# Patient Record
Sex: Male | Born: 1935 | Race: Black or African American | Hispanic: No | Marital: Married | Smoking: Former smoker
Health system: Southern US, Community
[De-identification: ages and names within clinical notes are randomized; demographics above are authoritative.]

## PROBLEM LIST (undated history)

## (undated) DIAGNOSIS — H43393 Other vitreous opacities, bilateral: Secondary | ICD-10-CM

## (undated) DIAGNOSIS — I252 Old myocardial infarction: Secondary | ICD-10-CM

## (undated) DIAGNOSIS — I219 Acute myocardial infarction, unspecified: Secondary | ICD-10-CM

## (undated) DIAGNOSIS — M898X9 Other specified disorders of bone, unspecified site: Secondary | ICD-10-CM

## (undated) DIAGNOSIS — H52203 Unspecified astigmatism, bilateral: Secondary | ICD-10-CM

## (undated) DIAGNOSIS — C61 Malignant neoplasm of prostate: Secondary | ICD-10-CM

## (undated) DIAGNOSIS — E1122 Type 2 diabetes mellitus with diabetic chronic kidney disease: Secondary | ICD-10-CM

## (undated) DIAGNOSIS — H02831 Dermatochalasis of right upper eyelid: Secondary | ICD-10-CM

## (undated) DIAGNOSIS — H5203 Hypermetropia, bilateral: Secondary | ICD-10-CM

## (undated) DIAGNOSIS — M9080 Osteopathy in diseases classified elsewhere, unspecified site: Secondary | ICD-10-CM

## (undated) DIAGNOSIS — K219 Gastro-esophageal reflux disease without esophagitis: Secondary | ICD-10-CM

## (undated) DIAGNOSIS — N183 Chronic kidney disease, stage 3 unspecified: Secondary | ICD-10-CM

## (undated) DIAGNOSIS — E559 Vitamin D deficiency, unspecified: Secondary | ICD-10-CM

## (undated) DIAGNOSIS — E876 Hypokalemia: Secondary | ICD-10-CM

## (undated) DIAGNOSIS — I6350 Cerebral infarction due to unspecified occlusion or stenosis of unspecified cerebral artery: Secondary | ICD-10-CM

## (undated) DIAGNOSIS — E0590 Thyrotoxicosis, unspecified without thyrotoxic crisis or storm: Secondary | ICD-10-CM

## (undated) DIAGNOSIS — I10 Essential (primary) hypertension: Secondary | ICD-10-CM

## (undated) DIAGNOSIS — H2513 Age-related nuclear cataract, bilateral: Secondary | ICD-10-CM

## (undated) DIAGNOSIS — I129 Hypertensive chronic kidney disease with stage 1 through stage 4 chronic kidney disease, or unspecified chronic kidney disease: Secondary | ICD-10-CM

## (undated) DIAGNOSIS — I4891 Unspecified atrial fibrillation: Secondary | ICD-10-CM

## (undated) DIAGNOSIS — E0520 Thyrotoxicosis with toxic multinodular goiter without thyrotoxic crisis or storm: Secondary | ICD-10-CM

## (undated) DIAGNOSIS — I2510 Atherosclerotic heart disease of native coronary artery without angina pectoris: Secondary | ICD-10-CM

## (undated) DIAGNOSIS — H524 Presbyopia: Secondary | ICD-10-CM

## (undated) DIAGNOSIS — Z85048 Personal history of other malignant neoplasm of rectum, rectosigmoid junction, and anus: Secondary | ICD-10-CM

## (undated) DIAGNOSIS — I519 Heart disease, unspecified: Secondary | ICD-10-CM

## (undated) DIAGNOSIS — E1165 Type 2 diabetes mellitus with hyperglycemia: Secondary | ICD-10-CM

## (undated) DIAGNOSIS — E785 Hyperlipidemia, unspecified: Secondary | ICD-10-CM

## (undated) DIAGNOSIS — H02834 Dermatochalasis of left upper eyelid: Secondary | ICD-10-CM

## (undated) DIAGNOSIS — E889 Metabolic disorder, unspecified: Secondary | ICD-10-CM

## (undated) DIAGNOSIS — H25013 Cortical age-related cataract, bilateral: Secondary | ICD-10-CM

## (undated) DIAGNOSIS — I5023 Acute on chronic systolic (congestive) heart failure: Secondary | ICD-10-CM

## (undated) DIAGNOSIS — G459 Transient cerebral ischemic attack, unspecified: Secondary | ICD-10-CM

## (undated) DIAGNOSIS — D631 Anemia in chronic kidney disease: Secondary | ICD-10-CM

## (undated) HISTORY — DX: Metabolic disorder, unspecified: E88.9

## (undated) HISTORY — DX: Essential (primary) hypertension: I10

## (undated) HISTORY — DX: Chronic kidney disease, stage 3 (moderate): N18.3

## (undated) HISTORY — DX: Osteopathy in diseases classified elsewhere, unspecified site: M90.80

## (undated) HISTORY — DX: Old myocardial infarction: I25.2

## (undated) HISTORY — DX: Other vitreous opacities, bilateral: H43.393

## (undated) HISTORY — DX: Hypercalcemia: E83.52

## (undated) HISTORY — DX: Atherosclerotic heart disease of native coronary artery without angina pectoris: I25.10

## (undated) HISTORY — DX: Thyrotoxicosis with toxic multinodular goiter without thyrotoxic crisis or storm: E05.20

## (undated) HISTORY — DX: Anemia in chronic kidney disease: D63.1

## (undated) HISTORY — PX: GASTROSTOMY TUBE PLACEMENT: SHX655

## (undated) HISTORY — DX: Hypokalemia: E87.6

## (undated) HISTORY — DX: Hypermetropia, bilateral: H52.203

## (undated) HISTORY — DX: Hyperlipidemia, unspecified: E78.5

## (undated) HISTORY — PX: COLOPROCTECTOMY: SHX919

## (undated) HISTORY — DX: Unspecified atrial fibrillation: I48.91

## (undated) HISTORY — PX: CORONARY ARTERY BYPASS GRAFT: SHX141

## (undated) HISTORY — DX: Type 2 diabetes mellitus with diabetic chronic kidney disease: E11.22

## (undated) HISTORY — DX: Cerebral infarction due to unspecified occlusion or stenosis of unspecified cerebral artery: I63.50

## (undated) HISTORY — DX: Cortical age-related cataract, bilateral: H25.013

## (undated) HISTORY — DX: Gastro-esophageal reflux disease without esophagitis: K21.9

## (undated) HISTORY — DX: Heart disease, unspecified: I51.9

## (undated) HISTORY — DX: Presbyopia: H52.4

## (undated) HISTORY — DX: Personal history of other malignant neoplasm of rectum, rectosigmoid junction, and anus: Z85.048

## (undated) HISTORY — DX: Vitamin D deficiency, unspecified: E55.9

## (undated) HISTORY — DX: Chronic kidney disease, stage 3 unspecified: N18.30

## (undated) HISTORY — DX: Type 2 diabetes mellitus with hyperglycemia: E11.65

## (undated) HISTORY — DX: Dermatochalasis of right upper eyelid: H02.831

## (undated) HISTORY — DX: Acute on chronic systolic (congestive) heart failure: I50.23

## (undated) HISTORY — DX: Hypermetropia, bilateral: H52.03

## (undated) HISTORY — DX: Malignant neoplasm of prostate: C61

## (undated) HISTORY — DX: Transient cerebral ischemic attack, unspecified: G45.9

## (undated) HISTORY — PX: CARDIAC SURGERY: SHX584

## (undated) HISTORY — DX: Hypertensive chronic kidney disease with stage 1 through stage 4 chronic kidney disease, or unspecified chronic kidney disease: I12.9

## (undated) HISTORY — DX: Age-related nuclear cataract, bilateral: H25.13

## (undated) HISTORY — DX: Dermatochalasis of left upper eyelid: H02.834

## (undated) HISTORY — DX: Acute myocardial infarction, unspecified: I21.9

## (undated) HISTORY — PX: TONSILLECTOMY: SUR1361

## (undated) HISTORY — DX: Other specified disorders of bone, unspecified site: M89.8X9

## (undated) HISTORY — PX: INGUINAL HERNIA REPAIR: SUR1180

## (undated) HISTORY — DX: Thyrotoxicosis, unspecified without thyrotoxic crisis or storm: E05.90

---

## 2018-03-14 ENCOUNTER — Non-Acute Institutional Stay (SKILLED_NURSING_FACILITY): Payer: Medicare Other | Admitting: Internal Medicine

## 2018-03-14 ENCOUNTER — Encounter: Payer: Self-pay | Admitting: Internal Medicine

## 2018-03-14 DIAGNOSIS — E1122 Type 2 diabetes mellitus with diabetic chronic kidney disease: Secondary | ICD-10-CM

## 2018-03-14 DIAGNOSIS — R531 Weakness: Secondary | ICD-10-CM

## 2018-03-14 DIAGNOSIS — E785 Hyperlipidemia, unspecified: Secondary | ICD-10-CM

## 2018-03-14 DIAGNOSIS — I6930 Unspecified sequelae of cerebral infarction: Secondary | ICD-10-CM

## 2018-03-14 DIAGNOSIS — I10 Essential (primary) hypertension: Secondary | ICD-10-CM

## 2018-03-14 DIAGNOSIS — E1169 Type 2 diabetes mellitus with other specified complication: Secondary | ICD-10-CM

## 2018-03-14 DIAGNOSIS — I482 Chronic atrial fibrillation, unspecified: Secondary | ICD-10-CM | POA: Diagnosis not present

## 2018-03-14 DIAGNOSIS — D72825 Bandemia: Secondary | ICD-10-CM

## 2018-03-14 DIAGNOSIS — E1159 Type 2 diabetes mellitus with other circulatory complications: Secondary | ICD-10-CM

## 2018-03-14 NOTE — Progress Notes (Addendum)
:  Location:  [facility] Room Number: 413K Place of Service:  SNF (31)  Merideth Bosque D. Sheppard Coil, MD  Patient Care Team: Hennie Duos, MD as PCP - General (Internal Medicine)  Extended Emergency Contact Information Primary Emergency Contact: marino, rogerson Mobile Phone: [phone] Relation: Spouse     Allergies: Patient has no known allergies.  Chief Complaint  Patient presents with   New Admit To SNF    Admit to [facility]    HPI: Patient is 85 y.o. 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  When patient got home he could not get out of his wheelchair and so returned again to the ED.  CT scans earlier had shown encephalomalacia and prior CVA.  Patient has dysarthric speech at baseline.  He had right upper extremity weakness and bilateral lower extremity weakness as baseline.  He denied worsening speech changes but did endorse lower extremity weakness.  Patient was admitted to [facility] from 3/5-10 for leukocytosis and worsening right-sided weakness.  Patient with a white count of WBC and possible UTI, treated with a full course of Rocephin despite the fact that urine culture did not grow out anything.  Worsening right-sided weakness was negative for acute stroke and daily MRI of the brain.  Patient did have acute on chronic metabolic encephalopathy which improved and.  All other problems been near are at their baseline patient is admitted to skilled nursing facility for OT/PT.  While at skilled nursing facility patient will be followed for chronic atrial fib treated with metoprolol and Xarelto as well as Cardizem, hypertension treated with diltiazem and Toprol-XL,  history of stroke treated with Xarelto and aspirin.  Past Medical History:  Diagnosis Date   Acute on chronic systolic (congestive) heart failure (HCC)    Anemia due to stage 3 chronic kidney disease (HCC)    Arteriosclerotic coronary artery disease    Atrial fibrillation (HCC)    Benign hypertension with chronic kidney disease, stage III (HCC)    Cerebral artery occlusion with cerebral infarction (HCC)    Cortical age-related cataract of both eyes    Dermatochalasis of both upper eyelids    Diabetes mellitus with stage 3 chronic kidney disease (HCC)    Gastroesophageal reflux disease without esophagitis    History of heart attack    Hypercalcemia    Hyperlipidemia    Hyperopia with astigmatism and presbyopia, bilateral    Hypertension with goal to be determined    Hyperthyroidism    Hypokalemia    LV dysfunction    Malignant neoplasm of prostate ([place])    Metabolic bone disease    Myocardial infarction [facility])    Nuclear sclerotic cataract of both eyes    Personal history of rectal cancer    Toxic multinodular goiter    Toxic multinodular goiter    Transient ischemic attack    Type 2 diabetes mellitus with hyperglycemia ([place])    Vitamin D deficiency    Vitreous floaters of both eyes       Allergies as of 03/14/2018   No Known Allergies     Medication List       Accurate as of March 14, 2018  3:11 PM. Always use your most  recent med list.        aspirin EC 81 MG tablet Take 81 mg by mouth daily.   atorvastatin 10 MG tablet Commonly known as:  LIPITOR Take 10 mg by mouth daily.   cholecalciferol 25 MCG (1000 UT) tablet Commonly known as:  VITAMIN D Take 1,000 Units by mouth daily.   diltiazem 120 MG tablet Commonly known as:  CARDIZEM Take 120 mg by mouth daily.   ferrous sulfate 325 (65 FE) MG tablet Take 325 mg by mouth daily with breakfast.   glipiZIDE 10 MG 24 hr tablet Commonly known as:  GLUCOTROL XL Take 10 mg by mouth  daily with breakfast.   insulin lispro 100 UNIT/ML injection Commonly known as:  HUMALOG Inject into the skin. Inject 2-12 units into the skin 3 (times) daily after meals.   metoprolol succinate 25 MG 24 hr tablet Commonly known as:  TOPROL-XL Take 25 mg by mouth daily.   pantoprazole 40 MG tablet Commonly known as:  PROTONIX Take 40 mg by mouth daily.   Xarelto 20 MG Tabs tablet Generic drug:  rivaroxaban Take 20 mg by mouth daily with supper.       No orders of the defined types were placed in this encounter.    There is no immunization history on file for this patient.  Social History   Tobacco Use   Smoking status: Former Smoker   Smokeless tobacco: Never Used  Substance Use Topics   Alcohol use: Not on file    Family history is   Family History  Problem Relation Age of Onset   Diabetes Sister    Stroke Sister    Hypertension Sister       Review of Systems  DATA OBTAINED: from patient, nurse GENERAL:  no fevers, fatigue, appetite changes SKIN: No itching, or rash EYES: No eye pain, redness, discharge EARS: No earache, tinnitus, change in hearing NOSE: No congestion, drainage or bleeding  MOUTH/THROAT: No mouth or tooth pain, No sore throat RESPIRATORY: No cough, wheezing, SOB CARDIAC: No chest pain, palpitations, lower extremity edema  GI: No abdominal pain, No N/V/D or constipation, No heartburn or reflux  GU: No dysuria, frequency or urgency, or incontinence  MUSCULOSKELETAL: No unrelieved bone/joint pain NEUROLOGIC: No headache, dizziness or focal weakness PSYCHIATRIC: No c/o anxiety or sadness   Vitals:   03/14/18 1457  BP: 138/81  Pulse: 75  Resp: 18  Temp: 99 F (37.2 C)    SpO2 Readings from Last 1 Encounters:  No data found for SpO2   Body mass index is 32.69 kg/m.     Physical Exam  GENERAL APPEARANCE: Alert, conversant,  No acute distress.  SKIN: No diaphoresis rash HEAD: Normocephalic, atraumatic  EYES:  Conjunctiva/lids clear. Pupils round, reactive. EOMs intact.  EARS: External exam WNL, canals clear. Hearing grossly normal.  NOSE: No deformity or discharge.  MOUTH/THROAT: Lips w/o lesions  RESPIRATORY: Breathing is even, unlabored. Lung sounds are clear   CARDIOVASCULAR: Heart irreg no murmurs, rubs or gallops. No peripheral edema.   GASTROINTESTINAL: Abdomen is soft, non-tender, not distended w/ normal bowel sounds. GENITOURINARY: Bladder non tender, not distended  MUSCULOSKELETAL: No abnormal joints or musculature NEUROLOGIC:  Cranial nerves 2-12 grossly intact except for dysarthria from prior stroke; strength; right-sided weakness PSYCHIATRIC: Mood and affect appropriate to situation, no behavioral issues  There are no active problems to display for this patient.     Labs reviewed: Basic Metabolic Panel: No results found for: NA, K, CL, CO2,  GLUCOSE, BUN, CREATININE, CALCIUM, PROT, ALBUMIN, AST, ALT, ALKPHOS, BILITOT, GFRNONAA, GFRAA  No results for input(s): NA, K, CL, CO2, GLUCOSE, BUN, CREATININE, CALCIUM, MG, PHOS in the last 8760 hours. Liver Function Tests: No results for input(s): AST, ALT, ALKPHOS, BILITOT, PROT, ALBUMIN in the last 8760 hours. No results for input(s): LIPASE, AMYLASE in the last 8760 hours. No results for input(s): AMMONIA in the last 8760 hours. CBC: No results for input(s): WBC, NEUTROABS, HGB, HCT, MCV, PLT in the last 8760 hours. Lipid No results for input(s): CHOL, HDL, LDLCALC, TRIG in the last 8760 hours.  Cardiac Enzymes: No results for input(s): CKTOTAL, CKMB, CKMBINDEX, TROPONINI in the last 8760 hours. BNP: No results for input(s): BNP in the last 8760 hours. No results found for: MICROALBUR No results found for: HGBA1C No results found for: TSH No results found for: VITAMINB12 No results found for: FOLATE No results found for: IRON, TIBC, FERRITIN  Imaging and Procedures obtained prior to SNF admission: Patient was never  admitted.   Not all labs, radiology exams or other studies done during hospitalization come through on my EPIC note; however they are reviewed by me.    Assessment and Plan  Worsening right-sided weakness/history of CVA- patient with known CVA in the past work-up was negative for acute stroke including MRI SNF- patient is admitted for OT/PT; continue Xarelto 20 mg daily and ASA 81 mg daily  Leukocytosis- WBC 18,000 in ED; patient was started on Rocephin empirically for possible UTI which did not grow in culture but Rocephin was continued for full course  Chronic atrial fibrillation SNF- continue diltiazem 120 mg daily metoprolol 25 mg daily and Xarelto 20 mg daily as prophylaxis  Hypertension SNF- continue diltiazem 120 mg daily, metoprolol 25 mg XL daily.  Diabetes mellitus patient was treated with insulin while in the hospital SNF- patient restarted on glipizide 10 mg daily sliding scale insulin for coverage; plan to DC if possible; patient is on statin  Hyperlipidemia SNF- not stated as uncontrolled; continue Lipitor 10 mg daily    Time spent greater than 45 minutes;> 50% of time with patient was spent reviewing records, labs, tests and studies, counseling and developing plan of care  Webb Silversmith D. Sheppard Coil, MD

## 2018-03-17 ENCOUNTER — Encounter: Payer: Self-pay | Admitting: Internal Medicine

## 2018-03-17 DIAGNOSIS — I4891 Unspecified atrial fibrillation: Secondary | ICD-10-CM | POA: Insufficient documentation

## 2018-03-17 DIAGNOSIS — E119 Type 2 diabetes mellitus without complications: Secondary | ICD-10-CM | POA: Insufficient documentation

## 2018-03-17 DIAGNOSIS — E1159 Type 2 diabetes mellitus with other circulatory complications: Secondary | ICD-10-CM | POA: Insufficient documentation

## 2018-03-17 DIAGNOSIS — I6930 Unspecified sequelae of cerebral infarction: Secondary | ICD-10-CM | POA: Insufficient documentation

## 2018-03-17 DIAGNOSIS — I10 Essential (primary) hypertension: Secondary | ICD-10-CM

## 2018-03-17 DIAGNOSIS — D72829 Elevated white blood cell count, unspecified: Secondary | ICD-10-CM | POA: Insufficient documentation

## 2018-03-17 DIAGNOSIS — R531 Weakness: Secondary | ICD-10-CM | POA: Insufficient documentation

## 2018-03-17 DIAGNOSIS — I482 Chronic atrial fibrillation, unspecified: Secondary | ICD-10-CM | POA: Insufficient documentation

## 2018-03-17 DIAGNOSIS — E785 Hyperlipidemia, unspecified: Secondary | ICD-10-CM

## 2018-03-17 DIAGNOSIS — E1169 Type 2 diabetes mellitus with other specified complication: Secondary | ICD-10-CM | POA: Insufficient documentation

## 2018-04-04 ENCOUNTER — Other Ambulatory Visit: Payer: Self-pay | Admitting: Adult Health

## 2018-04-04 ENCOUNTER — Non-Acute Institutional Stay (SKILLED_NURSING_FACILITY): Payer: Medicare Other | Admitting: Adult Health

## 2018-04-04 ENCOUNTER — Encounter: Payer: Self-pay | Admitting: Adult Health

## 2018-04-04 DIAGNOSIS — E1169 Type 2 diabetes mellitus with other specified complication: Secondary | ICD-10-CM | POA: Diagnosis not present

## 2018-04-04 DIAGNOSIS — E785 Hyperlipidemia, unspecified: Secondary | ICD-10-CM

## 2018-04-04 DIAGNOSIS — E1159 Type 2 diabetes mellitus with other circulatory complications: Secondary | ICD-10-CM

## 2018-04-04 DIAGNOSIS — R531 Weakness: Secondary | ICD-10-CM

## 2018-04-04 DIAGNOSIS — I10 Essential (primary) hypertension: Secondary | ICD-10-CM

## 2018-04-04 DIAGNOSIS — I482 Chronic atrial fibrillation, unspecified: Secondary | ICD-10-CM | POA: Diagnosis not present

## 2018-04-04 DIAGNOSIS — E1122 Type 2 diabetes mellitus with diabetic chronic kidney disease: Secondary | ICD-10-CM

## 2018-04-04 MED ORDER — GLIPIZIDE ER 10 MG PO TB24: 10.0000 mg | ORAL_TABLET | Freq: Every day | ORAL | 0 refills | Status: DC

## 2018-04-04 MED ORDER — FERROUS SULFATE 325 (65 FE) MG PO TABS: 325.0000 mg | ORAL_TABLET | Freq: Every day | ORAL | 0 refills | Status: DC

## 2018-04-04 MED ORDER — RIVAROXABAN 20 MG PO TABS: 20.0000 mg | ORAL_TABLET | Freq: Every day | ORAL | 0 refills | Status: DC

## 2018-04-04 MED ORDER — INSULIN LISPRO 100 UNIT/ML ~~LOC~~ SOLN: 3.0000 [IU] | Freq: Three times a day (TID) | SUBCUTANEOUS | 0 refills | Status: DC

## 2018-04-04 MED ORDER — DILTIAZEM HCL 120 MG PO TABS: 120.0000 mg | ORAL_TABLET | Freq: Every day | ORAL | 0 refills | Status: DC

## 2018-04-04 MED ORDER — METOPROLOL SUCCINATE ER 25 MG PO TB24: 25.0000 mg | ORAL_TABLET | Freq: Every day | ORAL | 0 refills | Status: DC

## 2018-04-04 MED ORDER — ATORVASTATIN CALCIUM 10 MG PO TABS: 10.0000 mg | ORAL_TABLET | Freq: Every day | ORAL | 0 refills | Status: AC

## 2018-04-04 NOTE — Progress Notes (Signed)
Location:    [facility] Room Number: 229N Place of Service:  SNF (31)    CODE STATUS: 102P  No Known Allergies  Chief Complaint  Patient presents with  . Discharge Note    Discharge from [facility]     HPI:   He is being discharged to home with home health for pt/ot/rn. He will need a front wheel walker. He will need his prescriptions written and will need to follow up with her medical provider. He had been hospitalized for worsening weakness. He was admitted to this facility for short term rehab. He is read to complete his therapy on a home health basis.    Past Medical History:  Diagnosis Date  . Acute on chronic systolic (congestive) heart failure ([place])   . Anemia due to stage 3 chronic kidney disease ([place])   . Arteriosclerotic coronary artery disease   . Atrial fibrillation ([place])   . Benign hypertension with chronic kidney disease, stage III ([place])   . Cerebral artery occlusion with cerebral infarction ([place])   . Cortical age-related cataract of both eyes   . Dermatochalasis of both upper eyelids   . Diabetes mellitus with stage 3 chronic kidney disease ([place])   . Gastroesophageal reflux disease without esophagitis   . History of heart attack   . Hypercalcemia   . Hyperlipidemia   . Hyperopia with astigmatism and presbyopia, bilateral   . Hypertension with goal to be determined   . Hyperthyroidism   . Hypokalemia   . LV dysfunction   . Malignant neoplasm of prostate ([place])   . Metabolic bone disease   . Myocardial infarction ([place])   . Nuclear sclerotic cataract of both eyes   . Personal history of rectal cancer   . Toxic multinodular goiter   . Toxic multinodular goiter   . Transient ischemic attack   . Type 2 diabetes mellitus with hyperglycemia ([place])   . Vitamin D deficiency   . Vitreous floaters of both eyes     Past Surgical History:  Procedure Laterality Date  . CARDIAC SURGERY    . COLOPROCTECTOMY    . CORONARY  ARTERY BYPASS GRAFT    . GASTROSTOMY TUBE PLACEMENT    . INGUINAL HERNIA REPAIR    . TONSILLECTOMY      Social History   Socioeconomic History  . Marital status: Married    Spouse name: Not on file  . Number of children: Not on file  . Years of education: Not on file  . Highest education level: Not on file  Occupational History  . Not on file  Social Needs  . Financial resource strain: Not on file  . Food insecurity:    Worry: Not on file    Inability: Not on file  . Transportation needs:    Medical: Not on file    Non-medical: Not on file  Tobacco Use  . Smoking status: Former Research scientist (life sciences)  . Smokeless tobacco: Never Used  Substance and Sexual Activity  . Alcohol use: Not on file  . Drug use: Not on file  . Sexual activity: Not on file  Lifestyle  . Physical activity:    Days per week: Not on file    Minutes per session: Not on file  . Stress: Not on file  Relationships  . Social connections:    Talks on phone: Not on file    Gets together: Not on file    Attends religious service: Not on  file    Active member of club or organization: Not on file    Attends meetings of clubs or organizations: Not on file    Relationship status: Not on file  . Intimate partner violence:    Fear of current or ex partner: Not on file    Emotionally abused: Not on file    Physically abused: Not on file    Forced sexual activity: Not on file  Other Topics Concern  . Not on file  Social History Narrative  . Not on file   Family History  Problem Relation Age of Onset  . Diabetes Sister   . Stroke Sister   . Hypertension Sister     VITAL SIGNS BP 134/64   Pulse 64   Temp 98 F (36.7 C)   Resp 18   Ht 6' (1.829 m)   Wt 241 lb (109.3 kg)   BMI 32.69 kg/m   Patient's Medications  New Prescriptions   No medications on file  Previous Medications   ASPIRIN EC 81 MG TABLET    Take 81 mg by mouth daily.   ATORVASTATIN (LIPITOR) 10 MG TABLET    Take 1 tablet (10 mg total) by  mouth daily.   CHOLECALCIFEROL (VITAMIN D) 25 MCG (1000 UT) TABLET    Take 1,000 Units by mouth daily.   DILTIAZEM (CARDIZEM) 120 MG TABLET    Take 1 tablet (120 mg total) by mouth daily.   FERROUS SULFATE 325 (65 FE) MG TABLET    Take 1 tablet (325 mg total) by mouth daily with breakfast.   GLIPIZIDE (GLUCOTROL XL) 10 MG 24 HR TABLET    Take 1 tablet (10 mg total) by mouth daily with breakfast.   INSULIN LISPRO (HUMALOG) 100 UNIT/ML INJECTION    Inject 0.03-0.15 mLs (3-15 Units total) into the skin 3 (three) times daily with meals. SSI: 151-200: 3 units; 201-250: 5 units; 251-300: 8 units; 301-350: 11 units; 351-400: 15 units   METOPROLOL SUCCINATE (TOPROL-XL) 25 MG 24 HR TABLET    Take 1 tablet (25 mg total) by mouth daily.   PANTOPRAZOLE (PROTONIX) 40 MG TABLET    Take 40 mg by mouth daily.   RIVAROXABAN (XARELTO) 20 MG TABS TABLET    Take 1 tablet (20 mg total) by mouth daily with supper.  Modified Medications   No medications on file  Discontinued Medications   No medications on file     SIGNIFICANT DIAGNOSTIC EXAMS  NONE RECENT.   Review of Systems  Constitutional: Negative for malaise/fatigue.  Respiratory: Negative for cough and shortness of breath.   Cardiovascular: Negative for chest pain, palpitations and leg swelling.  Gastrointestinal: Negative for abdominal pain, constipation and heartburn.  Musculoskeletal: Negative for back pain, joint pain and myalgias.  Skin: Negative.   Neurological: Negative for dizziness.  Psychiatric/Behavioral: The patient is not nervous/anxious.    Physical Exam Constitutional:      General: He is not in acute distress.    Appearance: He is well-developed. He is not diaphoretic.  Neck:     Musculoskeletal: Neck supple.     Thyroid: No thyromegaly.  Cardiovascular:     Rate and Rhythm: Normal rate and regular rhythm.     Pulses: Normal pulses.     Heart sounds: Normal heart sounds.  Pulmonary:     Effort: Pulmonary effort is normal. No  respiratory distress.     Breath sounds: Normal breath sounds.  Abdominal:     General: Bowel sounds are normal.  There is no distension.     Palpations: Abdomen is soft.     Tenderness: There is no abdominal tenderness.  Musculoskeletal:     Right lower leg: No edema.     Left lower leg: No edema.     Comments: Is able to move all extremities Has right side weakness   Lymphadenopathy:     Cervical: No cervical adenopathy.  Skin:    General: Skin is warm and dry.  Neurological:     Mental Status: He is alert. Mental status is at baseline.     Comments: Dysarthria   Psychiatric:        Mood and Affect: Mood normal.      ASSESSMENT/ PLAN:  Patient is being discharged with the following home health services: pt/ot/rn: to evaluate and treat as indicated for gait balance strength adl training medication management.    Patient is being discharged with the following durable medical equipment:  Front wheel walker to allow him to maintain his current level of independence with his adls.    Patient has been advised to f/u with their PCP in 1-2 weeks to bring them up to date on their rehab stay.  Social services at facility was responsible for arranging this appointment.  Pt was provided with a 30 day supply of prescriptions for medications and refills must be obtained from their PCP.  For controlled substances, a more limited supply may be provided adequate until PCP appointment only.  A 30 day supply of his medications have been sent to [facility] in [place] [address].   Time spent with patient and discharge process: 40 minutes: home health; dme and medications.     Ok Edwards NP [facility]  Contact [phone] Monday through Friday 8am- 5pm  After hours call [phone]

## 2020-05-30 ENCOUNTER — Encounter (HOSPITAL_COMMUNITY): Payer: Self-pay

## 2020-05-30 ENCOUNTER — Observation Stay (HOSPITAL_COMMUNITY): Payer: Medicare Other

## 2020-05-30 ENCOUNTER — Emergency Department (HOSPITAL_COMMUNITY): Payer: Medicare Other

## 2020-05-30 ENCOUNTER — Inpatient Hospital Stay (HOSPITAL_COMMUNITY)
Admission: EM | Admit: 2020-05-30 | Discharge: 2020-06-03 | DRG: 064 | Disposition: A | Discharge status: Rehabilitation Facility | Payer: Medicare Other | Attending: Internal Medicine | Admitting: Internal Medicine

## 2020-05-30 ENCOUNTER — Other Ambulatory Visit: Payer: Self-pay

## 2020-05-30 DIAGNOSIS — Z8546 Personal history of malignant neoplasm of prostate: Secondary | ICD-10-CM

## 2020-05-30 DIAGNOSIS — H02834 Dermatochalasis of left upper eyelid: Secondary | ICD-10-CM | POA: Diagnosis present

## 2020-05-30 DIAGNOSIS — R531 Weakness: Secondary | ICD-10-CM

## 2020-05-30 DIAGNOSIS — K219 Gastro-esophageal reflux disease without esophagitis: Secondary | ICD-10-CM | POA: Diagnosis present

## 2020-05-30 DIAGNOSIS — I4891 Unspecified atrial fibrillation: Secondary | ICD-10-CM

## 2020-05-30 DIAGNOSIS — I255 Ischemic cardiomyopathy: Secondary | ICD-10-CM | POA: Diagnosis present

## 2020-05-30 DIAGNOSIS — E8889 Other specified metabolic disorders: Secondary | ICD-10-CM | POA: Diagnosis present

## 2020-05-30 DIAGNOSIS — R4781 Slurred speech: Secondary | ICD-10-CM | POA: Diagnosis present

## 2020-05-30 DIAGNOSIS — D631 Anemia in chronic kidney disease: Secondary | ICD-10-CM | POA: Diagnosis present

## 2020-05-30 DIAGNOSIS — Z79899 Other long term (current) drug therapy: Secondary | ICD-10-CM

## 2020-05-30 DIAGNOSIS — Z933 Colostomy status: Secondary | ICD-10-CM

## 2020-05-30 DIAGNOSIS — R270 Ataxia, unspecified: Secondary | ICD-10-CM | POA: Diagnosis present

## 2020-05-30 DIAGNOSIS — R29709 NIHSS score 9: Secondary | ICD-10-CM | POA: Diagnosis present

## 2020-05-30 DIAGNOSIS — I6389 Other cerebral infarction: Secondary | ICD-10-CM | POA: Diagnosis not present

## 2020-05-30 DIAGNOSIS — E785 Hyperlipidemia, unspecified: Secondary | ICD-10-CM | POA: Diagnosis present

## 2020-05-30 DIAGNOSIS — Z8249 Family history of ischemic heart disease and other diseases of the circulatory system: Secondary | ICD-10-CM

## 2020-05-30 DIAGNOSIS — Z85048 Personal history of other malignant neoplasm of rectum, rectosigmoid junction, and anus: Secondary | ICD-10-CM

## 2020-05-30 DIAGNOSIS — Z823 Family history of stroke: Secondary | ICD-10-CM

## 2020-05-30 DIAGNOSIS — E669 Obesity, unspecified: Secondary | ICD-10-CM | POA: Diagnosis present

## 2020-05-30 DIAGNOSIS — I63449 Cerebral infarction due to embolism of unspecified cerebellar artery: Secondary | ICD-10-CM | POA: Diagnosis present

## 2020-05-30 DIAGNOSIS — E1122 Type 2 diabetes mellitus with diabetic chronic kidney disease: Secondary | ICD-10-CM | POA: Diagnosis present

## 2020-05-30 DIAGNOSIS — H02831 Dermatochalasis of right upper eyelid: Secondary | ICD-10-CM | POA: Diagnosis present

## 2020-05-30 DIAGNOSIS — I639 Cerebral infarction, unspecified: Secondary | ICD-10-CM | POA: Diagnosis present

## 2020-05-30 DIAGNOSIS — E039 Hypothyroidism, unspecified: Secondary | ICD-10-CM | POA: Diagnosis present

## 2020-05-30 DIAGNOSIS — R29810 Facial weakness: Secondary | ICD-10-CM | POA: Diagnosis present

## 2020-05-30 DIAGNOSIS — I5030 Unspecified diastolic (congestive) heart failure: Secondary | ICD-10-CM

## 2020-05-30 DIAGNOSIS — H25813 Combined forms of age-related cataract, bilateral: Secondary | ICD-10-CM | POA: Diagnosis present

## 2020-05-30 DIAGNOSIS — I495 Sick sinus syndrome: Secondary | ICD-10-CM | POA: Diagnosis present

## 2020-05-30 DIAGNOSIS — I4819 Other persistent atrial fibrillation: Secondary | ICD-10-CM | POA: Diagnosis present

## 2020-05-30 DIAGNOSIS — Z951 Presence of aortocoronary bypass graft: Secondary | ICD-10-CM

## 2020-05-30 DIAGNOSIS — Z7982 Long term (current) use of aspirin: Secondary | ICD-10-CM

## 2020-05-30 DIAGNOSIS — I2510 Atherosclerotic heart disease of native coronary artery without angina pectoris: Secondary | ICD-10-CM | POA: Diagnosis present

## 2020-05-30 DIAGNOSIS — L8990 Pressure ulcer of unspecified site, unspecified stage: Secondary | ICD-10-CM | POA: Insufficient documentation

## 2020-05-30 DIAGNOSIS — I130 Hypertensive heart and chronic kidney disease with heart failure and stage 1 through stage 4 chronic kidney disease, or unspecified chronic kidney disease: Secondary | ICD-10-CM | POA: Diagnosis present

## 2020-05-30 DIAGNOSIS — I2721 Secondary pulmonary arterial hypertension: Secondary | ICD-10-CM | POA: Diagnosis present

## 2020-05-30 DIAGNOSIS — I5032 Chronic diastolic (congestive) heart failure: Secondary | ICD-10-CM | POA: Diagnosis present

## 2020-05-30 DIAGNOSIS — I63412 Cerebral infarction due to embolism of left middle cerebral artery: Principal | ICD-10-CM | POA: Diagnosis present

## 2020-05-30 DIAGNOSIS — I6329 Cerebral infarction due to unspecified occlusion or stenosis of other precerebral arteries: Secondary | ICD-10-CM | POA: Diagnosis present

## 2020-05-30 DIAGNOSIS — I252 Old myocardial infarction: Secondary | ICD-10-CM

## 2020-05-30 DIAGNOSIS — R1312 Dysphagia, oropharyngeal phase: Secondary | ICD-10-CM | POA: Diagnosis present

## 2020-05-30 DIAGNOSIS — R471 Dysarthria and anarthria: Secondary | ICD-10-CM | POA: Diagnosis present

## 2020-05-30 DIAGNOSIS — E559 Vitamin D deficiency, unspecified: Secondary | ICD-10-CM | POA: Diagnosis present

## 2020-05-30 DIAGNOSIS — Z833 Family history of diabetes mellitus: Secondary | ICD-10-CM

## 2020-05-30 DIAGNOSIS — N1832 Chronic kidney disease, stage 3b: Secondary | ICD-10-CM | POA: Diagnosis present

## 2020-05-30 DIAGNOSIS — Z20822 Contact with and (suspected) exposure to covid-19: Secondary | ICD-10-CM | POA: Diagnosis present

## 2020-05-30 DIAGNOSIS — Z6831 Body mass index (BMI) 31.0-31.9, adult: Secondary | ICD-10-CM

## 2020-05-30 DIAGNOSIS — Z7901 Long term (current) use of anticoagulants: Secondary | ICD-10-CM

## 2020-05-30 DIAGNOSIS — G8191 Hemiplegia, unspecified affecting right dominant side: Secondary | ICD-10-CM | POA: Diagnosis present

## 2020-05-30 DIAGNOSIS — Z87891 Personal history of nicotine dependence: Secondary | ICD-10-CM

## 2020-05-30 DIAGNOSIS — L89152 Pressure ulcer of sacral region, stage 2: Secondary | ICD-10-CM | POA: Diagnosis present

## 2020-05-30 DIAGNOSIS — Z794 Long term (current) use of insulin: Secondary | ICD-10-CM

## 2020-05-30 DIAGNOSIS — I472 Ventricular tachycardia: Secondary | ICD-10-CM | POA: Diagnosis not present

## 2020-05-30 LAB — COMPREHENSIVE METABOLIC PANEL
ALT: 21 U/L (ref 0–44)
AST: 24 U/L (ref 15–41)
Albumin: 3.3 g/dL — ABNORMAL LOW (ref 3.5–5.0)
Alkaline Phosphatase: 64 U/L (ref 38–126)
Anion gap: 7 (ref 5–15)
BUN: 24 mg/dL — ABNORMAL HIGH (ref 8–23)
CO2: 25 mmol/L (ref 22–32)
Calcium: 8.8 mg/dL — ABNORMAL LOW (ref 8.9–10.3)
Chloride: 105 mmol/L (ref 98–111)
Creatinine, Ser: 1.77 mg/dL — ABNORMAL HIGH (ref 0.61–1.24)
GFR, Estimated: 36 mL/min — ABNORMAL LOW (ref >60)
Glucose, Bld: 133 mg/dL — ABNORMAL HIGH (ref 70–99)
Potassium: 4.5 mmol/L (ref 3.5–5.1)
Sodium: 137 mmol/L (ref 135–145)
Total Bilirubin: 1 mg/dL (ref 0.3–1.2)
Total Protein: 7.7 g/dL (ref 6.5–8.1)

## 2020-05-30 LAB — CBG MONITORING, ED: Glucose-Capillary: 117 mg/dL — ABNORMAL HIGH (ref 70–99)

## 2020-05-30 LAB — CBC
HCT: 46 % (ref 39.0–52.0)
Hemoglobin: 14.3 g/dL (ref 13.0–17.0)
MCH: 28.1 pg (ref 26.0–34.0)
MCHC: 31.1 g/dL (ref 30.0–36.0)
MCV: 90.6 fL (ref 80.0–100.0)
Platelets: 190 10*3/uL (ref 150–400)
RBC: 5.08 MIL/uL (ref 4.22–5.81)
RDW: 14.4 % (ref 11.5–15.5)
WBC: 11.3 10*3/uL — ABNORMAL HIGH (ref 4.0–10.5)
nRBC: 0 % (ref 0.0–0.2)

## 2020-05-30 LAB — PROTIME-INR
INR: 1.6 — ABNORMAL HIGH (ref 0.8–1.2)
Prothrombin Time: 19 seconds — ABNORMAL HIGH (ref 11.4–15.2)

## 2020-05-30 LAB — URINALYSIS, ROUTINE W REFLEX MICROSCOPIC
Bacteria, UA: NONE SEEN
Bilirubin Urine: NEGATIVE
Glucose, UA: >=500 mg/dL — AB
Ketones, ur: NEGATIVE mg/dL
Leukocytes,Ua: NEGATIVE
Nitrite: NEGATIVE
Protein, ur: NEGATIVE mg/dL
Specific Gravity, Urine: 1.012 (ref 1.005–1.030)
pH: 8 (ref 5.0–8.0)

## 2020-05-30 LAB — I-STAT CHEM 8, ED
BUN: 31 mg/dL — ABNORMAL HIGH (ref 8–23)
Calcium, Ion: 1.09 mmol/L — ABNORMAL LOW (ref 1.15–1.40)
Chloride: 107 mmol/L (ref 98–111)
Creatinine, Ser: 1.8 mg/dL — ABNORMAL HIGH (ref 0.61–1.24)
Glucose, Bld: 130 mg/dL — ABNORMAL HIGH (ref 70–99)
HCT: 44 % (ref 39.0–52.0)
Hemoglobin: 15 g/dL (ref 13.0–17.0)
Potassium: 4.4 mmol/L (ref 3.5–5.1)
Sodium: 140 mmol/L (ref 135–145)
TCO2: 26 mmol/L (ref 22–32)

## 2020-05-30 LAB — DIFFERENTIAL
Abs Immature Granulocytes: 0.04 10*3/uL (ref 0.00–0.07)
Basophils Absolute: 0.1 10*3/uL (ref 0.0–0.1)
Basophils Relative: 0 %
Eosinophils Absolute: 0.4 10*3/uL (ref 0.0–0.5)
Eosinophils Relative: 4 %
Immature Granulocytes: 0 %
Lymphocytes Relative: 37 %
Lymphs Abs: 4.1 10*3/uL — ABNORMAL HIGH (ref 0.7–4.0)
Monocytes Absolute: 0.6 10*3/uL (ref 0.1–1.0)
Monocytes Relative: 6 %
Neutro Abs: 6 10*3/uL (ref 1.7–7.7)
Neutrophils Relative %: 53 %

## 2020-05-30 LAB — ECHOCARDIOGRAM COMPLETE
Area-P 1/2: 3.99 cm2
S' Lateral: 4.3 cm

## 2020-05-30 LAB — APTT: aPTT: 38 seconds — ABNORMAL HIGH (ref 24–36)

## 2020-05-30 IMAGING — CT CT HEAD CODE STROKE
4 series · 15 of 47 positions shown, 17 images · non-contrast
Comparison: None available

CLINICAL DATA: Code stroke.

EXAM:
CT HEAD WITHOUT CONTRAST
TECHNIQUE: Contiguous axial images were obtained from the base of the skull
through the vertex without intravenous contrast.

[Series 3: head wo · axial · 0.43mm/px · z∈[-124,-4]mm · 7 of 32 slices shown, 9 images]
[im 4/32  brain]
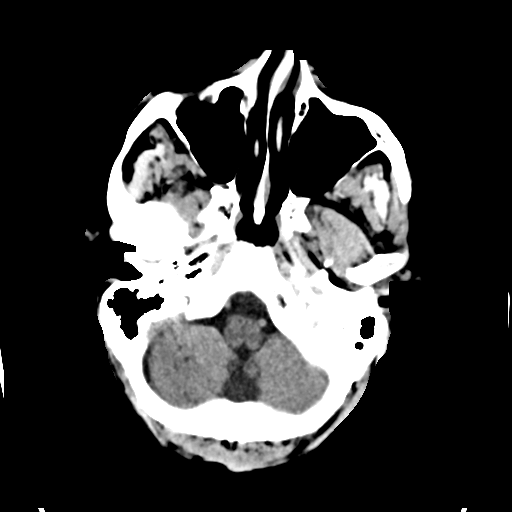
[im 4/32  bone]
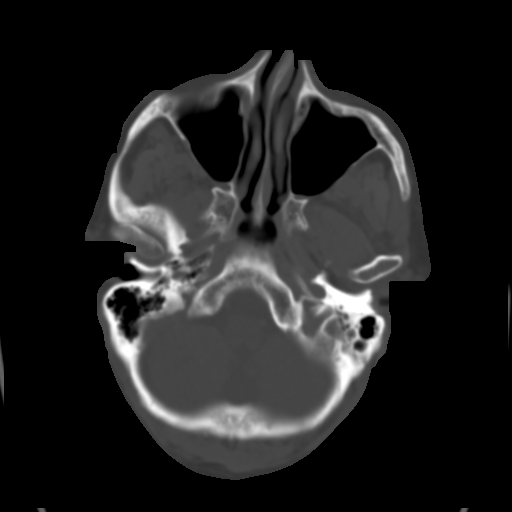
[im 8/32  brain]
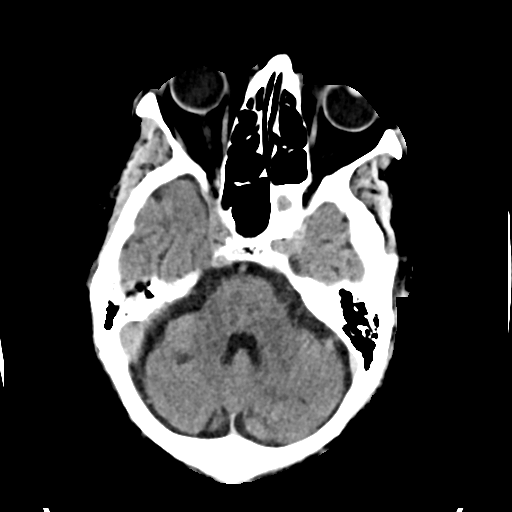
[im 12/32  brain]
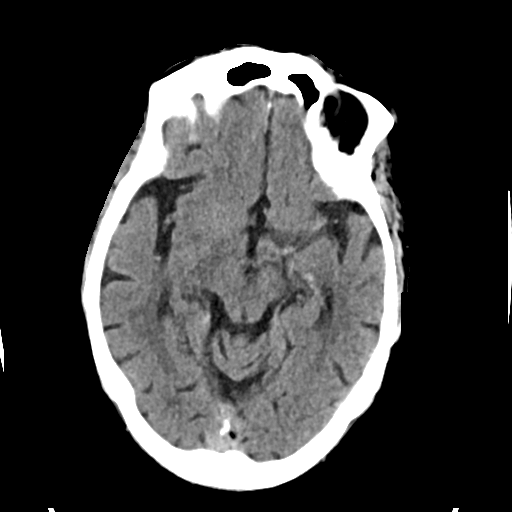
[im 16/32  brain]
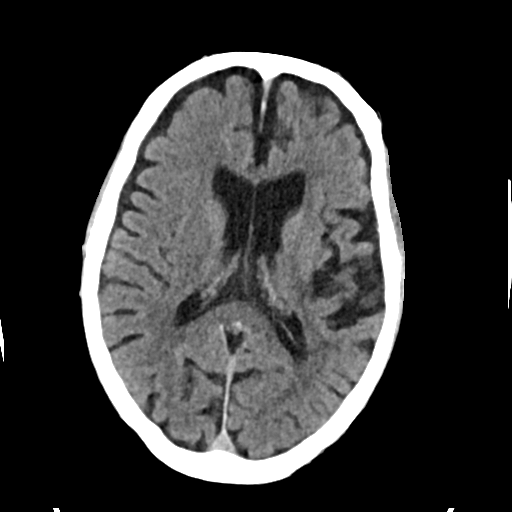
[im 20/32  brain]
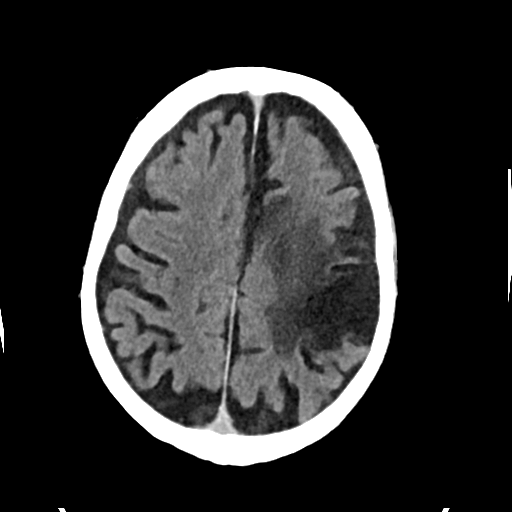
[im 20/32  bone]
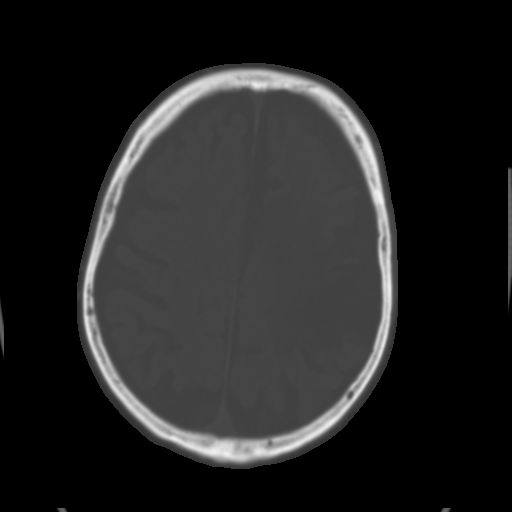
[im 24/32  brain]
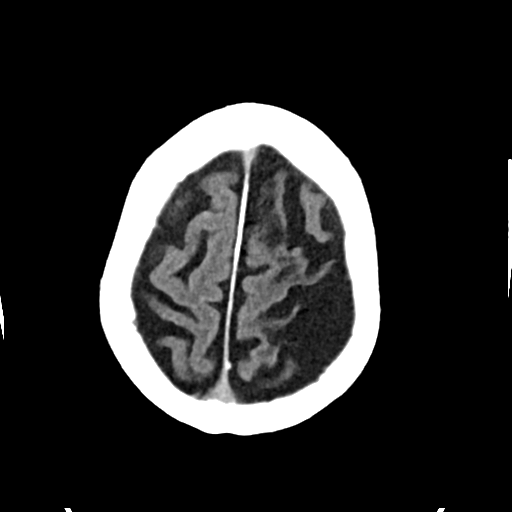
[im 28/32  brain]
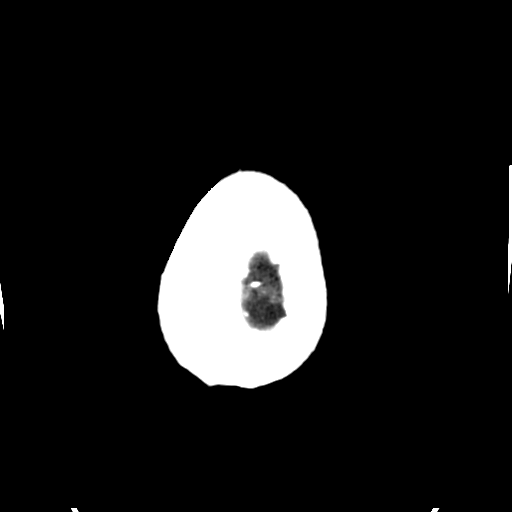

[Series 4: head bone · axial · 0.43mm/px · z∈[-124,-108]mm · 2 of 79 slices shown]
[im 8/79  bone]
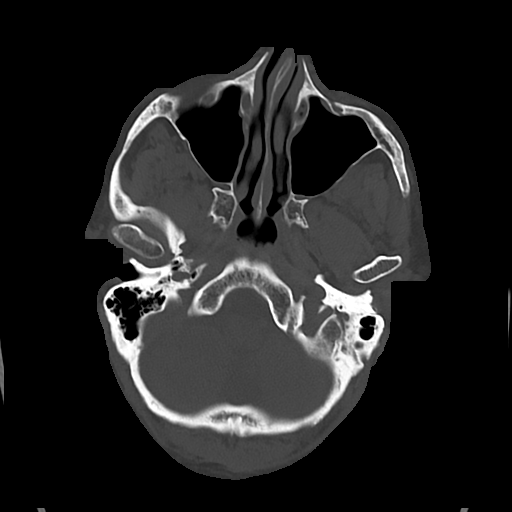
[im 16/79  bone]
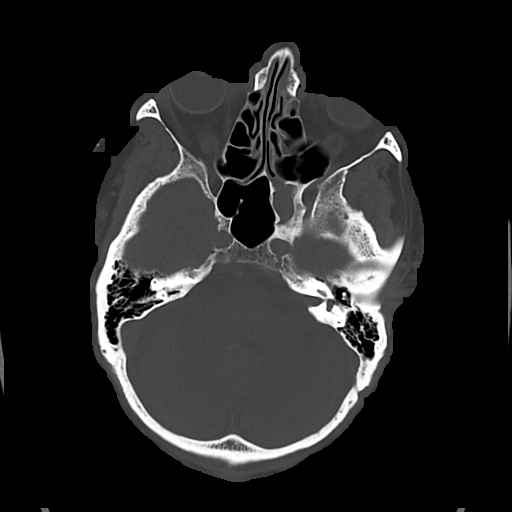

[Series 5: cor soft · coronal · 0.29mm/px · 3 of 73 slices shown]
[im 25/73  brain]
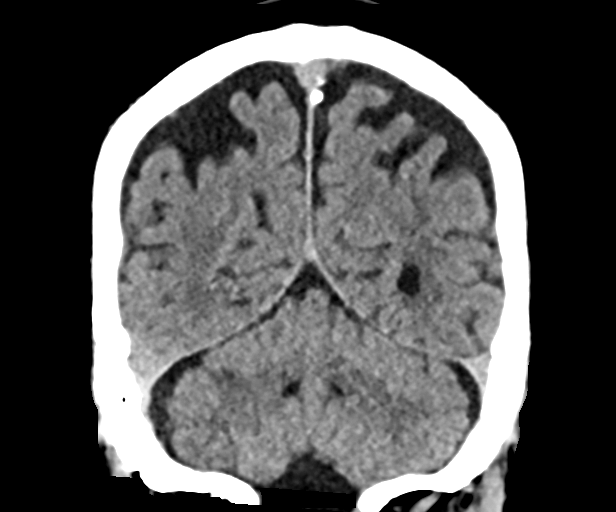
[im 33/73  brain]
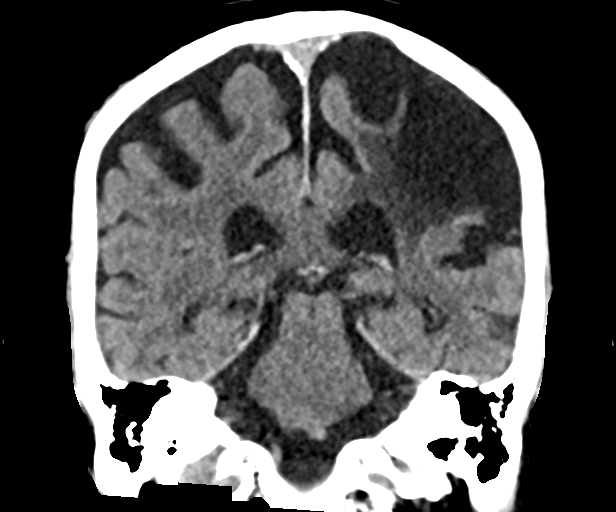
[im 41/73  brain]
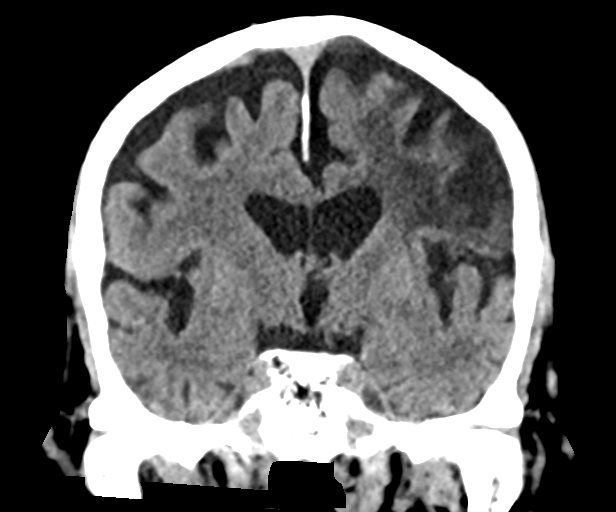

[Series 6: sag soft · sagittal · 0.29mm/px · 3 of 60 slices shown]
[im 20/60  brain]
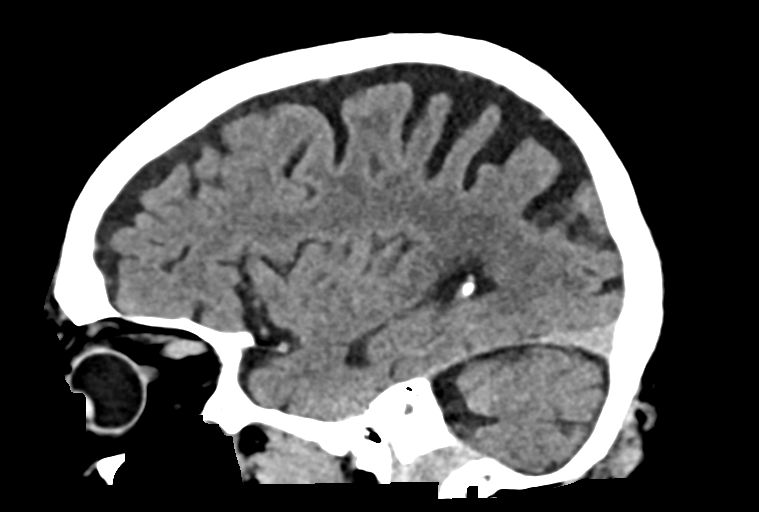
[im 30/60  brain]
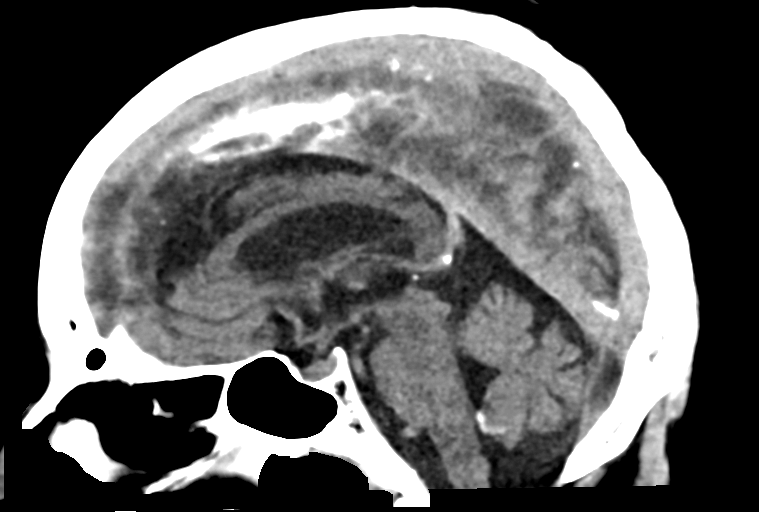
[im 40/60  brain]
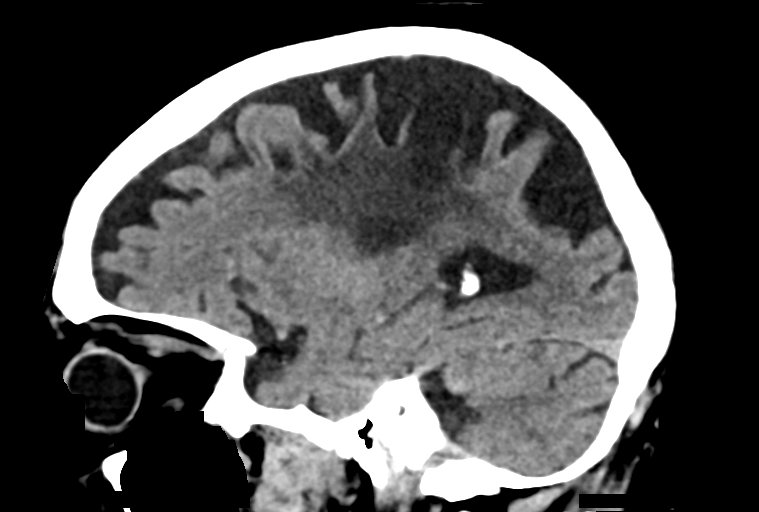

[15 of 47 positions shown; findings below may reference images not displayed]

FINDINGS: Brain: Small bilateral cerebellar infarcts not described on a [K3]
brain MRI but likely chronic given discrete appearance. Large remote
left MCA territory infarct. Generalized brain atrophy. No acute
hemorrhage or hydrocephalus

Vascular: No hyperdense vessel or unexpected calcification.

Skull: Normal. Negative for fracture or focal lesion.

Sinuses/Orbits: No acute finding.

Other: These results were communicated to Dr. TESZT at [DATE]
TESZT [DATE]by text page via the [employer] messaging system.

ASPECTS ([place] Stroke Program Early CT Score)

Not scored without localizing symptoms
IMPRESSION: 1. Large remote left MCA branch infarct.
2. Small bilateral cerebellar infarcts not described on a [K3]
report, but favored chronic.

## 2020-05-30 IMAGING — MR MR HEAD W/O CM
6 of 10 series · 32 of 48 positions shown · non-contrast
Comparison: Head CT from earlier today. Intracranial MRA report
[DATE]

CLINICAL DATA: Stroke follow-up. Difficulty getting up this
morning.



[Series 2: DWI · axial · 3.0mm · 0.94mm/px · z∈[-56,+100]mm · 9 of 106 slices shown (1 of 3)]
[im 1/106]
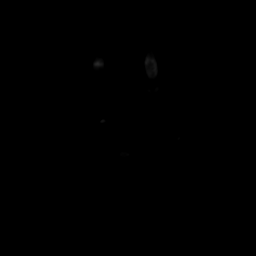
[im 14/106]
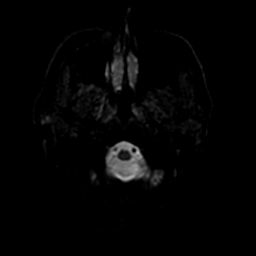
[im 27/106]
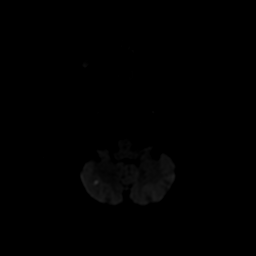
[im 40/106]
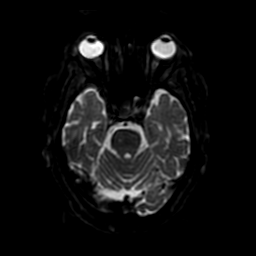
[im 53/106]
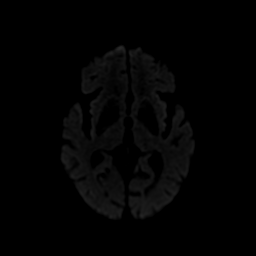
[im 66/106]
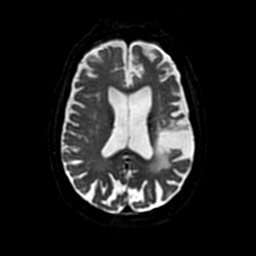
[im 79/106]
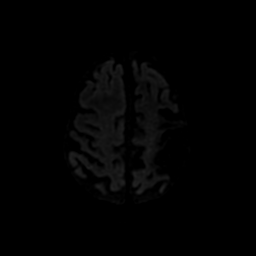
[im 92/106]
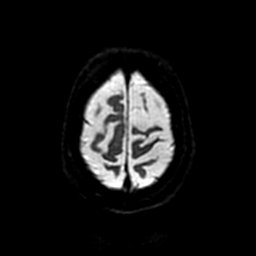
[im 106/106]
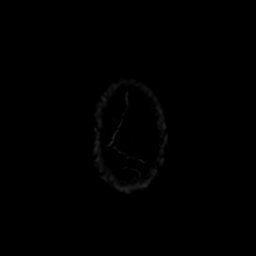

[Series 4: DWI · coronal · 4.0mm · 0.94mm/px · 7 of 74 slices shown (2 of 3)]
[im 1/74]
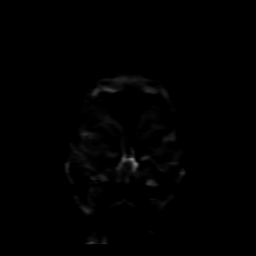
[im 13/74]
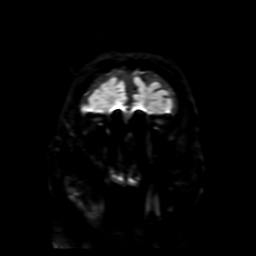
[im 25/74]
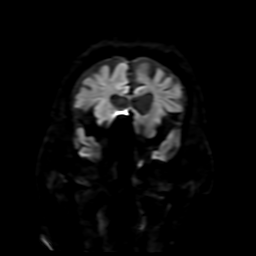
[im 37/74]
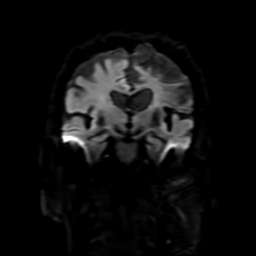
[im 49/74]
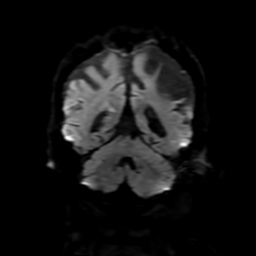
[im 61/74]
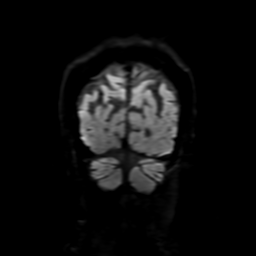
[im 74/74]
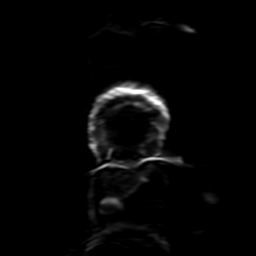

[Series 5: FLAIR · sagittal · 5.0mm · 0.23mm/px · 2 of 24 slices shown (1 of 2)]
[im 1/24]
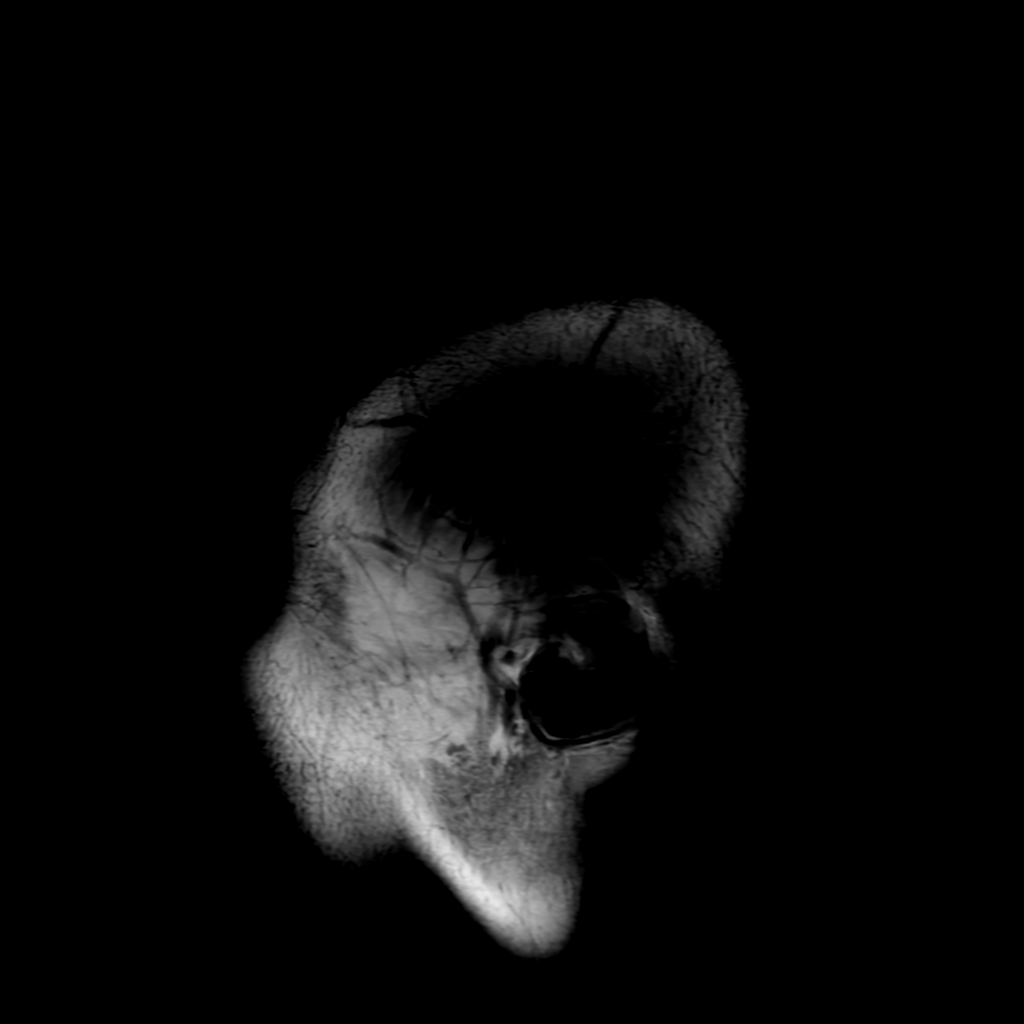
[im 24/24]
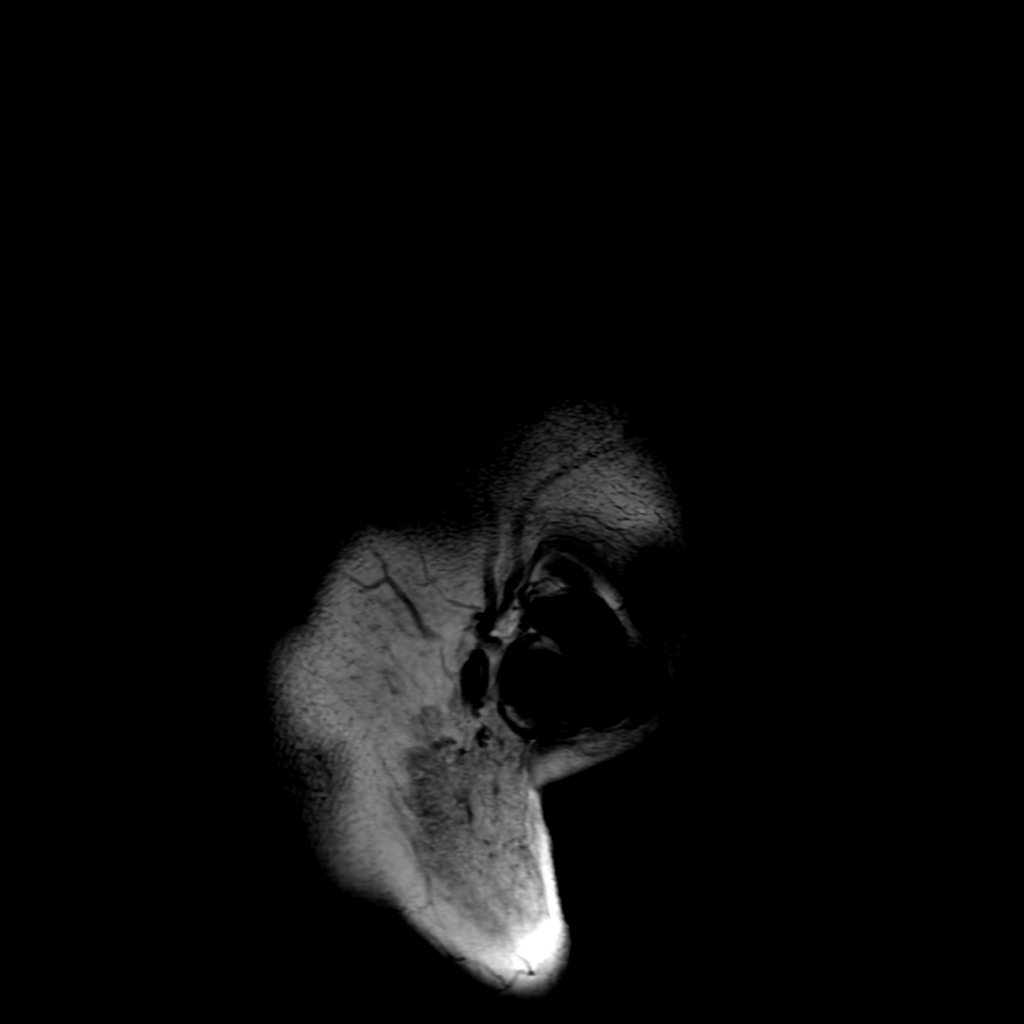

[Series 7: FLAIR · axial · 3.0mm · 0.45mm/px · z∈[-56,+99]mm · 2 of 27 slices shown (2 of 2)]
[im 1/27]
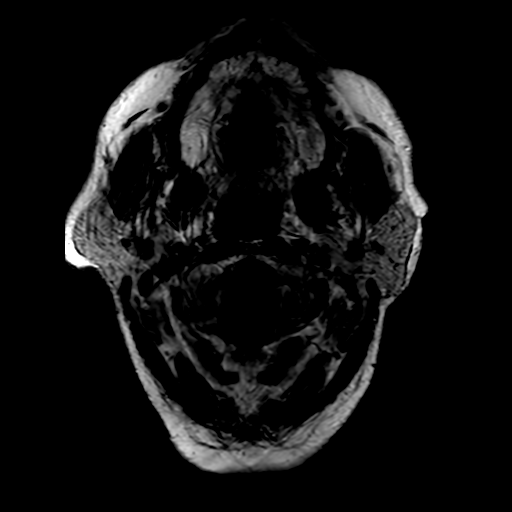
[im 27/27]
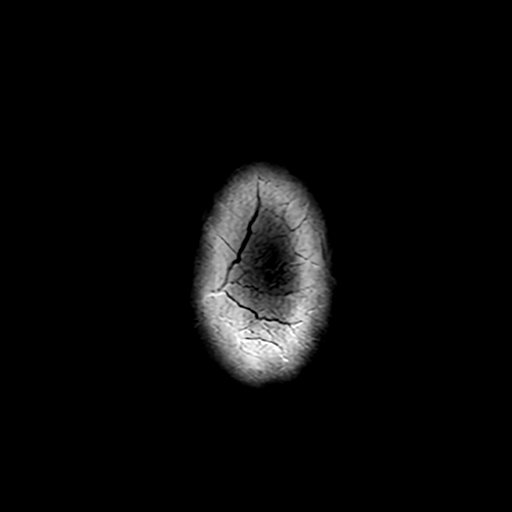

[Series 210: DWI · axial · 3.0mm · 0.94mm/px · z∈[-56,+100]mm · 8 of 106 slices shown (3 of 3)]
[im 1/106]
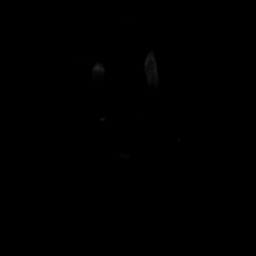
[im 16/106]
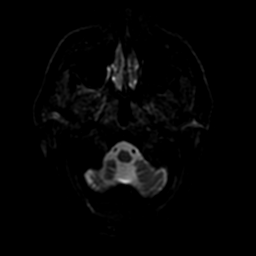
[im 31/106]
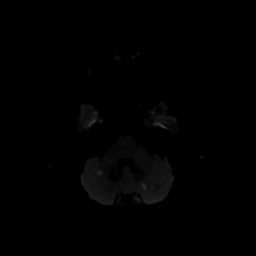
[im 46/106]
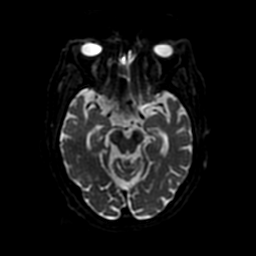
[im 61/106]
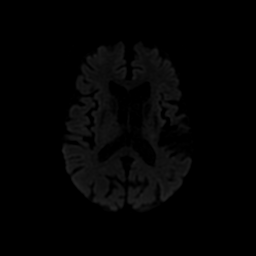
[im 76/106]
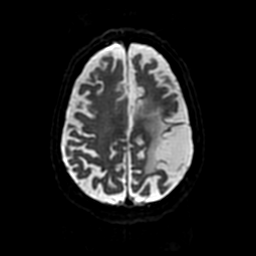
[im 91/106]
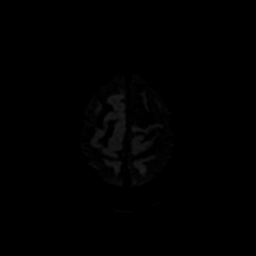
[im 106/106]
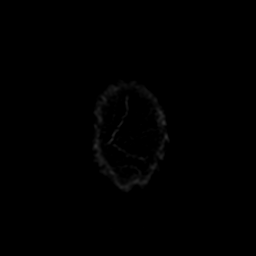

[Series 250: ADC · axial · 3.0mm · 0.94mm/px · z∈[-56,+100]mm · 4 of 53 slices shown]
[im 1/53]
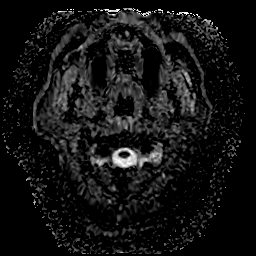
[im 18/53]
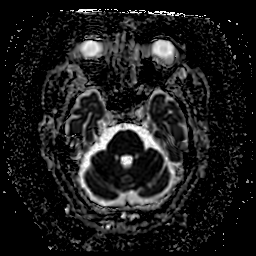
[im 35/53]
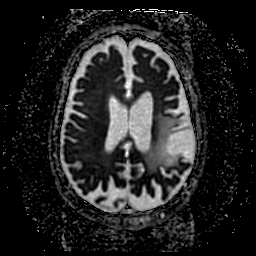
[im 53/53]
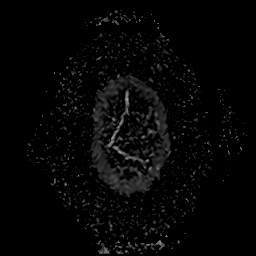

[32 of 48 positions shown; findings below may reference images not displayed]

FINDINGS: MRI HEAD FINDINGS

Brain: Small acute infarcts in the bilateral cerebellum. Equivocal
for tiny lacunar infarct in the right pons. There is background
chronic small vessel infarcts bilaterally. Small acute infarct in
the right occipital cortex.

Large remote left MCA branch infarct in the superior division. No
acute hemorrhage, hydrocephalus, or masslike finding. Generalized
atrophy. No hydrocephalus or collection.

Vascular: The right vertebral artery and also is smaller than less
hypointense than the left.

Skull and upper cervical spine: Negative

Sinuses/Orbits: Negative

MRA HEAD FINDINGS

No flow in the proximal right V4 segment with distal reconstitution
that is presumably retrograde, with faint filling of the right PICA.
Patent left PICA. Bilateral superior cerebellar arteries are patent.
There is a fetal type left PCA. Diffusely patent carotid arteries
where visualized. Hypoplastic right A1 segment. No branch occlusion,
flow limiting stenosis, or aneurysm in the anterior circulation.

MRA NECK FINDINGS

Unremarkable arch where covered. There is 3 vessel branching motion
artifact diffusely and especially affecting the proximal common
carotid arteries. No flow limiting stenosis or ulceration is seen in
either carotid circulation. No proximal subclavian again stenosis
affected noted by motion artifact, but. No flow is seen throughout
the right vertebral artery. The left vertebral artery is smoothly
contoured and diffusely patent
IMPRESSION: 1. Small acute infarcts in the bilateral cerebellum, right occipital
cortex, and possibly right pons.
2. No flow throughout most of the right vertebral artery with distal
V4 segment reconstitution and faintly flowing right PICA. The right
vertebral artery was also diseased on a [0F] intracranial MRA
report.
3. Small remote bilateral cerebellar infarcts suggesting this is a
recurrent process.
4. Large remote left MCA branch infarct.
5. Motion degraded neck MRA.

## 2020-05-30 IMAGING — MR MR MRA NECK W/O CM
2 of 3 series · 14 of 48 positions shown · IV contrast (Yes GAD)
Comparison: Head CT from earlier today. Intracranial MRA report
[DATE]

CLINICAL DATA: Stroke follow-up. Difficulty getting up this
morning.



[Series 12: sag inhance (id) · sagittal · 1.2mm · 0.47mm/px · 13 of 360 slices shown]
[im 1/360]
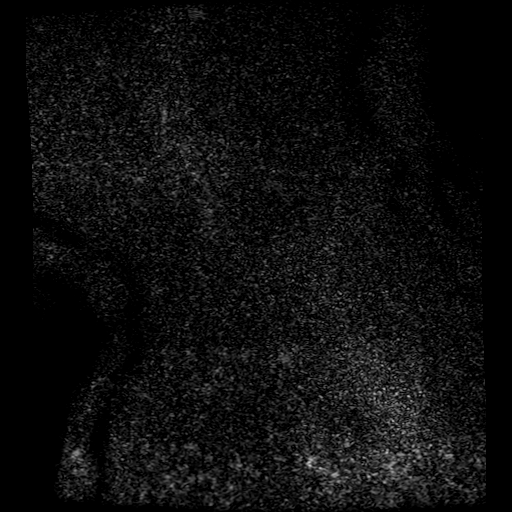
[im 12/360]
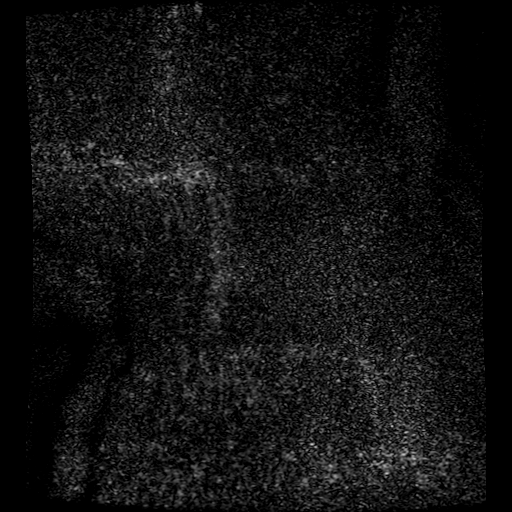
[im 23/360]
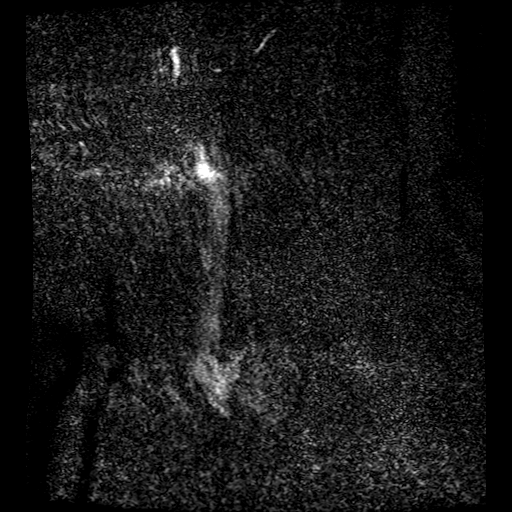
[im 57/360]
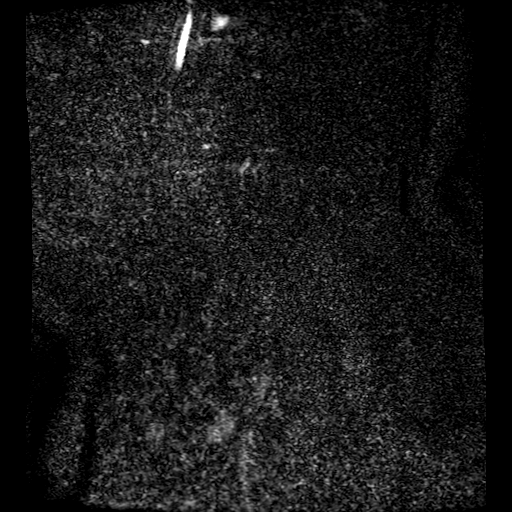
[im 68/360]
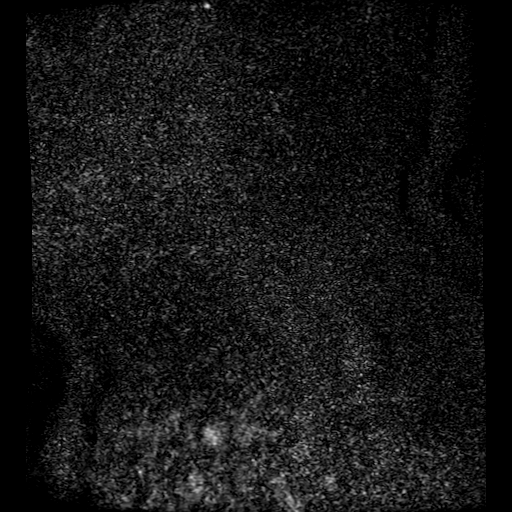
[im 113/360]
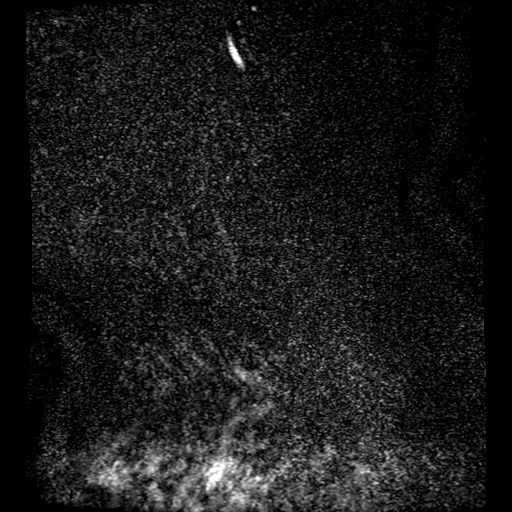
[im 158/360]
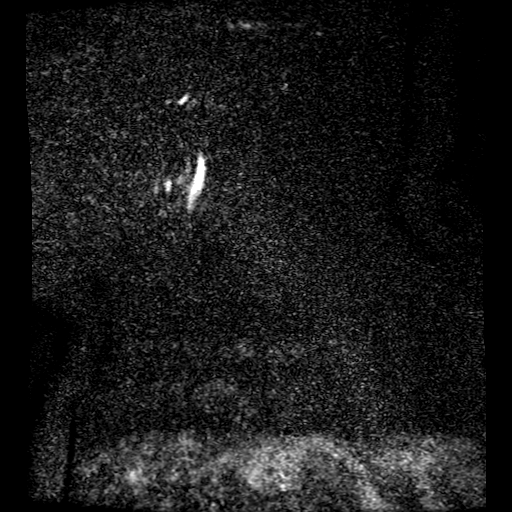
[im 180/360]
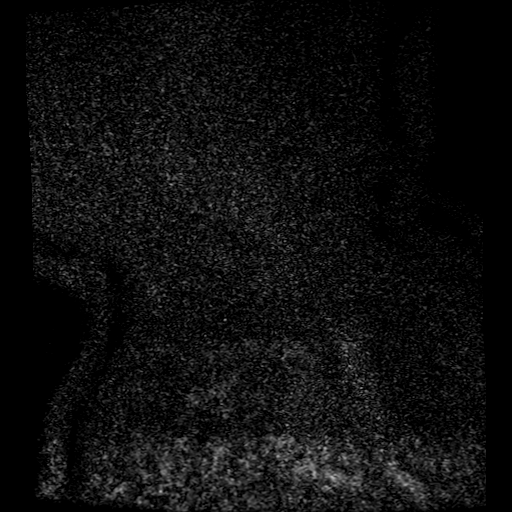
[im 202/360]
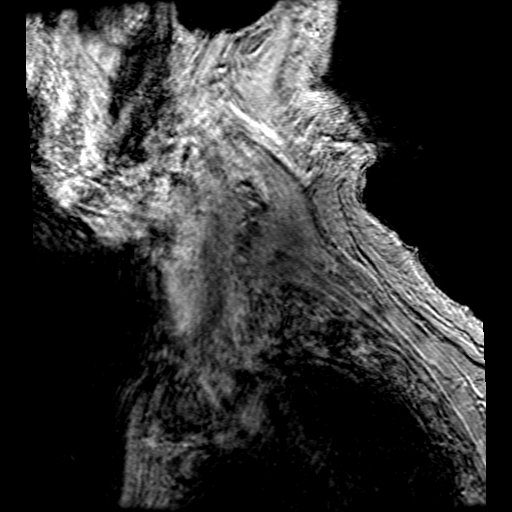
[im 247/360]
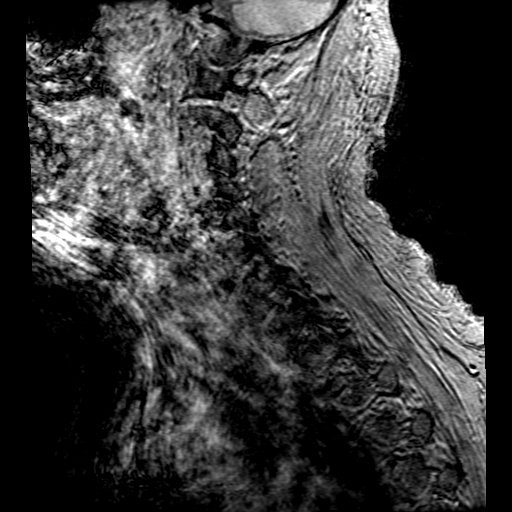
[im 292/360]
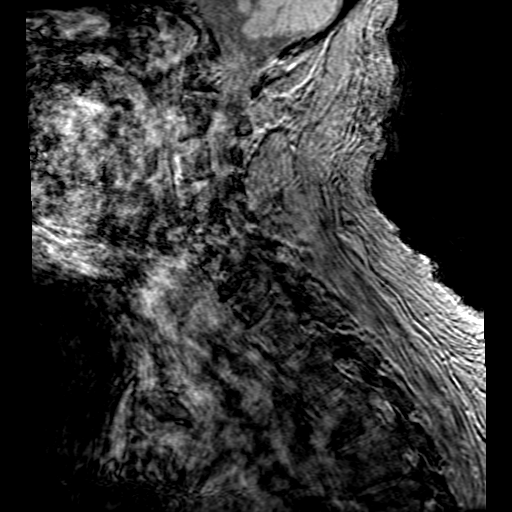
[im 303/360]
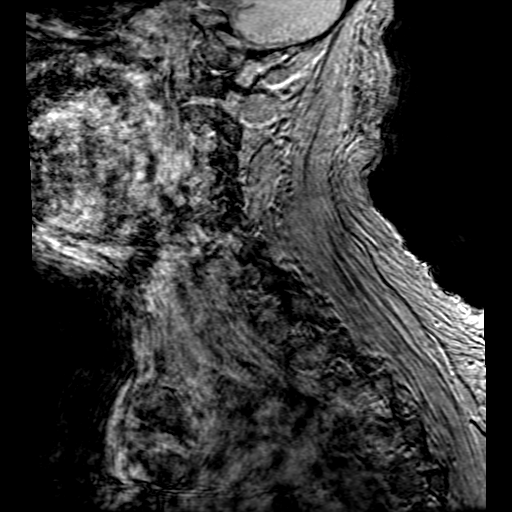
[im 337/360]
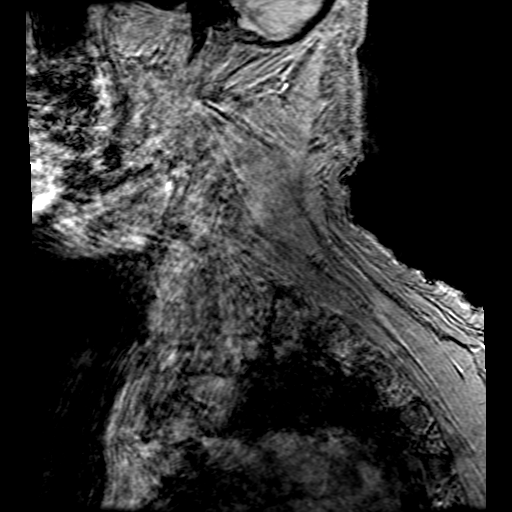

[Series 1651: projection images · coronal · 2.4mm · 0.21mm/px · 1 of 11 slices shown]
[im 1/11]
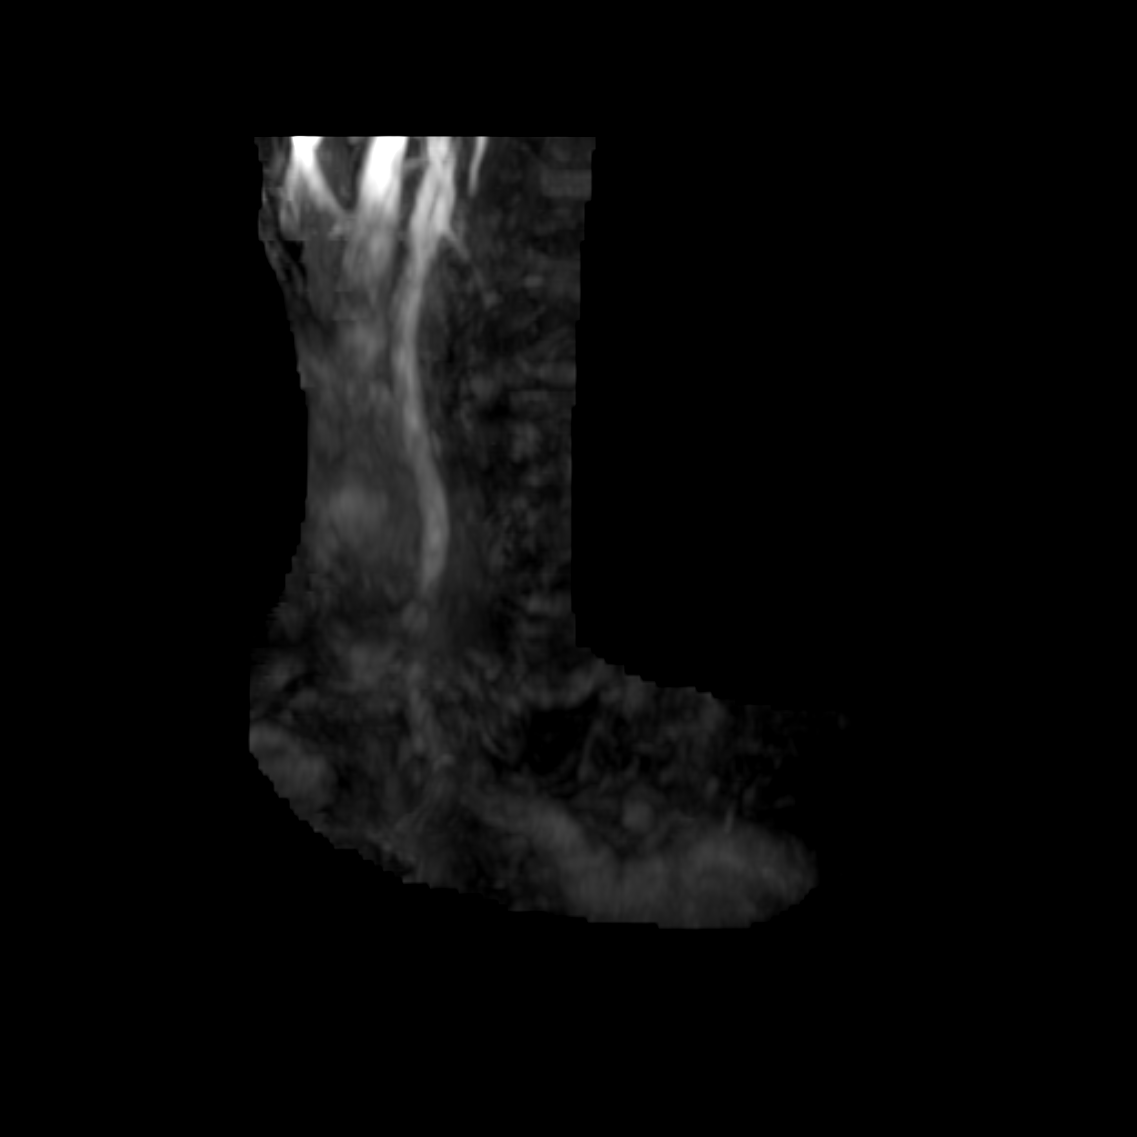

[14 of 48 positions shown; findings below may reference images not displayed]

FINDINGS: MRI HEAD FINDINGS

Brain: Small acute infarcts in the bilateral cerebellum. Equivocal
for tiny lacunar infarct in the right pons. There is background
chronic small vessel infarcts bilaterally. Small acute infarct in
the right occipital cortex.

Large remote left MCA branch infarct in the superior division. No
acute hemorrhage, hydrocephalus, or masslike finding. Generalized
atrophy. No hydrocephalus or collection.

Vascular: The right vertebral artery and also is smaller than less
hypointense than the left.

Skull and upper cervical spine: Negative

Sinuses/Orbits: Negative

MRA HEAD FINDINGS

No flow in the proximal right V4 segment with distal reconstitution
that is presumably retrograde, with faint filling of the right PICA.
Patent left PICA. Bilateral superior cerebellar arteries are patent.
There is a fetal type left PCA. Diffusely patent carotid arteries
where visualized. Hypoplastic right A1 segment. No branch occlusion,
flow limiting stenosis, or aneurysm in the anterior circulation.

MRA NECK FINDINGS

Unremarkable arch where covered. There is 3 vessel branching motion
artifact diffusely and especially affecting the proximal common
carotid arteries. No flow limiting stenosis or ulceration is seen in
either carotid circulation. No proximal subclavian again stenosis
affected noted by motion artifact, but. No flow is seen throughout
the right vertebral artery. The left vertebral artery is smoothly
contoured and diffusely patent
IMPRESSION: 1. Small acute infarcts in the bilateral cerebellum, right occipital
cortex, and possibly right pons.
2. No flow throughout most of the right vertebral artery with distal
V4 segment reconstitution and faintly flowing right PICA. The right
vertebral artery was also diseased on a [0F] intracranial MRA
report.
3. Small remote bilateral cerebellar infarcts suggesting this is a
recurrent process.
4. Large remote left MCA branch infarct.
5. Motion degraded neck MRA.

## 2020-05-30 MED ORDER — ACETAMINOPHEN 160 MG/5ML PO SOLN: 650.0000 mg | ORAL | Status: DC | PRN

## 2020-05-30 MED ORDER — RIVAROXABAN 20 MG PO TABS
20.0000 mg | ORAL_TABLET | Freq: Every day | ORAL | Status: DC
  Filled 2020-05-30: qty 1

## 2020-05-30 MED ORDER — STROKE: EARLY STAGES OF RECOVERY BOOK: Freq: Once | Status: AC

## 2020-05-30 MED ORDER — ACETAMINOPHEN 650 MG RE SUPP: 650.0000 mg | RECTAL | Status: DC | PRN

## 2020-05-30 MED ORDER — ACETAMINOPHEN 325 MG PO TABS: 650.0000 mg | ORAL_TABLET | ORAL | Status: DC | PRN

## 2020-05-30 MED ORDER — SODIUM CHLORIDE 0.9% FLUSH: 3.0000 mL | Freq: Once | INTRAVENOUS | Status: DC

## 2020-05-30 MED ORDER — ASPIRIN EC 81 MG PO TBEC
81.0000 mg | DELAYED_RELEASE_TABLET | Freq: Every day | ORAL | Status: DC
  Administered 2020-05-31 – 2020-06-03 (×4): 81 mg via ORAL
  Filled 2020-05-30 (×4): qty 1

## 2020-05-30 MED ORDER — SENNOSIDES-DOCUSATE SODIUM 8.6-50 MG PO TABS: 1.0000 | ORAL_TABLET | Freq: Every evening | ORAL | Status: DC | PRN

## 2020-05-30 NOTE — ED Notes (Signed)
Pt went to MRI.

## 2020-05-30 NOTE — ED Triage Notes (Signed)
Pt BIB EMS from home due to stroke. Pt called for CODE STROKE. Pt wife states LKN 2030. Pt presents with new slurred . Pt wife states h/o stroke with right sided deficits.

## 2020-05-30 NOTE — Progress Notes (Signed)
  Echocardiogram 2D Echocardiogram has been performed.  Matilde Bash 05/30/2020, 3:09 PM

## 2020-05-30 NOTE — ED Notes (Addendum)
Pt automatically fails swallow eval due to speech and confusion. MD notified. MD advised to wait for SLP

## 2020-05-30 NOTE — ED Notes (Signed)
Pt arrived back from MRI.

## 2020-05-30 NOTE — Consult Note (Signed)
Neurology Consultation Reason for Consult: Increasing speech difficulty Referring Physician: Horton, K  CC: Difficulty standing  History is obtained from: Patient, wife  HPI: Jeffery Roach is a 87 y.o. male with a history of previous stroke as well as atrial fibrillation on anticoagulation with Xarelto who presents with difficulty getting up this morning.  He fell asleep in a chair watching TV, and when his wife got up to tell him to get up he had difficulty standing up from the chair.  She felt like his speech was more slurred than typical and therefore called 911 to assist her.  Because of worsening speech dysfunction code stroke was activated and he was brought in for evaluation.    LKW: 5/28 tpa given?: no, anticoagulation Premorbid modified rankin scale: Three    ROS: Unable to obtain due to altered mental status.   Past Medical History:  Diagnosis Date  . Acute on chronic systolic (congestive) heart failure ([place])   . Anemia due to stage 3 chronic kidney disease ([place])   . Arteriosclerotic coronary artery disease   . Atrial fibrillation ([place])   . Benign hypertension with chronic kidney disease, stage III ([place])   . Cerebral artery occlusion with cerebral infarction ([place])   . Cortical age-related cataract of both eyes   . Dermatochalasis of both upper eyelids   . Diabetes mellitus with stage 3 chronic kidney disease ([place])   . Gastroesophageal reflux disease without esophagitis   . History of heart attack   . Hypercalcemia   . Hyperlipidemia   . Hyperopia with astigmatism and presbyopia, bilateral   . Hypertension with goal to be determined   . Hyperthyroidism   . Hypokalemia   . LV dysfunction   . Malignant neoplasm of prostate ([place])   . Metabolic bone disease   . Myocardial infarction ([place])   . Nuclear sclerotic cataract of both eyes   . Personal history of rectal cancer   . Toxic multinodular goiter   . Toxic multinodular goiter   . Transient ischemic attack    . Type 2 diabetes mellitus with hyperglycemia ([place])   . Vitamin D deficiency   . Vitreous floaters of both eyes      Family History  Problem Relation Age of Onset  . Diabetes Sister   . Stroke Sister   . Hypertension Sister      Social History:  reports that he has quit smoking. He has never used smokeless tobacco. No history on file for alcohol use and drug use.   Exam: Current vital signs: BP (!) 167/100 (BP Location: Right Arm)   Pulse 89   Temp 98 F (36.7 C) (Oral)   Resp 16   SpO2 98%  Vital signs in last 24 hours: Temp:  [98 F (36.7 C)] 98 F (36.7 C) (05/29 0737) Pulse Rate:  [89-93] 89 (05/29 0800) Resp:  [16-21] 16 (05/29 0800) BP: (147-167)/(100-103) 167/100 (05/29 0800) SpO2:  [98 %-100 %] 98 % (05/29 0800)   Physical Exam  Constitutional: Appears well-developed and well-nourished.  Psych: Affect appropriate to situation Eyes: No scleral injection HENT: No OP obstruction MSK: no joint deformities.  Cardiovascular: Normal rate and regular rhythm.  Respiratory: Effort normal, non-labored breathing GI: Soft.  No distension. There is no tenderness.  Skin: WDI  Neuro: Mental Status: Patient is awake, alert, he has a significant aphasia, but is able to tell me that the month is April (it is May) and his age.  He is very dysarthric and difficult to  understand. Cranial Nerves: II: He endorses seeing fingers wiggle in both hemifield's pupils are equal, round, and reactive to light.   III,IV, VI: EOMI without ptosis or diploplia.  V: Facial sensation is diminished throughout the right side VII: Facial movement with significant right facial droop VIII: hearing is intact to voice X: Uvula elevates symmetrically XI: Shoulder shrug is symmetric. XII: tongue is midline without atrophy or fasciculations.  Motor: Tone is normal. Bulk is normal. 5/5 strength was present on the left side, on the right he has spastic hemiparesis, with drift in the right upper  extremity but none in the right lower Sensory: Sensation is diminished throughout the right side Cerebellar: Does not perform    I have reviewed labs in epic and the results pertinent to this consultation are: Creatinine 1.77 Sodium 137 Mild leukocytosis at 11.3  I have reviewed the images obtained: CT head- stable old MCA infarct  Impression: 87 year old male with a history of previous left MCA infarct who presents with mild worsening of his previous symptoms in the setting of a leukocytosis.  My suspicion is that this likely represents recrudescence of his previous stroke symptoms, but he does need an MRI to further characterize his current deficits.  Recommendations: 1) MRI brain, MRA head and neck 2) infectious work-up per ED 3) further neurological work-up only if MRI is positive.   Roland Rack, MD [facility] [phone]  If 7pm- 7am, please page neurology on call as listed in [place].

## 2020-05-30 NOTE — ED Notes (Signed)
Meal tray orderd

## 2020-05-30 NOTE — ED Provider Notes (Addendum)
[facility] EMERGENCY DEPARTMENT Provider Note   CSN: [id number] Arrival date & time: 05/30/20  9528  An emergency department physician performed an initial assessment on this suspected stroke patient at [phone].  History Chief Complaint  Patient presents with  . Code Stroke    Jeffery Roach is a 87 y.o. male.  HPI   87 year old male with past medical history of CKD, chronic anemia, atrial fibrillation anticoagulated, previous CVA with right-sided deficits, HTN, HLD presents to the emergency department by EMS as a code stroke.  Patient evaluated initially and EMS bridge and CT scanner with neurology team at bedside.  Report is for worsening slurred speech and aphasia.  No family is at bedside.  Patient is a poor historian.  Airway is intact.  Past Medical History:  Diagnosis Date  . Acute on chronic systolic (congestive) heart failure ([place])   . Anemia due to stage 3 chronic kidney disease ([place])   . Arteriosclerotic coronary artery disease   . Atrial fibrillation ([place])   . Benign hypertension with chronic kidney disease, stage III ([place])   . Cerebral artery occlusion with cerebral infarction ([place])   . Cortical age-related cataract of both eyes   . Dermatochalasis of both upper eyelids   . Diabetes mellitus with stage 3 chronic kidney disease ([place])   . Gastroesophageal reflux disease without esophagitis   . History of heart attack   . Hypercalcemia   . Hyperlipidemia   . Hyperopia with astigmatism and presbyopia, bilateral   . Hypertension with goal to be determined   . Hyperthyroidism   . Hypokalemia   . LV dysfunction   . Malignant neoplasm of prostate ([place])   . Metabolic bone disease   . Myocardial infarction ([place])   . Nuclear sclerotic cataract of both eyes   . Personal history of rectal cancer   . Toxic multinodular goiter   . Toxic multinodular goiter   . Transient ischemic attack   . Type 2 diabetes mellitus with hyperglycemia ([place])   . Vitamin D  deficiency   . Vitreous floaters of both eyes     Patient Active Problem List   Diagnosis Date Noted  . Right sided weakness 03/17/2018  . History of CVA with residual deficit 03/17/2018  . Leukocytosis 03/17/2018  . Chronic atrial fibrillation ([name]) 03/17/2018  . Hypertension associated with diabetes ([place]) 03/17/2018  . Diabetes mellitus type II, controlled ([place]) 03/17/2018  . Hyperlipidemia associated with type 2 diabetes mellitus ([employer]) 03/17/2018    Past Surgical History:  Procedure Laterality Date  . CARDIAC SURGERY    . COLOPROCTECTOMY    . CORONARY ARTERY BYPASS GRAFT    . GASTROSTOMY TUBE PLACEMENT    . INGUINAL HERNIA REPAIR    . TONSILLECTOMY         Family History  Problem Relation Age of Onset  . Diabetes Sister   . Stroke Sister   . Hypertension Sister     Social History   Tobacco Use  . Smoking status: Former Research scientist (life sciences)  . Smokeless tobacco: Never Used    Home Medications Prior to Admission medications   Medication Sig Start Date End Date Taking? Authorizing Provider  aspirin EC 81 MG tablet Take 81 mg by mouth daily.    [provider]  atorvastatin (LIPITOR) 10 MG tablet Take 1 tablet (10 mg total) by mouth daily. 04/04/18   Gerlene Fee, NP  cholecalciferol (VITAMIN D) 25 MCG (1000 UT) tablet Take 1,000 Units by mouth daily.  [provider]  diltiazem (CARDIZEM) 120 MG tablet Take 1 tablet (120 mg total) by mouth daily. 04/04/18   Gerlene Fee, NP  ferrous sulfate 325 (65 FE) MG tablet Take 1 tablet (325 mg total) by mouth daily with breakfast. 04/04/18   Gerlene Fee, NP  glipiZIDE (GLUCOTROL XL) 10 MG 24 hr tablet Take 1 tablet (10 mg total) by mouth daily with breakfast. 04/04/18   Gerlene Fee, NP  insulin lispro (HUMALOG) 100 UNIT/ML injection Inject 0.03-0.15 mLs (3-15 Units total) into the skin 3 (three) times daily with meals. SSI: 151-200: 3 units; 201-250: 5 units; 251-300: 8 units; 301-350: 11 units; 351-400: 15  units 04/04/18   Nyoka Cowden, Phylis Bougie, NP  metoprolol succinate (TOPROL-XL) 25 MG 24 hr tablet Take 1 tablet (25 mg total) by mouth daily. 04/04/18   Gerlene Fee, NP  pantoprazole (PROTONIX) 40 MG tablet Take 40 mg by mouth daily.    [provider]  rivaroxaban (XARELTO) 20 MG TABS tablet Take 1 tablet (20 mg total) by mouth daily with supper. 04/04/18   Gerlene Fee, NP    Allergies    Patient has no known allergies.  Review of Systems   Review of Systems  Unable to perform ROS: Acuity of condition    Physical Exam Updated Vital Signs BP (!) 167/100   Pulse (!) 35   Temp 98 F (36.7 C) (Oral)   Resp 16   SpO2 98%   Physical Exam Vitals and nursing note reviewed.  Constitutional:      Appearance: Normal appearance.  HENT:     Head: Normocephalic.     Mouth/Throat:     Mouth: Mucous membranes are moist.  Cardiovascular:     Rate and Rhythm: Normal rate.  Pulmonary:     Effort: Pulmonary effort is normal. No respiratory distress.  Abdominal:     Palpations: Abdomen is soft.     Tenderness: There is no abdominal tenderness.  Skin:    General: Skin is warm.  Neurological:     Mental Status: He is alert and oriented to person, place, and time. Mental status is at baseline.     Comments: Patient does have noted slurred speech on exam with aphasia, no acute facial droop, has a difficult time with cooperation fully following neuro command, slight weakness on the right side which appears to be deficit from previous stroke.  Psychiatric:        Mood and Affect: Mood normal.     ED Results / Procedures / Treatments   Labs (all labs ordered are listed, but only abnormal results are displayed) Labs Reviewed  PROTIME-INR - Abnormal; Notable for the following components:      Result Value   Prothrombin Time 19.0 (*)    INR 1.6 (*)    All other components within normal limits  APTT - Abnormal; Notable for the following components:   aPTT 38 (*)    All other  components within normal limits  CBC - Abnormal; Notable for the following components:   WBC 11.3 (*)    All other components within normal limits  DIFFERENTIAL - Abnormal; Notable for the following components:   Lymphs Abs 4.1 (*)    All other components within normal limits  COMPREHENSIVE METABOLIC PANEL - Abnormal; Notable for the following components:   Glucose, Bld 133 (*)    BUN 24 (*)    Creatinine, Ser 1.77 (*)    Calcium 8.8 (*)  Albumin 3.3 (*)    GFR, Estimated 36 (*)    All other components within normal limits  I-STAT CHEM 8, ED - Abnormal; Notable for the following components:   BUN 31 (*)    Creatinine, Ser 1.80 (*)    Glucose, Bld 130 (*)    Calcium, Ion 1.09 (*)    All other components within normal limits  CBG MONITORING, ED - Abnormal; Notable for the following components:   Glucose-Capillary 117 (*)    All other components within normal limits  URINALYSIS, ROUTINE W REFLEX MICROSCOPIC    EKG EKG Interpretation  Date/Time:  Sunday May 30 2020 07:39:00 EDT Ventricular Rate:  92 PR Interval:    QRS Duration: 108 QT Interval:  405 QTC Calculation: 456 R Axis:   -81 Text Interpretation: Atrial fibrillation Paired ventricular premature complexes LVH with secondary repolarization abnormality Inferior infarct, old Anterior infarct, old Afib, PVC Confirmed by Keslie Gritz ([id number]) on 05/30/2020 8:08:41 AM   Radiology No results found.  Procedures Procedures   Medications Ordered in ED Medications  sodium chloride flush (NS) 0.9 % injection 3 mL (3 mLs Intravenous Not Given 05/30/20 0741)    ED Course  I have reviewed the triage vital signs and the nursing notes.  Pertinent labs & imaging results that were available during my care of the patient were reviewed by me and considered in my medical decision making (see chart for details).    MDM Rules/Calculators/A&P                          87  year old male evaluated as a stroke page for change in  speech.  Neurology team at bedside.  Head CT showed no acute bleed.  Patient is out of the window for acute therapy and is also on anticoagulation.  Blood work is baseline for the patient.  MRI shows new acute strokes, neurology is aware and recommends medical admission.  Patient's wife has been notified.  Patients evaluation and results requires admission for further treatment and care. Patient agrees with admission plan, offers no new complaints and is stable/unchanged at time of admit.  Final Clinical Impression(s) / ED Diagnoses Final diagnoses:  None    Rx / DC Orders ED Discharge Orders    None       Lorelle Gibbs, DO 05/30/20 [place], Alvin Critchley, DO 05/31/20 1617

## 2020-05-30 NOTE — H&P (Signed)
Date: 05/30/2020               Patient Name:  Jeffery Roach MRN: [id number]  DOB: [birth year] Age / Sex: 87 y.o., male   PCP: Jonathon Resides, MD         Medical Service: Internal Medicine Teaching Service         Attending Physician: Dr. Velna Ochs, MD    First Contact: Dr. Sanjuan Dame Pager: [phone]  Second Contact: Dr. Harvie Heck Pager: [phone]       After Hours (After 5p/  First Contact Pager: [phone]  weekends / holidays): Second Contact Pager: [phone]   Chief Complaint: slurred speech  History of Present Illness:  Jeffery Roach is 87yo cismale with prior CVA with residual right-sided deficits, chronic atrial fibrillation on Xarelto, rectal cancer, prostate cancer, CKDIIIb, CAD s/p CABG 2952, chronic diastolic heart failure (EF 60-65% in 03/2018), hypertension, type 2 diabetes mellitus presenting to [facility] from home via EMS with worsening slurred speech. Patient reports he is not sure why is here in the hospital. He does mention chronic expressive aphasia, which has been occurring for the last 5 years. Also states that he has right-sided weakness during this time. Jeffery Roach believes he is taking his medications, but this is unclear. He denies any fevers, chills, sick contacts, chest pain, dyspnea, abdominal pain, nausea, or vomiting.  Per chart review, patient had difficulty getting up this morning and further had difficulty getting up from a chair later in the morning. Patient's wife though his speech was worse than normal and EMS was called.  Meds:  Amlodipine 5mg  QD Aspirin 81mg  QD Atorvastatin 10mg  QD Cholecalciferol 1000U QD Dapagliflozin 10mg  QD Ferrous sulfate 325mg  QD Furosemide 40mg  QD Glipizide 10mg  QD Pantoprazole 40mg  QD Rivaroxaban 20mg  QD  Allergies: Allergies as of 05/30/2020  . (No Known Allergies)   Past Medical History:  Diagnosis Date  . Acute on chronic systolic (congestive) heart failure ([place])   . Anemia due to stage 3  chronic kidney disease ([place])   . Arteriosclerotic coronary artery disease   . Atrial fibrillation ([place])   . Benign hypertension with chronic kidney disease, stage III ([place])   . Cerebral artery occlusion with cerebral infarction ([place])   . Cortical age-related cataract of both eyes   . Dermatochalasis of both upper eyelids   . Diabetes mellitus with stage 3 chronic kidney disease ([place])   . Gastroesophageal reflux disease without esophagitis   . History of heart attack   . Hypercalcemia   . Hyperlipidemia   . Hyperopia with astigmatism and presbyopia, bilateral   . Hypertension with goal to be determined   . Hyperthyroidism   . Hypokalemia   . LV dysfunction   . Malignant neoplasm of prostate ([place])   . Metabolic bone disease   . Myocardial infarction ([place])   . Nuclear sclerotic cataract of both eyes   . Personal history of rectal cancer   . Toxic multinodular goiter   . Toxic multinodular goiter   . Transient ischemic attack   . Type 2 diabetes mellitus with hyperglycemia ([place])   . Vitamin D deficiency   . Vitreous floaters of both eyes    Family History:  Family History  Problem Relation Age of Onset  . Diabetes Sister   . Stroke Sister   . Hypertension Sister    Social History:  Lives in [place] with his wife, Barbaraann Share, for "plenty of years". No current tobacco use but reports smoking for  26 years previously. Denies alcohol use or illicit drug use  Review of Systems: A complete ROS was negative except as per HPI.   Physical Exam: Blood pressure (!) 157/70, pulse 68, temperature 98 F (36.7 C), temperature source Oral, resp. rate 20, SpO2 96 %. General: Elderly, laying in bed in no acute distress HENT: Normocephalic, atraumatic. Moist mucous membranes. CV: Irregular rate, rhythm. No murmurs, rubs, gallops appreciated. Distal pulses 2+ bilaterally.  Pulm: Normal work of breathing. Clear to auscultation from anterior fields. GI: Abdomen soft, non-tender, non-distended.  Normoactive bowel sounds. MSK: Normal bulk, tone. No pitting edema bilaterally. Skin: Warm, dry. No rashes or lesions appreciated. Neuro: Awake, alert, answering questions appropriately. Decreased sensation on R side of face. Otherwise CN in tact. Sensation decreased on RUE, RLE. Motor 4/5 on RUE & RLE, 5/5 LUE & LLE. Expressive aphasia present. Psych: Normal mood, speech, affect.   CBC Latest Ref Rng & Units 05/30/2020 05/30/2020  WBC 4.0 - 10.5 K/uL 11.3(H) -  Hemoglobin 13.0 - 17.0 g/dL 14.3 15.0  Hematocrit 39.0 - 52.0 % 46.0 44.0  Platelets 150 - 400 K/uL 190 -   BMP Latest Ref Rng & Units 05/30/2020 05/30/2020  Glucose 70 - 99 mg/dL 133(H) 130(H)  BUN 8 - 23 mg/dL 24(H) 31(H)  Creatinine 0.61 - 1.24 mg/dL 1.77(H) 1.80(H)  Sodium 135 - 145 mmol/L 137 140  Potassium 3.5 - 5.1 mmol/L 4.5 4.4  Chloride 98 - 111 mmol/L 105 107  CO2 22 - 32 mmol/L 25 -  Calcium 8.9 - 10.3 mg/dL 8.8(L) -   EKG: personally reviewed my interpretation is atrial fibrillation, PVC's, possible conduction delay; HR 92, QTc 456  CT HEAD WO CONTRAST:  Large remote left MCA branch infarct. Small bilateral cerebellar infarcts not described on a 2020 report, but favored chronic.  MRI BRAIN & MRA HEAD/NECK WO CONTRAST: Small acute infarcts in the bilateral cerebellum, right occipital cortex, and possibly right pons. No flow throughout most of the right vertebral artery with distal V4 segment reconstitution and faintly flowing right PICA. The right vertebral artery was also diseased on a 2014 intracranial MRA report. Small remote bilateral cerebellar infarcts suggesting this is a recurrent process. Large remote left MCA branch infarct. Motion degraded neck MRA.  Assessment & Plan by Problem: Jeffery Roach is 87yo cismale with prior CVA with residual right-sided deficits, chronic atrial fibrillation on Xarelto, rectal cancer, prostate cancer  CAD s/p CABG 2005, CKDIIIb, chronic diastolic heart failure (EF 60-65% in  03/2018), hypertension, type 2 diabetes mellitus admitted 5/29 for acute CVA in cerebellum and right occipital cortex.  Active Problems:   Acute CVA (cerebrovascular accident) ([place])  #Acute CVA of cerebellum, right occipital cortex Since arrival patient has been afebrile and hemodynamically stable. In ED, code stroke was placed and neurology was consulted. CT head negative for bleeding. Appreciate neurology's assistance. Patient did have previous CVA in 2015 of left MCA, leaving him with multiple right-sided deficits as well as expressive aphasia. On exam today, appears to have very similar deficits. Further imaging with MRI showed small acute infarcts in bilateral cerebellum and right occipital cortex, MRA revealed no flow through right vertebral artery. Patient does have history of atrial fibrillation, which is most likely the cause of this new stroke. Obtaining TTE for further evaluation. Patient recently had lipid panel and A1c completed at his primary care office four days ago. Holding anti-hypertensive in setting of permissive hypertension. - Appreciate neurology recommendations - Follow-up TTE - ASA 81mg , will discuss with  neurology - Permissive hypertension 48hr from LKN - PT/OT/SLP - Tele  #Chronic atrial fibrillation on Xarelto Patient has been rate-controlled since arrival. Unclear if patient was taking all of his medications at home, will discuss with family. For further stroke work-up will obtain TTE to further evaluate for thrombus. Holding off home diltiazem and metoprolol to allow for permissive hypertension. - Follow-up TSH - Hold home antihypertensive - Xarelto 20mg  daily  #Chronic kidney disease stage IIIb Initial lab work revealed sCr 1.77 w/ GFR 36. Per Care Everywhere, this appears to be patient's baseline. Will continue to monitor. Patient's nephrologist is Dr. Olivia Mackie at [facility] - Daily BMP - I/O - Avoid nephrotoxins  #Chronic diastolic heart failure Last Echo  revealed EF 60-65% in March 2020. Patient's cardiologist is Dr. Quillian Quince with [facility]. Obtaining another TTE here while completing stroke work-up. On exam, patient appears euvolemic without any signs of heart failure. Holding blood pressure medications in order to allow permissive hypertension. - Hold home antihypertensive - Follow-up TTE - Daily weights - Tele  #Type 2 diabetes mellitus Last A1c 8.0% four days ago. Glucose mildly elevated at 130 on arrival. Patient's endocrinologist is Dr. Posey Pronto with [facility]. At home patient takes glipizide and sliding scale with meals. Will continue with sliding scale and monitoring while inpatient. - CBG monitoring - SSI  #Dyslipidemia Patient had lipid panel completed four days ago with primary care. This revealed TChol 99, LDL 47, HDL 37. Appears to be well-controlled, will hold off further lab work.   #Hypertension Holding antihypertensives in setting of permissive hypertension.  #Rectal cancer #Prostate cancer Patient was previously diagnosed with rectal cancer in 2017. It was decided that no further aggressive surveillance in the absence of symptoms would be pursued. Currently patient is asymptomatic. In addition, he was diagnosed with prostate cancer in 2003. He continues to take Lupron and denosumab with Dr. Amalia Hailey, his urologist.   DIET: NPO pending SLP eval IVF: n/a DVT PPX: Xarelto BOWEL: Senokot-S QHS CODE: FULL FAM COM: n/a  Dispo: Admit patient to Observation with expected length of stay less than 2 midnights.  Signed: Sanjuan Dame, MD 05/30/2020, 1:02 PM  Pager: [phone] After 5pm on weekdays and 1pm on weekends: On Call pager: [phone]

## 2020-05-31 ENCOUNTER — Inpatient Hospital Stay (HOSPITAL_COMMUNITY): Payer: Medicare Other

## 2020-05-31 DIAGNOSIS — I69390 Apraxia following cerebral infarction: Secondary | ICD-10-CM | POA: Diagnosis not present

## 2020-05-31 DIAGNOSIS — I5042 Chronic combined systolic (congestive) and diastolic (congestive) heart failure: Secondary | ICD-10-CM | POA: Diagnosis not present

## 2020-05-31 DIAGNOSIS — Z8546 Personal history of malignant neoplasm of prostate: Secondary | ICD-10-CM | POA: Diagnosis not present

## 2020-05-31 DIAGNOSIS — I272 Pulmonary hypertension, unspecified: Secondary | ICD-10-CM | POA: Diagnosis not present

## 2020-05-31 DIAGNOSIS — I69398 Other sequelae of cerebral infarction: Secondary | ICD-10-CM | POA: Diagnosis not present

## 2020-05-31 DIAGNOSIS — I5032 Chronic diastolic (congestive) heart failure: Secondary | ICD-10-CM | POA: Diagnosis present

## 2020-05-31 DIAGNOSIS — Z6831 Body mass index (BMI) 31.0-31.9, adult: Secondary | ICD-10-CM | POA: Diagnosis not present

## 2020-05-31 DIAGNOSIS — N1832 Chronic kidney disease, stage 3b: Secondary | ICD-10-CM | POA: Diagnosis present

## 2020-05-31 DIAGNOSIS — D631 Anemia in chronic kidney disease: Secondary | ICD-10-CM | POA: Diagnosis present

## 2020-05-31 DIAGNOSIS — E1165 Type 2 diabetes mellitus with hyperglycemia: Secondary | ICD-10-CM | POA: Diagnosis not present

## 2020-05-31 DIAGNOSIS — G8191 Hemiplegia, unspecified affecting right dominant side: Secondary | ICD-10-CM | POA: Diagnosis present

## 2020-05-31 DIAGNOSIS — E8889 Other specified metabolic disorders: Secondary | ICD-10-CM | POA: Diagnosis present

## 2020-05-31 DIAGNOSIS — Z8673 Personal history of transient ischemic attack (TIA), and cerebral infarction without residual deficits: Secondary | ICD-10-CM

## 2020-05-31 DIAGNOSIS — I69351 Hemiplegia and hemiparesis following cerebral infarction affecting right dominant side: Secondary | ICD-10-CM | POA: Diagnosis present

## 2020-05-31 DIAGNOSIS — I130 Hypertensive heart and chronic kidney disease with heart failure and stage 1 through stage 4 chronic kidney disease, or unspecified chronic kidney disease: Secondary | ICD-10-CM | POA: Diagnosis present

## 2020-05-31 DIAGNOSIS — I2510 Atherosclerotic heart disease of native coronary artery without angina pectoris: Secondary | ICD-10-CM | POA: Diagnosis present

## 2020-05-31 DIAGNOSIS — I63412 Cerebral infarction due to embolism of left middle cerebral artery: Secondary | ICD-10-CM | POA: Diagnosis present

## 2020-05-31 DIAGNOSIS — I6329 Cerebral infarction due to unspecified occlusion or stenosis of other precerebral arteries: Secondary | ICD-10-CM | POA: Diagnosis present

## 2020-05-31 DIAGNOSIS — E1122 Type 2 diabetes mellitus with diabetic chronic kidney disease: Secondary | ICD-10-CM | POA: Diagnosis present

## 2020-05-31 DIAGNOSIS — I2721 Secondary pulmonary arterial hypertension: Secondary | ICD-10-CM

## 2020-05-31 DIAGNOSIS — Z933 Colostomy status: Secondary | ICD-10-CM | POA: Diagnosis not present

## 2020-05-31 DIAGNOSIS — Z20822 Contact with and (suspected) exposure to covid-19: Secondary | ICD-10-CM | POA: Diagnosis present

## 2020-05-31 DIAGNOSIS — Z85048 Personal history of other malignant neoplasm of rectum, rectosigmoid junction, and anus: Secondary | ICD-10-CM | POA: Diagnosis not present

## 2020-05-31 DIAGNOSIS — I69322 Dysarthria following cerebral infarction: Secondary | ICD-10-CM | POA: Diagnosis not present

## 2020-05-31 DIAGNOSIS — R531 Weakness: Secondary | ICD-10-CM | POA: Diagnosis not present

## 2020-05-31 DIAGNOSIS — R1312 Dysphagia, oropharyngeal phase: Secondary | ICD-10-CM | POA: Diagnosis present

## 2020-05-31 DIAGNOSIS — E0590 Thyrotoxicosis, unspecified without thyrotoxic crisis or storm: Secondary | ICD-10-CM | POA: Diagnosis present

## 2020-05-31 DIAGNOSIS — R2689 Other abnormalities of gait and mobility: Secondary | ICD-10-CM | POA: Diagnosis present

## 2020-05-31 DIAGNOSIS — I5022 Chronic systolic (congestive) heart failure: Secondary | ICD-10-CM | POA: Diagnosis present

## 2020-05-31 DIAGNOSIS — Z7901 Long term (current) use of anticoagulants: Secondary | ICD-10-CM | POA: Diagnosis not present

## 2020-05-31 DIAGNOSIS — I495 Sick sinus syndrome: Secondary | ICD-10-CM | POA: Diagnosis present

## 2020-05-31 DIAGNOSIS — I639 Cerebral infarction, unspecified: Secondary | ICD-10-CM | POA: Diagnosis present

## 2020-05-31 DIAGNOSIS — E1169 Type 2 diabetes mellitus with other specified complication: Secondary | ICD-10-CM | POA: Diagnosis not present

## 2020-05-31 DIAGNOSIS — E669 Obesity, unspecified: Secondary | ICD-10-CM | POA: Diagnosis present

## 2020-05-31 DIAGNOSIS — I4891 Unspecified atrial fibrillation: Secondary | ICD-10-CM | POA: Diagnosis not present

## 2020-05-31 DIAGNOSIS — I69391 Dysphagia following cerebral infarction: Secondary | ICD-10-CM | POA: Diagnosis not present

## 2020-05-31 DIAGNOSIS — L89152 Pressure ulcer of sacral region, stage 2: Secondary | ICD-10-CM | POA: Diagnosis present

## 2020-05-31 DIAGNOSIS — I4819 Other persistent atrial fibrillation: Secondary | ICD-10-CM | POA: Diagnosis present

## 2020-05-31 DIAGNOSIS — I472 Ventricular tachycardia: Secondary | ICD-10-CM | POA: Diagnosis not present

## 2020-05-31 DIAGNOSIS — I69320 Aphasia following cerebral infarction: Secondary | ICD-10-CM | POA: Diagnosis not present

## 2020-05-31 DIAGNOSIS — Z951 Presence of aortocoronary bypass graft: Secondary | ICD-10-CM | POA: Diagnosis not present

## 2020-05-31 DIAGNOSIS — I482 Chronic atrial fibrillation, unspecified: Secondary | ICD-10-CM | POA: Diagnosis present

## 2020-05-31 DIAGNOSIS — I69319 Unspecified symptoms and signs involving cognitive functions following cerebral infarction: Secondary | ICD-10-CM | POA: Diagnosis not present

## 2020-05-31 DIAGNOSIS — R471 Dysarthria and anarthria: Secondary | ICD-10-CM | POA: Diagnosis not present

## 2020-05-31 DIAGNOSIS — H532 Diplopia: Secondary | ICD-10-CM | POA: Diagnosis present

## 2020-05-31 DIAGNOSIS — I63449 Cerebral infarction due to embolism of unspecified cerebellar artery: Secondary | ICD-10-CM | POA: Diagnosis present

## 2020-05-31 DIAGNOSIS — E039 Hypothyroidism, unspecified: Secondary | ICD-10-CM | POA: Diagnosis present

## 2020-05-31 DIAGNOSIS — L8990 Pressure ulcer of unspecified site, unspecified stage: Secondary | ICD-10-CM | POA: Insufficient documentation

## 2020-05-31 LAB — CBC WITH DIFFERENTIAL/PLATELET
Abs Immature Granulocytes: 0.03 10*3/uL (ref 0.00–0.07)
Basophils Absolute: 0.1 10*3/uL (ref 0.0–0.1)
Basophils Relative: 0 %
Eosinophils Absolute: 0.3 10*3/uL (ref 0.0–0.5)
Eosinophils Relative: 3 %
HCT: 49.9 % (ref 39.0–52.0)
Hemoglobin: 15.7 g/dL (ref 13.0–17.0)
Immature Granulocytes: 0 %
Lymphocytes Relative: 23 %
Lymphs Abs: 2.8 10*3/uL (ref 0.7–4.0)
MCH: 28 pg (ref 26.0–34.0)
MCHC: 31.5 g/dL (ref 30.0–36.0)
MCV: 89.1 fL (ref 80.0–100.0)
Monocytes Absolute: 0.7 10*3/uL (ref 0.1–1.0)
Monocytes Relative: 6 %
Neutro Abs: 8.1 10*3/uL — ABNORMAL HIGH (ref 1.7–7.7)
Neutrophils Relative %: 68 %
Platelets: 235 10*3/uL (ref 150–400)
RBC: 5.6 MIL/uL (ref 4.22–5.81)
RDW: 14.6 % (ref 11.5–15.5)
WBC: 11.9 10*3/uL — ABNORMAL HIGH (ref 4.0–10.5)
nRBC: 0 % (ref 0.0–0.2)

## 2020-05-31 LAB — COMPREHENSIVE METABOLIC PANEL
ALT: 23 U/L (ref 0–44)
AST: 38 U/L (ref 15–41)
Albumin: 3.4 g/dL — ABNORMAL LOW (ref 3.5–5.0)
Alkaline Phosphatase: 77 U/L (ref 38–126)
Anion gap: 8 (ref 5–15)
BUN: 24 mg/dL — ABNORMAL HIGH (ref 8–23)
CO2: 23 mmol/L (ref 22–32)
Calcium: 9.1 mg/dL (ref 8.9–10.3)
Chloride: 105 mmol/L (ref 98–111)
Creatinine, Ser: 1.6 mg/dL — ABNORMAL HIGH (ref 0.61–1.24)
GFR, Estimated: 41 mL/min — ABNORMAL LOW (ref >60)
Glucose, Bld: 125 mg/dL — ABNORMAL HIGH (ref 70–99)
Potassium: 5 mmol/L (ref 3.5–5.1)
Sodium: 136 mmol/L (ref 135–145)
Total Bilirubin: 1.5 mg/dL — ABNORMAL HIGH (ref 0.3–1.2)
Total Protein: 8.4 g/dL — ABNORMAL HIGH (ref 6.5–8.1)

## 2020-05-31 LAB — SARS CORONAVIRUS 2 (TAT 6-24 HRS): SARS Coronavirus 2: NEGATIVE

## 2020-05-31 LAB — TROPONIN I (HIGH SENSITIVITY): Troponin I (High Sensitivity): 58 ng/L — ABNORMAL HIGH (ref <18)

## 2020-05-31 LAB — TSH: TSH: 4.152 u[IU]/mL (ref 0.350–4.500)

## 2020-05-31 LAB — GLUCOSE, CAPILLARY: Glucose-Capillary: 125 mg/dL — ABNORMAL HIGH (ref 70–99)

## 2020-05-31 IMAGING — NM NM PULMONARY PERF PARTICULATE
7 series · 7 of 7 positions shown · non-contrast
Comparison: Chest x-ray from earlier in the same day.

CLINICAL DATA: Difficulty breathing

EXAM:
NUCLEAR MEDICINE PERFUSION LUNG SCAN
TECHNIQUE: Perfusion images were obtained in multiple projections after
intravenous injection of radiopharmaceutical.
Ventilation scans intentionally deferred if perfusion scan and chest
x-ray adequate for interpretation during COVID 19 epidemic.
RADIOPHARMACEUTICALS:  4.4 mCi [XF] MAA IV

[Series 1: ant/post perf · 4.14mm/px · 1 of 1 slices shown (1 of 2)]
[im 1/1]
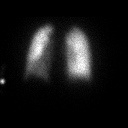

[Series 1: ant/post perf · 4.14mm/px · 1 of 1 slices shown (2 of 2)]
[im 1/1]
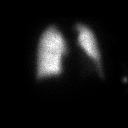

[Series 2: lao/rpo perf · 4.14mm/px · 1 of 1 slices shown]
[im 1/1]
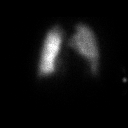

[Series 3: lpo/rao perf · 4.14mm/px · 1 of 1 slices shown (1 of 2)]
[im 1/1]
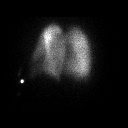

[Series 3: lpo/rao perf · 4.14mm/px · 1 of 1 slices shown (2 of 2)]
[im 1/1]
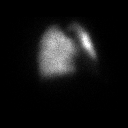

[Series 4: lt lat/rt lat perf · 4.14mm/px · 1 of 1 slices shown (1 of 2)]
[im 1/1]
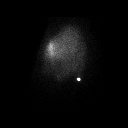

[Series 4: lt lat/rt lat perf · 4.14mm/px · 1 of 1 slices shown (2 of 2)]
[im 1/1]
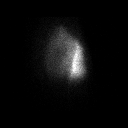

[7 of 7 positions shown; findings below may reference images not displayed]

FINDINGS: Adequate uptake is noted throughout both lungs. No wedge-shaped
defects are identified to suggest pulmonary embolism.
IMPRESSION: No evidence of pulmonary embolism.

## 2020-05-31 IMAGING — DX DG CHEST 1V
1 series · 1 of 1 positions shown · non-contrast
Comparison: [DATE]

CLINICAL DATA: Heart failure

EXAM:
CHEST  1 VIEW

[chest]
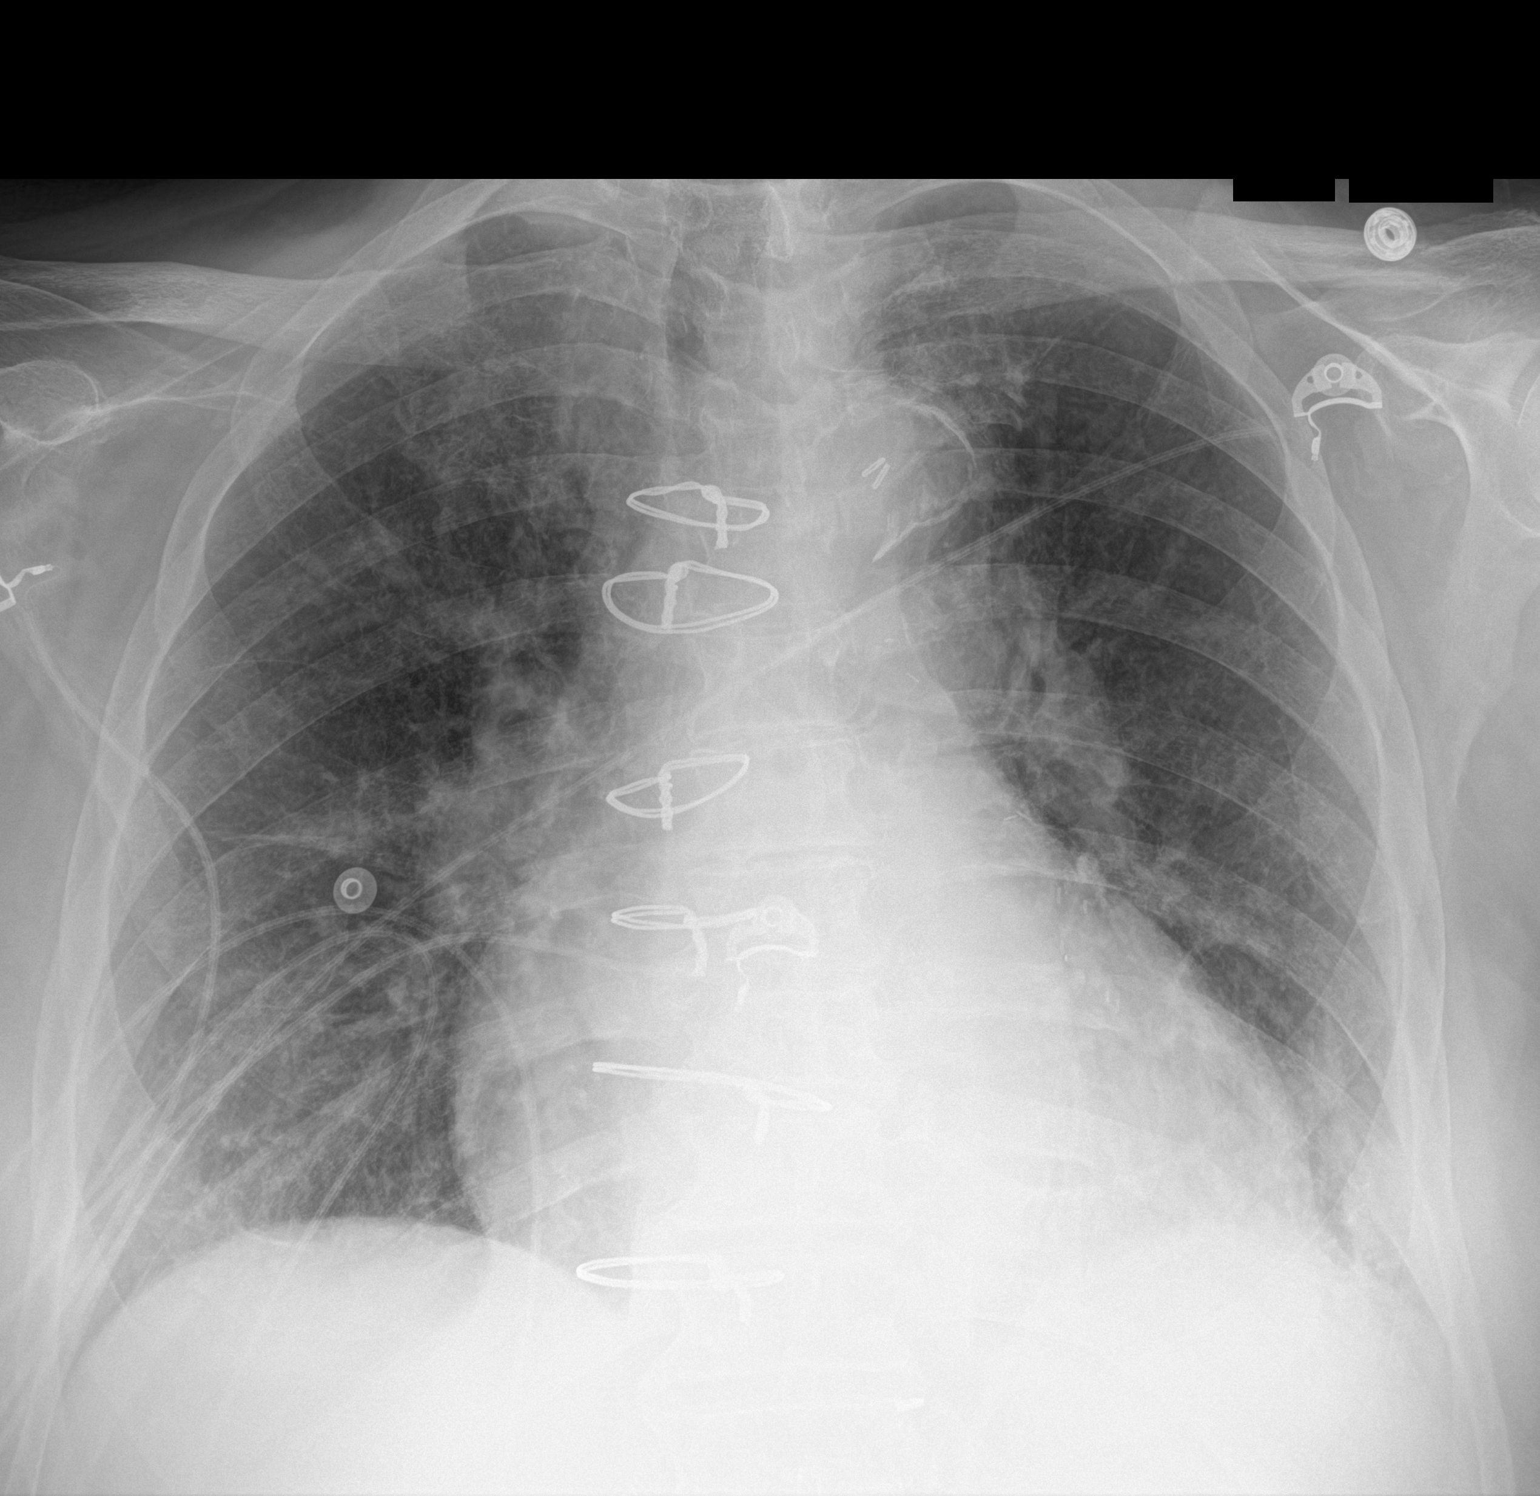

[1 of 1 positions shown; findings below may reference images not displayed]

FINDINGS: Post sternotomy changes. Cardiomegaly with vascular congestion and
mild diffuse interstitial opacity, likely edema. Patchy atelectasis
left base. No large effusion. Aortic atherosclerosis.
IMPRESSION: 1. Cardiomegaly with vascular congestion and probable mild
interstitial edema.
2. Streaky atelectasis at the left lung base.

## 2020-05-31 MED ORDER — RIVAROXABAN 15 MG PO TABS
15.0000 mg | ORAL_TABLET | Freq: Every day | ORAL | Status: DC
  Filled 2020-05-31: qty 1

## 2020-05-31 MED ORDER — TECHNETIUM TO 99M ALBUMIN AGGREGATED
4.4000 | Freq: Once | INTRAVENOUS | Status: AC | PRN
  Administered 2020-05-31: 4.4 via INTRAVENOUS

## 2020-05-31 MED ORDER — ATORVASTATIN CALCIUM 10 MG PO TABS
10.0000 mg | ORAL_TABLET | Freq: Every day | ORAL | Status: DC
  Administered 2020-05-31 – 2020-06-03 (×4): 10 mg via ORAL
  Filled 2020-05-31 (×4): qty 1

## 2020-05-31 MED ORDER — ORAL CARE MOUTH RINSE
15.0000 mL | Freq: Two times a day (BID) | OROMUCOSAL | Status: DC
  Administered 2020-05-31 – 2020-06-03 (×7): 15 mL via OROMUCOSAL

## 2020-05-31 MED ORDER — METOPROLOL TARTRATE 12.5 MG HALF TABLET
12.5000 mg | ORAL_TABLET | Freq: Two times a day (BID) | ORAL | Status: DC
  Administered 2020-05-31 – 2020-06-01 (×2): 12.5 mg via ORAL
  Filled 2020-05-31 (×2): qty 1

## 2020-05-31 MED ORDER — CHLORHEXIDINE GLUCONATE 0.12 % MT SOLN
15.0000 mL | Freq: Two times a day (BID) | OROMUCOSAL | Status: DC
  Administered 2020-05-31 – 2020-06-03 (×6): 15 mL via OROMUCOSAL
  Filled 2020-05-31 (×7): qty 15

## 2020-05-31 MED ORDER — APIXABAN 2.5 MG PO TABS
2.5000 mg | ORAL_TABLET | Freq: Two times a day (BID) | ORAL | Status: DC
  Administered 2020-05-31 – 2020-06-03 (×6): 2.5 mg via ORAL
  Filled 2020-05-31 (×6): qty 1

## 2020-05-31 MED ORDER — METOPROLOL SUCCINATE ER 25 MG PO TB24: 25.0000 mg | ORAL_TABLET | Freq: Every day | ORAL | Status: DC

## 2020-05-31 MED ORDER — INSULIN ASPART 100 UNIT/ML IJ SOLN
0.0000 [IU] | Freq: Three times a day (TID) | INTRAMUSCULAR | Status: DC
  Administered 2020-05-31: 2 [IU] via SUBCUTANEOUS
  Administered 2020-06-01 (×2): 3 [IU] via SUBCUTANEOUS
  Administered 2020-06-01: 2 [IU] via SUBCUTANEOUS
  Administered 2020-06-02: 3 [IU] via SUBCUTANEOUS
  Administered 2020-06-02 – 2020-06-03 (×3): 2 [IU] via SUBCUTANEOUS
  Administered 2020-06-03: 5 [IU] via SUBCUTANEOUS
  Administered 2020-06-03: 3 [IU] via SUBCUTANEOUS

## 2020-05-31 MED ORDER — INSULIN ASPART 100 UNIT/ML IJ SOLN: 0.0000 [IU] | Freq: Every day | INTRAMUSCULAR | Status: DC

## 2020-05-31 NOTE — Discharge Summary (Signed)
Name: Jeffery Roach MRN: [id number] DOB: 08/01/1927 87 y.o. PCP: Jonathon Resides, MD  Date of Admission: 05/30/2020  7:19 AM Date of Discharge: 06/03/2020 Attending Physician: Aldine Contes, MD  Discharge Diagnosis: 1. 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 Chronic persistent atrial fibrillation with high PVC burden 3. Chronic heart failure w/ mildly reduced ejection fraction and severe pulmonary hypertension  4. Chronic kidney disease stage IIIb 5. Type 2 diabetes mellitus  Discharge Medications: Allergies as of 06/03/2020   No Known Allergies     Medication List    STOP taking these medications   diltiazem 120 MG tablet Commonly known as: CARDIZEM   insulin lispro 100 UNIT/ML injection Commonly known as: HUMALOG   metoprolol succinate 25 MG 24 hr tablet Commonly known as: TOPROL-XL   rivaroxaban 20 MG Tabs tablet Commonly known as: Xarelto     TAKE these medications   amiodarone 100 MG tablet Commonly known as: PACERONE Take 1 tablet (100 mg total) by mouth daily. Start taking on: June 04, 2020   amLODipine 5 MG tablet Commonly known as: NORVASC Take 5 mg by mouth daily after breakfast.   apixaban 2.5 MG Tabs tablet Commonly known as: ELIQUIS Take 1 tablet (2.5 mg total) by mouth 2 (two) times daily.   aspirin EC 81 MG tablet Take 81 mg by mouth daily after breakfast.   atorvastatin 10 MG tablet Commonly known as: LIPITOR Take 1 tablet (10 mg total) by mouth daily. What changed: when to take this   cholecalciferol 25 MCG (1000 UNIT) tablet Commonly known as: VITAMIN D Take 1,000 Units by mouth daily after breakfast.   dapagliflozin propanediol 10 MG Tabs tablet Commonly known as: FARXIGA Take 10 mg by mouth daily before breakfast.   ferrous sulfate 325 (65 FE) MG tablet Take 1 tablet (325 mg total) by mouth daily with breakfast. What changed: when to take this   furosemide 40 MG tablet Commonly known as:  LASIX Take 40 mg by mouth daily after breakfast.   glipiZIDE 10 MG 24 hr tablet Commonly known as: GLUCOTROL XL Take 1 tablet (10 mg total) by mouth daily with breakfast. What changed: when to take this   pantoprazole 40 MG tablet Commonly known as: PROTONIX Take 40 mg by mouth daily after breakfast.   potassium chloride 10 MEQ tablet Commonly known as: KLOR-CON Take 10 mEq by mouth daily before supper.            Discharge Care Instructions  (From admission, onward)         Start     Ordered   06/03/20 0000  No dressing needed        06/03/20 1014          Disposition and follow-up:   Jeffery Roach was discharged from [facility] in Stable condition.  At the hospital follow up visit please address:  1. 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 Discharged to CIR for further rehabilitation. Discharged with ASA 81mg  daily, lipitor 10mg  daily, and eliquis 2.5mg  BID (renally dosed). F/u with [facility] in 4 weeks. F/u with PCP in 3 weeks.  2. Chronic persistent atrial fibrillation with high PVC burden. Discharged with eliquis 2.5mg  BID (renally dosed) and amiodarone 100mg  daily (to decrease PVC burden). F/u with Cardiology (Dr. Terrence Dupont) in 2 weeks.  3. Chronic heart failure w/ mildly reduced ejection fraction and severe pulmonary hypertension: TTE on 05/30/2020 showing LVEF 40-45% with global hypokinesis,  mild to moderate TV regurg, moderate AV regurg. Also showed new finding of severe pulmonary HTN, unclear etiology (likely WHO Type 1 vs Type 2). V/Q scan negative. F/u with cardiology (Dr. Terrence Dupont) in 2 weeks. F/u with PCP in 3 weeks, can consider sleep study to evaluate for OSA given new-onset severe PAH.  4.  Labs / imaging needed at time of follow-up: CBC, BMP  5.  Pending labs/ test needing follow-up: None  Follow-up Appointments:  Follow-up Information    [facility]. Schedule an  appointment as soon as possible for a visit in 4 week(s).   Specialty: Neurology Contact information: [address] [place] [phone]       Charolette Forward, MD. Schedule an appointment as soon as possible for a visit in 2 week(s).   Specialty: Cardiology Contact information: [place] [address] [phone]        Jonathon Resides, MD. Schedule an appointment as soon as possible for a visit in 3 week(s).   Specialty: Family Medicine Contact information: [place] [address] [phone]              Hospital Course by problem list: 1. 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 On arrival, code stroke was called and patient was taken urgently to imaging and neurology consulted. MRI brain and MRA head/neck revealed small acute infarcts in bilateral cerebellum and right occipital cortex. Given he does have a history of persistent atrial fibrillation, this is most likely an embolic source. He was continued on aspirin 81 mg daily and switched to Eliquis 2.5 mg twice daily (renally dosed) given failure of Xarelto. Physical and occupational therapy recommending CIR for further rehabilitation. Will discharge to CIR today.  Patient to follow-up with neurology in outpatient setting in 4 weeks.   2. Chronic persistent atrial fibrillation with high PVC burden: On patient's first day of hospitalization, it was noticed that patient had a high PVC burden, as much as 20/minute. At that time patient was asymptomatic without overt electrolyte abnormalities. In addition, we were notified that he was having HR in 50-60's with intermittent slow ventricular responses, possibly due to tachy-brady syndrome. At this point, cardiology was consulted for further recommendations. He  was initially started on metoprolol but transitioned to amiodarone per cardiology.  Significant improvement in PVC burden after addition of amiodarone.  Per cardiology, will continue amiodarone 100 mg daily at discharge until patient is seen by cardiology in 2 weeks as an outpatient.  We will also continue Eliquis 2.5 mg twice daily at discharge.  3. Chronic heart failure w/ mildly reduced ejection fraction and severe pulmonary hypertension: During patient's stroke work-up a TTE was ordered to evaluate for possible thrombus in setting of likely embolic CVA. This revealed EF 40-45% (previously 60-65% in 2020) and severe pulmonary hypertension. The last Echo that was available in chart review in 2018 did not reveal any pulmonary hypertension. Given unknown etiology of PAH, work-up included V/Q scan to evaluate for chronic thromboembolic pulmonary hypertension, but V/Q scan with no abnormalities. Discussed with cardiology, who recommended no further work-up of pulmonary hypertension as an inpatient. Can consider work-up for OSA with sleep study in outpatient setting.  4. Chronic kidney disease stage IIIb: Throughout patient's admission, his renal function remained at baseline (Cr 1.7-1.8). His medications were  renally dosed and remained stable. Patient to follow-up with primary care doctor and nephrologist for further evaluation.  5. Type 2 diabetes mellitus: Patient's last A1c 8.0% when checked a few days prior to admission. Throughout hospitalization, his sugars were well-controlled with sliding scale insulin. Patient to resume home medications at discharge and follow-up with primary care physician for further needs.  Discharge Exam:   BP (!) 146/73 (BP Location: Right Arm)   Pulse 81   Temp 98.3 F (36.8 C) (Oral)   Resp 16   Ht 6' (1.829 m)   Wt 104.6 kg   SpO2 97%   BMI 31.28 kg/m  Discharge exam:  General: sitting up in bed, comfortable, NAD. CV: normal rate, irregularly irregular rhythm.  Systolic murmur present. Pulm: CTABL, no adventitious sounds noted. Normal work of breathing in on room air. Abdomen: soft, nontender, nondistended, normoactive bowel sounds. Neuro: Awake and alert, answering questions appropriately but with expressive dysarthria, chronic. Skin: warm and dry. Psych: calm and cooperative, normal mood.  Pertinent Labs, Studies, and Procedures:  CBC Latest Ref Rng & Units 06/03/2020 06/02/2020 06/01/2020  WBC 4.0 - 10.5 K/uL 9.9 9.2 9.8  Hemoglobin 13.0 - 17.0 g/dL 13.8 14.5 14.3  Hematocrit 39.0 - 52.0 % 43.2 44.8 44.6  Platelets 150 - 400 K/uL 197 196 202   BMP Latest Ref Rng & Units 06/03/2020 06/02/2020 06/01/2020  Glucose 70 - 99 mg/dL 146(H) 142(H) 117(H)  BUN 8 - 23 mg/dL 32(H) 30(H) 27(H)  Creatinine 0.61 - 1.24 mg/dL 1.97(H) 2.12(H) 1.72(H)  Sodium 135 - 145 mmol/L 136 136 137  Potassium 3.5 - 5.1 mmol/L 4.3 3.8 4.3  Chloride 98 - 111 mmol/L 107 104 105  CO2 22 - 32 mmol/L 24 22 22   Calcium 8.9 - 10.3 mg/dL 9.4 9.4 9.0   Lipid Panel     Component Value Date/Time   CHOL 102 06/01/2020 0300   TRIG 49 06/01/2020 0300   HDL 38 (L) 06/01/2020 0300   CHOLHDL 2.7 06/01/2020 0300   VLDL 10 06/01/2020 0300   LDLCALC 54 06/01/2020 0300   TSH  -  4.152  Urinalysis    Component Value Date/Time   COLORURINE YELLOW 05/30/2020 0843   APPEARANCEUR CLEAR 05/30/2020 0843   LABSPEC 1.012 05/30/2020 0843   PHURINE 8.0 05/30/2020 0843   GLUCOSEU >=500 (A) 05/30/2020 0843   HGBUR SMALL (A) 05/30/2020 0843   BILIRUBINUR NEGATIVE 05/30/2020 0843   KETONESUR NEGATIVE 05/30/2020 0843   PROTEINUR NEGATIVE 05/30/2020 0843   NITRITE NEGATIVE 05/30/2020 0843   LEUKOCYTESUR NEGATIVE 05/30/2020 0843     DG Chest 1 View  Result Date: 05/31/2020 CLINICAL DATA:  Heart failure EXAM: CHEST  1 VIEW COMPARISON:  03/07/2018 FINDINGS: Post sternotomy changes. Cardiomegaly with vascular congestion and mild diffuse interstitial opacity, likely edema. Patchy atelectasis left  base. No large effusion. Aortic atherosclerosis. IMPRESSION: 1. Cardiomegaly with vascular congestion and probable mild interstitial edema. 2. Streaky atelectasis at the left lung base. Electronically Signed   By: Donavan Foil M.D.   On: 05/31/2020 19:33   MR ANGIO HEAD WO CONTRAST  Result Date: 05/30/2020 CLINICAL DATA:  Stroke follow-up. Difficulty getting up this morning. EXAM: MRI HEAD WITHOUT CONTRAST MRA HEAD WITHOUT CONTRAST MRA NECK WITHOUT CONTRAST TECHNIQUE: Multiplanar, multiecho pulse sequences of the brain and surrounding structures were obtained without intravenous contrast. Angiographic images of the Circle of Willis were obtained using MRA technique without intravenous contrast. Angiographic images of the neck were obtained using MRA technique without intravenous contrast.  Carotid stenosis measurements (when applicable) are obtained utilizing NASCET criteria, using the distal internal carotid diameter as the denominator. COMPARISON:  Head CT from earlier today. Intracranial MRA report 10/16/2012 FINDINGS: MRI HEAD FINDINGS Brain: Small acute infarcts in the bilateral cerebellum. Equivocal for tiny lacunar infarct in the right pons. There is background chronic small vessel infarcts bilaterally. Small acute infarct in the right occipital cortex. Large remote left MCA branch infarct in the superior division. No acute hemorrhage, hydrocephalus, or masslike finding. Generalized atrophy. No hydrocephalus or collection. Vascular: The right vertebral artery and also is smaller than less hypointense than the left. Skull and upper cervical spine: Negative Sinuses/Orbits: Negative MRA HEAD FINDINGS No flow in the proximal right V4 segment with distal reconstitution that is presumably retrograde, with faint filling of the right PICA. Patent left PICA. Bilateral superior cerebellar arteries are patent. There is a fetal type left PCA. Diffusely patent carotid arteries where visualized. Hypoplastic right A1  segment. No branch occlusion, flow limiting stenosis, or aneurysm in the anterior circulation. MRA NECK FINDINGS Unremarkable arch where covered. There is 3 vessel branching motion artifact diffusely and especially affecting the proximal common carotid arteries. No flow limiting stenosis or ulceration is seen in either carotid circulation. No proximal subclavian again stenosis affected noted by motion artifact, but. No flow is seen throughout the right vertebral artery. The left vertebral artery is smoothly contoured and diffusely patent IMPRESSION: 1. Small acute infarcts in the bilateral cerebellum, right occipital cortex, and possibly right pons. 2. No flow throughout most of the right vertebral artery with distal V4 segment reconstitution and faintly flowing right PICA. The right vertebral artery was also diseased on a 2014 intracranial MRA report. 3. Small remote bilateral cerebellar infarcts suggesting this is a recurrent process. 4. Large remote left MCA branch infarct. 5. Motion degraded neck MRA. Electronically Signed   By: Monte Fantasia M.D.   On: 05/30/2020 10:33   MR ANGIO NECK WO CONTRAST  Result Date: 05/30/2020 CLINICAL DATA:  Stroke follow-up. Difficulty getting up this morning. EXAM: MRI HEAD WITHOUT CONTRAST MRA HEAD WITHOUT CONTRAST MRA NECK WITHOUT CONTRAST TECHNIQUE: Multiplanar, multiecho pulse sequences of the brain and surrounding structures were obtained without intravenous contrast. Angiographic images of the Circle of Willis were obtained using MRA technique without intravenous contrast. Angiographic images of the neck were obtained using MRA technique without intravenous contrast. Carotid stenosis measurements (when applicable) are obtained utilizing NASCET criteria, using the distal internal carotid diameter as the denominator. COMPARISON:  Head CT from earlier today. Intracranial MRA report 10/16/2012 FINDINGS: MRI HEAD FINDINGS Brain: Small acute infarcts in the bilateral  cerebellum. Equivocal for tiny lacunar infarct in the right pons. There is background chronic small vessel infarcts bilaterally. Small acute infarct in the right occipital cortex. Large remote left MCA branch infarct in the superior division. No acute hemorrhage, hydrocephalus, or masslike finding. Generalized atrophy. No hydrocephalus or collection. Vascular: The right vertebral artery and also is smaller than less hypointense than the left. Skull and upper cervical spine: Negative Sinuses/Orbits: Negative MRA HEAD FINDINGS No flow in the proximal right V4 segment with distal reconstitution that is presumably retrograde, with faint filling of the right PICA. Patent left PICA. Bilateral superior cerebellar arteries are patent. There is a fetal type left PCA. Diffusely patent carotid arteries where visualized. Hypoplastic right A1 segment. No branch occlusion, flow limiting stenosis, or aneurysm in the anterior circulation. MRA NECK FINDINGS Unremarkable arch where covered. There is 3 vessel branching motion artifact diffusely and  especially affecting the proximal common carotid arteries. No flow limiting stenosis or ulceration is seen in either carotid circulation. No proximal subclavian again stenosis affected noted by motion artifact, but. No flow is seen throughout the right vertebral artery. The left vertebral artery is smoothly contoured and diffusely patent IMPRESSION: 1. Small acute infarcts in the bilateral cerebellum, right occipital cortex, and possibly right pons. 2. No flow throughout most of the right vertebral artery with distal V4 segment reconstitution and faintly flowing right PICA. The right vertebral artery was also diseased on a 2014 intracranial MRA report. 3. Small remote bilateral cerebellar infarcts suggesting this is a recurrent process. 4. Large remote left MCA branch infarct. 5. Motion degraded neck MRA. Electronically Signed   By: Monte Fantasia M.D.   On: 05/30/2020 10:33   MR BRAIN  WO CONTRAST  Result Date: 05/30/2020 CLINICAL DATA:  Stroke follow-up. Difficulty getting up this morning. EXAM: MRI HEAD WITHOUT CONTRAST MRA HEAD WITHOUT CONTRAST MRA NECK WITHOUT CONTRAST TECHNIQUE: Multiplanar, multiecho pulse sequences of the brain and surrounding structures were obtained without intravenous contrast. Angiographic images of the Circle of Willis were obtained using MRA technique without intravenous contrast. Angiographic images of the neck were obtained using MRA technique without intravenous contrast. Carotid stenosis measurements (when applicable) are obtained utilizing NASCET criteria, using the distal internal carotid diameter as the denominator. COMPARISON:  Head CT from earlier today. Intracranial MRA report 10/16/2012 FINDINGS: MRI HEAD FINDINGS Brain: Small acute infarcts in the bilateral cerebellum. Equivocal for tiny lacunar infarct in the right pons. There is background chronic small vessel infarcts bilaterally. Small acute infarct in the right occipital cortex. Large remote left MCA branch infarct in the superior division. No acute hemorrhage, hydrocephalus, or masslike finding. Generalized atrophy. No hydrocephalus or collection. Vascular: The right vertebral artery and also is smaller than less hypointense than the left. Skull and upper cervical spine: Negative Sinuses/Orbits: Negative MRA HEAD FINDINGS No flow in the proximal right V4 segment with distal reconstitution that is presumably retrograde, with faint filling of the right PICA. Patent left PICA. Bilateral superior cerebellar arteries are patent. There is a fetal type left PCA. Diffusely patent carotid arteries where visualized. Hypoplastic right A1 segment. No branch occlusion, flow limiting stenosis, or aneurysm in the anterior circulation. MRA NECK FINDINGS Unremarkable arch where covered. There is 3 vessel branching motion artifact diffusely and especially affecting the proximal common carotid arteries. No flow  limiting stenosis or ulceration is seen in either carotid circulation. No proximal subclavian again stenosis affected noted by motion artifact, but. No flow is seen throughout the right vertebral artery. The left vertebral artery is smoothly contoured and diffusely patent IMPRESSION: 1. Small acute infarcts in the bilateral cerebellum, right occipital cortex, and possibly right pons. 2. No flow throughout most of the right vertebral artery with distal V4 segment reconstitution and faintly flowing right PICA. The right vertebral artery was also diseased on a 2014 intracranial MRA report. 3. Small remote bilateral cerebellar infarcts suggesting this is a recurrent process. 4. Large remote left MCA branch infarct. 5. Motion degraded neck MRA. Electronically Signed   By: Monte Fantasia M.D.   On: 05/30/2020 10:33   NM Pulmonary Perfusion  Result Date: 05/31/2020 CLINICAL DATA:  Difficulty breathing EXAM: NUCLEAR MEDICINE PERFUSION LUNG SCAN TECHNIQUE: Perfusion images were obtained in multiple projections after intravenous injection of radiopharmaceutical. Ventilation scans intentionally deferred if perfusion scan and chest x-ray adequate for interpretation during COVID 19 epidemic. RADIOPHARMACEUTICALS:  4.4 mCi Tc-55m MAA IV  COMPARISON:  Chest x-ray from earlier in the same day. FINDINGS: Adequate uptake is noted throughout both lungs. No wedge-shaped defects are identified to suggest pulmonary embolism. IMPRESSION: No evidence of pulmonary embolism. Electronically Signed   By: Inez Catalina M.D.   On: 05/31/2020 22:31   ECHOCARDIOGRAM COMPLETE  Result Date: 05/30/2020    ECHOCARDIOGRAM REPORT   Patient Name:   Jeffery Roach Date of Exam: 05/30/2020 Medical Rec #:  [id number]        Height:       72.0 in Accession #:    [id number]       Weight:       241.0 lb Date of Birth:  11/08/1927         BSA:          2.306 m Patient Age:    89 years         BP:           141/107 mmHg Patient Gender: M                HR:            75 bpm. Exam Location:  Inpatient Procedure: 3D Echo, Cardiac Doppler and Color Doppler Indications:    CVA  History:        Patient has no prior history of Echocardiogram examinations and                 Patient has prior history of Echocardiogram examinations, most                 recent 03/08/2018. CHF, CAD and Previous Myocardial Infarction,                 Stroke, Arrythmias:Atrial Fibrillation; Risk                 Factors:Hypertension, Diabetes, Dyslipidemia and obesity.  Sonographer:    Dustin Flock Referring Phys: [id number] [place]  1. Left ventricular ejection fraction, by estimation, is 40 to 45%. The left ventricle has mildly decreased function. The left ventricle demonstrates global hypokinesis. The left ventricular internal cavity size was mildly dilated. Left ventricular diastolic function could not be evaluated. There is the interventricular septum is flattened in systole, consistent with right ventricular pressure overload.  2. Right ventricular systolic function was not well visualized. The right ventricular size is not well visualized. There is severely elevated pulmonary artery systolic pressure.  3. The mitral valve is abnormal. Mild mitral valve regurgitation.  4. Tricuspid valve regurgitation is mild to moderate.  5. The aortic valve is tricuspid. There is moderate calcification of the aortic valve. There is moderate thickening of the aortic valve. Aortic valve regurgitation is moderate.  6. The inferior vena cava is dilated in size with <50% respiratory variability, suggesting right atrial pressure of 15 mmHg. Comparison(s): Prior images unable to be directly viewed, comparison made by report only. Changes from prior study are noted. Echo [facility] 03/08/2018: EF 60-65%, normal RV, no significant valve disease. Conclusion(s)/Recommendation(s): No intracardiac source of embolism detected on this transthoracic study. A transesophageal echocardiogram is recommended to  exclude cardiac source of embolism if clinically indicated. Very difficult windows for imaging. Recommend echo contrast for future studies. Irregular rhythm throughout. Appears globally hypokinetic, change from reported normal EF. Findings reported to Dr. Philipp Ovens and her team via Epic. FINDINGS  Left Ventricle: Left ventricular ejection fraction, by estimation, is 40 to 45%. The left ventricle has mildly decreased function. The left  ventricle demonstrates global hypokinesis. The left ventricular internal cavity size was mildly dilated. There is  borderline left ventricular hypertrophy. The interventricular septum is flattened in systole, consistent with right ventricular pressure overload. Left ventricular diastolic function could not be evaluated due to atrial fibrillation. Left ventricular diastolic function could not be evaluated. Right Ventricle: The right ventricular size is not well visualized. Right vetricular wall thickness was not well visualized. Right ventricular systolic function was not well visualized. There is severely elevated pulmonary artery systolic pressure. The tricuspid regurgitant velocity is 3.53 m/s, and with an assumed right atrial pressure of 15 mmHg, the estimated right ventricular systolic pressure is 65.7 mmHg. Left Atrium: Left atrial size was not well visualized. Right Atrium: Right atrial size was not well visualized. Pericardium: There is no evidence of pericardial effusion. Mitral Valve: The mitral valve is abnormal. Mild mitral valve regurgitation. Tricuspid Valve: The tricuspid valve is normal in structure. Tricuspid valve regurgitation is mild to moderate. Aortic Valve: Restricted movement of noncoronary cusp. The aortic valve is tricuspid. There is moderate calcification of the aortic valve. There is moderate thickening of the aortic valve. Aortic valve regurgitation is moderate. Pulmonic Valve: The pulmonic valve was not well visualized. Pulmonic valve regurgitation is  trivial. Aorta: The aortic arch was not well visualized. Venous: The inferior vena cava is dilated in size with less than 50% respiratory variability, suggesting right atrial pressure of 15 mmHg. IAS/Shunts: The interatrial septum was not well visualized.  LEFT VENTRICLE PLAX 2D LVIDd:         5.60 cm  Diastology LVIDs:         4.30 cm  LV e' medial:    5.87 cm/s LV PW:         1.10 cm  LV E/e' medial:  14.3 LV IVS:        1.10 cm  LV e' lateral:   8.70 cm/s LVOT diam:     2.20 cm  LV E/e' lateral: 9.7 LV SV:         51 LV SV Index:   22 LVOT Area:     3.80 cm  RIGHT VENTRICLE RV Basal diam:  3.10 cm RV S prime:     6.20 cm/s LEFT ATRIUM             Index       RIGHT ATRIUM           Index LA diam:        4.50 cm 1.95 cm/m  RA Area:     16.50 cm LA Vol (A2C):   49.1 ml 21.29 ml/m RA Volume:   42.90 ml  18.60 ml/m LA Vol (A4C):   54.1 ml 23.46 ml/m LA Biplane Vol: 51.7 ml 22.42 ml/m  AORTIC VALVE LVOT Vmax:   77.00 cm/s LVOT Vmean:  39.300 cm/s LVOT VTI:    0.135 m  AORTA Ao Root diam: 3.10 cm MITRAL VALVE               TRICUSPID VALVE MV Area (PHT): 3.99 cm    TR Peak grad:   49.8 mmHg MV Decel Time: 190 msec    TR Vmax:        353.00 cm/s MV E velocity: 84.00 cm/s MV A velocity: 34.30 cm/s  SHUNTS MV E/A ratio:  2.45        Systemic VTI:  0.14 m  Systemic Diam: 2.20 cm Buford Dresser MD Electronically signed by Buford Dresser MD Signature Date/Time: 05/30/2020/5:52:47 PM    Final    CT HEAD CODE STROKE WO CONTRAST  Result Date: 05/30/2020 CLINICAL DATA:  Code stroke. EXAM: CT HEAD WITHOUT CONTRAST TECHNIQUE: Contiguous axial images were obtained from the base of the skull through the vertex without intravenous contrast. COMPARISON:  None available FINDINGS: Brain: Small bilateral cerebellar infarcts not described on a 2020 brain MRI but likely chronic given discrete appearance. Large remote left MCA territory infarct. Generalized brain atrophy. No acute hemorrhage  or hydrocephalus Vascular: No hyperdense vessel or unexpected calcification. Skull: Normal. Negative for fracture or focal lesion. Sinuses/Orbits: No acute finding. Other: These results were communicated to Dr. Leonel Ramsay at 7:38 amon 5/29/2022by text page via the [facility] messaging system. ASPECTS [facility] Stroke Program Early CT Score) Not scored without localizing symptoms IMPRESSION: 1. Large remote left MCA branch infarct. 2. Small bilateral cerebellar infarcts not described on a 2020 report, but favored chronic. Electronically Signed   By: Monte Fantasia M.D.   On: 05/30/2020 07:42    Discharge Instructions: Discharge Instructions    Ambulatory referral to Neurology   Complete by: As directed    Follow up with stroke clinic NP (Jessica Vanschaick or Cecille Rubin, if both not available, consider Zachery Dauer, or [place]) at [facility] in about 4 weeks. Thanks.   Diet - low sodium heart healthy   Complete by: As directed    Discharge instructions   Complete by: As directed    Mr Dada, it was a pleasure taking care of you during your time here. You came in with several strokes and were treated during your stay. You will now be going for rehab here at the hospital.  Please note the following:  1. Continue taking Aspirin daily, Lipitor daily, and Eliquis twice daily as prescribed to prevent further strokes.  2. Continue taking Amiodarone to stabilize your heart rhythm until you see your cardiologist, Dr. Terrence Dupont, in 2 weeks. He will decide if he wants to change your medication or keep this going to control your heart rhythm.  3. Please make an appointment with [facility] in 4 weeks for follow up of your strokes.  4. Please make an appointment with Dr. Terrence Dupont in 2 weeks for follow up of your heart rhythm.  5. Please make an appointment with your Primary Care Doctor in 3 weeks for general follow up.   Increase activity slowly   Complete by: As directed    No dressing  needed   Complete by: As directed      Signed: Virl Axe, MD 06/03/2020, 2:09 PM   Pager: [phone]

## 2020-05-31 NOTE — Progress Notes (Signed)
ANTICOAGULATION CONSULT NOTE - Initial Consult  Pharmacy Consult for apixaban Indication: atrial fibrillation, new stroke  No Known Allergies  Patient Measurements: Height: 6' (182.9 cm) Weight: 104.6 kg (230 lb 9.6 oz) IBW/kg (Calculated) : 77.6  Vital Signs: Temp: 97.6 F (36.4 C) (05/30 1236) Temp Source: Oral (05/30 1236) BP: 154/122 (05/30 1539) Pulse Rate: 85 (05/30 1236)  Labs: Recent Labs    05/30/20 0728 05/30/20 0729 05/31/20 0056  HGB 15.0 14.3 15.7  HCT 44.0 46.0 49.9  PLT  --  190 235  APTT  --  38*  --   LABPROT  --  19.0*  --   INR  --  1.6*  --   CREATININE 1.80* 1.77* 1.60*  TROPONINIHS  --   --  58*    Estimated Creatinine Clearance: 38.4 mL/min (A) (by C-G formula based on SCr of 1.6 mg/dL (H)).   Medical History: Past Medical History:  Diagnosis Date  . Acute on chronic systolic (congestive) heart failure ([place])   . Anemia due to stage 3 chronic kidney disease ([place])   . Arteriosclerotic coronary artery disease   . Atrial fibrillation ([place])   . Benign hypertension with chronic kidney disease, stage III ([place])   . Cerebral artery occlusion with cerebral infarction ([place])   . Cortical age-related cataract of both eyes   . Dermatochalasis of both upper eyelids   . Diabetes mellitus with stage 3 chronic kidney disease ([place])   . Gastroesophageal reflux disease without esophagitis   . History of heart attack   . Hypercalcemia   . Hyperlipidemia   . Hyperopia with astigmatism and presbyopia, bilateral   . Hypertension with goal to be determined   . Hyperthyroidism   . Hypokalemia   . LV dysfunction   . Malignant neoplasm of prostate ([place])   . Metabolic bone disease   . Myocardial infarction ([place])   . Nuclear sclerotic cataract of both eyes   . Personal history of rectal cancer   . Toxic multinodular goiter   . Toxic multinodular goiter   . Transient ischemic attack   . Type 2 diabetes mellitus with hyperglycemia ([place])   . Vitamin D  deficiency   . Vitreous floaters of both eyes     Medications:  Scheduled:  . aspirin EC  81 mg Oral Daily  . atorvastatin  10 mg Oral Daily  . chlorhexidine  15 mL Mouth Rinse BID  . insulin aspart  0-15 Units Subcutaneous TID WC  . insulin aspart  0-5 Units Subcutaneous QHS  . mouth rinse  15 mL Mouth Rinse q12n4p  . metoprolol tartrate  12.5 mg Oral BID  . sodium chloride flush  3 mL Intravenous Once    Assessment: 89yoM on rivaroxaban PTA for atrial fibrillation, found to have new acute stroke felt to be from cardioembolic source. Pharmacy has been consulted to switch rivaroxaban to apixaban.   Last dose of rivaroxaban noted to be PTA on 5/29. CBC stable, no s/sx bleeding. SCr elevated on admission to 1.8, now trending down to 1.6.   Goal of Therapy:  Monitor platelets by anticoagulation protocol: Yes   Plan:  Discontinue rivaroxaban 15mg  PO daily Initiate apixaban 2.5mg  PO twice daily Follow SCr closely and adjust apixaban dose as needed Monitor CBC, s/sx bleeding  Mercy Riding, PharmD PGY1 Acute Care Pharmacy Resident Please refer to [facility] for unit-specific pharmacist

## 2020-05-31 NOTE — Plan of Care (Signed)

## 2020-05-31 NOTE — Evaluation (Signed)
Occupational Therapy Evaluation Patient Details Name: Jeffery Roach MRN: [id number] DOB: 1927/12/26 Today's Date: 05/31/2020    History of Present Illness Pt is a 87 y/o male admitted 05/30/20 with slurred speech. MRI reveals small acute infarct in bilateral cerebellum, R occipital cortex, and R pon; small remote Bil cerebellar infarcts, L remote L MCA branch infarct. MRA revealed no flow through R vertebral artery. PMH includes: CVA with residual R sided deficits and chronic expressive aphasia, afib on xarelto, rectal/prostate cancer, CKD< CAD s/p CABG 2005, CHF, HTN, DM 2.   Clinical Impression   PTA patient reports independent with ADLs and mobility, known hx of R sided weakness and expressive aphasia from prior CVA.  Admitted for above and presenting with deficits below, including impaired balance, decreased activity [place] and impaired vision?.  Patient pleasant, able to follow commands and engage appropriately, but some decreased awareness and problem solving noted.  He reports wearing glasses all the time, but mentions of diplopia during session although depth perception functionally and able to correctly identify numbers/objects with testing.  He is difficult to understand at times, and reports his speech is worse.  Will continue to follow acutely and believe he will best benefit from CIR in order to optimize independence, safety and return to PLOF.     Follow Up Recommendations  CIR    Equipment Recommendations  Other (comment) (TBD at next venue of care)    Recommendations for Other Services       Precautions / Restrictions Precautions Precautions: Fall Precaution Comments: Residual R side deficits from prior stroke Restrictions Weight Bearing Restrictions: No      Mobility Bed Mobility Overal bed mobility: Needs Assistance Bed Mobility: Supine to Sit     Supine to sit: Min assist     General bed mobility comments: Min assist due to posterior lean. Pt initiated  trunk flexion and was able to scoot out fully to EOB with increased time.    Transfers Overall transfer level: Needs assistance Equipment used: None Transfers: Sit to/from Stand Sit to Stand: Min guard         General transfer comment: Hands-on guarding for safety as pt powered up to full stand. No overt LOB noted.    Balance Overall balance assessment: Needs assistance Sitting-balance support: Feet supported;No upper extremity supported Sitting balance-Leahy Scale: Poor Sitting balance - Comments: dynamically able to managed socks, but posterior lean during LB MMT.   Standing balance support: No upper extremity supported;During functional activity Standing balance-Leahy Scale: Fair Standing balance comment: statically                           ADL either performed or assessed with clinical judgement   ADL Overall ADL's : Needs assistance/impaired     Grooming: Minimal assistance;Wash/dry hands;Wash/dry face;Standing Grooming Details (indicate cue type and reason): cueing to sequence and locate objects         Upper Body Dressing : Sitting;Set up Upper Body Dressing Details (indicate cue type and reason): to down back gown Lower Body Dressing: Minimal assistance;Sit to/from stand Lower Body Dressing Details (indicate cue type and reason): able to manage socks, min assist in standing Toilet Transfer: Minimal assistance;Ambulation Toilet Transfer Details (indicate cue type and reason): no AD, simulated         Functional mobility during ADLs: Minimal assistance;Cueing for safety General ADL Comments: pt limtied by balance, activity tolerance     Vision Baseline Vision/History: Wears glasses Wears Glasses: At  all times Patient Visual Report: No change from baseline Vision Assessment?: Vision impaired- to be further tested in functional context Additional Comments: pt able to read numbers for room, correctly idenifty # of fingers presented, and  demonstrates good depth perception funcitonally but voices seeing two during session.  continue assessment.     Perception     Praxis      Pertinent Vitals/Pain Pain Assessment: No/denies pain     Hand Dominance Left (reports was Right handed but switched since CVA)   Extremity/Trunk Assessment Upper Extremity Assessment Upper Extremity Assessment: RUE deficits/detail RUE Deficits / Details: baseline weakness, decreased functional use from chronic CVA with limited shoulder ROM and decreased coordination RUE Coordination: decreased fine motor;decreased gross motor   Lower Extremity Assessment Lower Extremity Assessment: Defer to PT evaluation RLE Deficits / Details: Bilaterally, 5/5 strength in quads, hamstrings, and ankle dorsiflexors. 4+/5 strength in B hip flexors. RLE Sensation: decreased light touch   Cervical / Trunk Assessment Cervical / Trunk Assessment: Other exceptions Cervical / Trunk Exceptions: Mild forward head posture with rounded shoulders.   Communication Communication Communication: Expressive difficulties   Cognition Arousal/Alertness: Awake/alert Behavior During Therapy: WFL for tasks assessed/performed Overall Cognitive Status: Impaired/Different from baseline Area of Impairment: Problem solving;Awareness                           Awareness: Emergent Problem Solving: Slow processing;Requires verbal cues;Difficulty sequencing General Comments: slow processing and some decreased awareness (initally reports feeling normal) to deficits, but difficult to understand at times   General Comments  pt HR up to 180s with mobility, watch HR    Exercises     Shoulder Instructions      Home Living Family/patient expects to be discharged to:: Private residence Living Arrangements: Spouse/significant other Available Help at Discharge: Family;Available 24 hours/day Type of Home: House Home Access: Stairs to enter [employer] of Steps: 2    Home Layout: One level     Bathroom Shower/Tub: Occupational psychologist: Standard     Home Equipment: Cane - single point          Prior Functioning/Environment Level of Independence: Independent        Comments: independent with ADLs, mobility; spouse completes IADLs and med mgmt        OT Problem List: Decreased activity tolerance;Impaired balance (sitting and/or standing);Decreased safety awareness;Decreased cognition;Decreased knowledge of use of DME or AE;Decreased knowledge of precautions;Impaired UE functional use      OT Treatment/Interventions: Self-care/ADL training;Cognitive remediation/compensation;Visual/perceptual remediation/compensation;Balance training;Patient/family education;Therapeutic activities;Neuromuscular education;DME and/or AE instruction    OT Goals(Current goals can be found in the care plan section) Acute Rehab OT Goals Patient Stated Goal: Return home OT Goal Formulation: With patient Time For Goal Achievement: 06/14/20 Potential to Achieve Goals: Good  OT Frequency: Min 2X/week   Barriers to D/C:            Co-evaluation PT/OT/SLP Co-Evaluation/Treatment: Yes Reason for Co-Treatment: To address functional/ADL transfers   OT goals addressed during session: ADL's and self-care      AM-PAC OT "6 Clicks" Daily Activity     Outcome Measure Help from another person eating meals?: None Help from another person taking care of personal grooming?: A Little Help from another person toileting, which includes using toliet, bedpan, or urinal?: A Little Help from another person bathing (including washing, rinsing, drying)?: A Little Help from another person to put on and taking off regular upper body  clothing?: A Little Help from another person to put on and taking off regular lower body clothing?: A Little 6 Click Score: 19   End of Session Equipment Utilized During Treatment: Gait belt Nurse Communication: Mobility  status  Activity Tolerance: Patient tolerated treatment well Patient left: in chair;with call bell/phone within reach;with chair alarm set  OT Visit Diagnosis: Other abnormalities of gait and mobility (R26.89);History of falling (Z91.81);Muscle weakness (generalized) (M62.81);Cognitive communication deficit (R41.841) Symptoms and signs involving cognitive functions: Cerebral infarction                Time: 6468-0321 OT Time Calculation (min): 37 min Charges:  OT General Charges $OT Visit: 1 Visit OT Evaluation $OT Eval Moderate Complexity: 1 Mod  Jolaine Artist, OT Acute Rehabilitation Services Pager [phone] Office [phone]   Delight Stare 05/31/2020, 11:33 AM

## 2020-05-31 NOTE — Evaluation (Addendum)
Speech Language Pathology Evaluation Patient Details Name: Jeffery Roach MRN: [id number] DOB: 1927/09/29 Today's Date: 05/31/2020 Time: 2956-2130 SLP Time Calculation (min) (ACUTE ONLY): 20 min  Problem List:  Patient Active Problem List   Diagnosis Date Noted  . Acute CVA (cerebrovascular accident) ([place]) 05/30/2020  . Right sided weakness 03/17/2018  . History of CVA with residual deficit 03/17/2018  . Leukocytosis 03/17/2018  . Chronic atrial fibrillation ([place]) 03/17/2018  . Hypertension associated with diabetes ([place]) 03/17/2018  . Diabetes mellitus type II, controlled ([place]) 03/17/2018  . Hyperlipidemia associated with type 2 diabetes mellitus ([place]) 03/17/2018   Past Medical History:  Past Medical History:  Diagnosis Date  . Acute on chronic systolic (congestive) heart failure ([place])   . Anemia due to stage 3 chronic kidney disease ([place])   . Arteriosclerotic coronary artery disease   . Atrial fibrillation ([place])   . Benign hypertension with chronic kidney disease, stage III ([place])   . Cerebral artery occlusion with cerebral infarction ([place])   . Cortical age-related cataract of both eyes   . Dermatochalasis of both upper eyelids   . Diabetes mellitus with stage 3 chronic kidney disease ([place])   . Gastroesophageal reflux disease without esophagitis   . History of heart attack   . Hypercalcemia   . Hyperlipidemia   . Hyperopia with astigmatism and presbyopia, bilateral   . Hypertension with goal to be determined   . Hyperthyroidism   . Hypokalemia   . LV dysfunction   . Malignant neoplasm of prostate ([place])   . Metabolic bone disease   . Myocardial infarction ([place])   . Nuclear sclerotic cataract of both eyes   . Personal history of rectal cancer   . Toxic multinodular goiter   . Toxic multinodular goiter   . Transient ischemic attack   . Type 2 diabetes mellitus with hyperglycemia ([place])   . Vitamin D deficiency   . Vitreous floaters of both eyes    Past Surgical  History:  Past Surgical History:  Procedure Laterality Date  . CARDIAC SURGERY    . COLOPROCTECTOMY    . CORONARY ARTERY BYPASS GRAFT    . GASTROSTOMY TUBE PLACEMENT    . INGUINAL HERNIA REPAIR    . TONSILLECTOMY     HPI:  Pt is a 87 y/o male admitted 05/30/20 with slurred speech. MRI reveals small acute infarct in bilateral cerebellum, R occipital cortex, and R pon; small remote Bil cerebellar infarcts, L remote L MCA branch infarct. MRA revealed no flow through R vertebral artery. PMH includes: CVA with residual R sided deficits and chronic expressive aphasia, afib on xarelto, rectal/prostate cancer, CKD< CAD s/p CABG 2005, CHF, HTN, DM 2.   Assessment / Plan / Recommendation Clinical Impression  Patient presents with a mild expressive aphasia, mod-severe flaccid dysarthria and questional cognitive impairments. Language and cognitive evaluations both limited by patient's speech intelligiblity, with intelligibilty less than 50% at phrase and word level. Dysarthria primarily secondary to significant decreased ROM and strength of tongue. He followed one and two step basic level commands, identified object pictures in fielf of 7 without difficulty. He was oriented to day of week and likely was oriented to year however he said what sounded like "1922". Expressive aphasia, which appears mild was characterized by instances of word finding errors in connected speech and when naming less common objects. SLP spoke with patient's son on phone after evaluation and was informed that prior to current hospitalization, patient appeared cognitively intact but his speech apparently had  not improved significantly since having therapy at [facility]. SLP is recommending for patient to be followed acutely and at next venue of care (CIR or [facility]) to maximize his speech intelligibility but also to develop a better system of multimodal communication.    SLP Assessment  SLP Recommendation/Assessment: Patient needs  continued Speech Lanaguage Pathology Services SLP Visit Diagnosis: Dysarthria and anarthria (R47.1);Aphasia (R47.01)    Follow Up Recommendations  Inpatient Rehab;Other (comment) (HH if not going to get CIR)    Frequency and Duration min 2x/week  1 week      SLP Evaluation Cognition  Overall Cognitive Status: Difficult to assess Orientation Level: Oriented to person;Oriented to situation;Disoriented to place;Disoriented to time Attention: Sustained Sustained Attention: Appears intact Awareness: Impaired Awareness Impairment: Emergent impairment Safety/Judgment: Appears intact       Comprehension  Auditory Comprehension Overall Auditory Comprehension: Appears within functional limits for tasks assessed    Expression Expression Primary Mode of Expression: Verbal Verbal Expression Overall Verbal Expression: Impaired Initiation: No impairment Level of Generative/Spontaneous Verbalization: Phrase Repetition: No impairment Naming: Impairment Responsive: 51-75% accurate Confrontation: Impaired Convergent: Not tested Divergent: Not tested Verbal Errors: Phonemic paraphasias;Aware of errors Pragmatics: No impairment Interfering Components: Speech intelligibility Non-Verbal Means of Communication: Not applicable Written Expression Dominant Hand: Left Written Expression: Not tested   Oral / Motor  Oral Motor/Sensory Function Overall Oral Motor/Sensory Function: Moderate impairment Facial ROM: Reduced right Facial Symmetry: Abnormal symmetry right Facial Strength: Reduced right Lingual ROM: Reduced right;Reduced left Lingual Strength: Reduced Velum: Within Functional Limits Mandible: Within Functional Limits Motor Speech Overall Motor Speech: Impaired Respiration: Within functional limits Phonation: Normal Resonance: Within functional limits Articulation: Impaired Level of Impairment: Word Intelligibility: Intelligibility reduced Word: 25-49% accurate Phrase: 25-49%  accurate Sentence: 0-24% accurate Conversation: 0-24% accurate Motor Planning: Witnin functional limits Motor Speech Errors: Not applicable Interfering Components: Premorbid status Effective Techniques: Slow rate;Increased vocal intensity   GO                   Sonia Baller, MA, CCC-SLP Speech Therapy

## 2020-05-31 NOTE — Progress Notes (Signed)
Inpatient Rehab Admissions Coordinator Note:   Per therapy recommendations, pt was screened for CIR candidacy by Clemens Catholic, [place] CCC-SLP. At this time, Pt. Appears to have slight functional decline. He is currently observation status, but MD may place a CIR consult order if admitted as inpatient. I will not place CIR consult while Pt. Remains on observation status. Please contact me with questions.   Clemens Catholic, [place], [place] Admissions Coordinator  [phone] ([place]) [phone] (office)

## 2020-05-31 NOTE — Progress Notes (Signed)
Subjective:  HD1  Patient is sitting up in bedside chair this morning. He reports being able to walk around the unit some earlier today. He is feeling hungry - discussed waiting for SLP eval. He still reports some right sided face numbness. Discussed will need continual medical work-up at this time.  Objective:  Vital signs in last 24 hours: Vitals:   05/31/20 0115 05/31/20 0145 05/31/20 0229 05/31/20 0351  BP: (!) 148/91 (!) 148/97 122/81 (!) 156/88  Pulse: (!) 51 87  86  Resp: (!) 22 15 17 18   Temp:   97.7 F (36.5 C) 98.5 F (36.9 C)  TempSrc:   Oral Oral  SpO2: 97% 97% 96% 95%  Height:   6' (1.829 m)    Physical Exam: General: Sitting up in chair in no acute distress CV: Irregular rate, rhythm. No murmurs, gallops, rubs. Pulm: Normal work of breathing on room air. Neuro: Awake, alert, answering questions appropriately. Expressive aphasia and dysarthria present. Decreased sensation on right side of face, RUE, RLE. Motor 4/5 RUE. Motor 5/5 throughout rest of extremities. No dysmetria.  CBC Latest Ref Rng & Units 05/31/2020 05/30/2020 05/30/2020  WBC 4.0 - 10.5 K/uL 11.9(H) 11.3(H) -  Hemoglobin 13.0 - 17.0 g/dL 15.7 14.3 15.0  Hematocrit 39.0 - 52.0 % 49.9 46.0 44.0  Platelets 150 - 400 K/uL 235 190 -   BMP Latest Ref Rng & Units 05/31/2020 05/30/2020 05/30/2020  Glucose 70 - 99 mg/dL 125(H) 133(H) 130(H)  BUN 8 - 23 mg/dL 24(H) 24(H) 31(H)  Creatinine 0.61 - 1.24 mg/dL 1.60(H) 1.77(H) 1.80(H)  Sodium 135 - 145 mmol/L 136 137 140  Potassium 3.5 - 5.1 mmol/L 5.0 4.5 4.4  Chloride 98 - 111 mmol/L 105 105 107  CO2 22 - 32 mmol/L 23 25 -  Calcium 8.9 - 10.3 mg/dL 9.1 8.8(L) -   Assessment/Plan: Trayce Maino is 87yo c[place] with prior CVA with residual right-sided deficits, chronic atrial fibrillation on Xarelto, rectal cancer, prostate cancer, CAD s/p CABG 2005, CKDIIIb, chronic diastolic heart failure (EF 60-65% in 03/2018), hypertension, type 2 diabetes mellitus admitted  1/76 for 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  Active Problems:   Acute CVA (cerebrovascular accident) ([place])  #Acute CVA, likely embolic Overnight patient has remained hemodynamically stable. No change in neurological deficits from admission. Appreciate neurology's recommendations. Pending further SLP eval, will place diet afterward. PT/OT currently recommending [facility]. - ASA 81mg  daily - PT/OT/SLP evals - Follow-up with CIR  #Severe pulmonary hypertension #Chronic diastolic heart failure TTE yesterday revealed severe pulmonary arterial hypertension with right ventricular pressure overload. Per chart review, no previous mention of PAH mentioned. He is high risk of DVT/PE with active malignancy, but patient is asymptomatic and reports compliance with his home Xarelto. He has no known history of DVT/PE or primary lung disease. There is no known diagnosis of OSA but cannot rule-out given body habitus. Will obtain V/Q scan today for further evaluation. - Follow-up V/Q scan - Daily CBC, BMP  #Chronic atrial fibrillation on Xarelto #High PVC burden Patient has chronic atrial fibrillation, which has continued while in the hospital. Patient is currently being monitored via telemetry, which has shown high PVC burden with a few runs of non-sustained ventricular tachycardia. At home patient is on diltiazem and metoprolol. These were initially held in the setting of a stroke. Currently holding given patient's HR is in low 60's. Cardiology has been consulted for further evaluation and assistance. - Follow-up cardiology recommendations -  Hold home metoprolol and diltiazem - Xarelto 20mg  daily - Tele  #Chronic kidney disease stage IIIb Renal function has remained at baseline since admission.  - Daily BMP - I/O - Avoid nephrotoxins  #Type 2 diabetes mellitus Most recent A1c 8.0% a few days ago. Glucose has been at goal, no changes necessary at this  time. - CBG monitoring - SSI  DIET: NPO pending SLP eval IVF: n/a DVT PPX: Xarelto BOWEL: Senokot-S CODE: FULL FAM COM: Will call patient's wife this afternoon.  Prior to Admission Living Arrangement: Home Anticipated Discharge Location: [facility] Barriers to Discharge: medical management Dispo: Anticipated discharge in approximately 1-2 day(s).   Sanjuan Dame, MD 05/31/2020, 6:14 AM Pager: [phone] After 5pm on weekdays and 1pm on weekends: On Call pager [phone]

## 2020-05-31 NOTE — Consult Note (Signed)
Reason for Consult: Atrial fibrillation with paroxysmal rapid ventricular response and slow ventricular response and frequent PVCs and occasional nonsustained VT Bilateral cerebellar cardioembolic CVA Referring Physician: Teaching service  Jeffery Roach is an 87 y.o. male.  HPI: Patient is 87 year old male with past medical history significant for multiple medical problems i.e. coronary artery disease history of MI in the past status post CABG, history of left MCA.  Infarct with right residual limb paresis hypertension, diabetes mellitus, chronic kidney disease stage III, GERD, history of carcinoma of the prostate And rectal carcinoma, chronic kidney disease stage III anemia of chronic disease, hypothyroidism, chronic atrial fibrillation CHA2DS2-VASc score of 8 on chronic anticoagulation was admitted because of slurred speech and was noted to have new bilateral cerebellar infarct.  Cardiology consultation was called as patient was noted to have chronic atrial fibrillation with slow ventricular response and frequent PVCs and occasional nonsustained VT and was noted to have depressed LV systolic function with global hypokinesis EF with EF of 40 to 45% and pulmonary hypertension which is new.  Patient presently is significantly dysarthric very minimally history could be obtained but patient denies any chest pain or shortness of breath.  Denies palpitation lightheadedness or syncope.  Denies PND orthopnea or leg swelling.  States his activity is very limited.  Most of the history obtained was from chart review.  Past Medical History:  Diagnosis Date  . Acute on chronic systolic (congestive) heart failure ([place])   . Anemia due to stage 3 chronic kidney disease ([place])   . Arteriosclerotic coronary artery disease   . Atrial fibrillation ([place])   . Benign hypertension with chronic kidney disease, stage III ([place])   . Cerebral artery occlusion with cerebral infarction ([place])   . Cortical age-related cataract of  both eyes   . Dermatochalasis of both upper eyelids   . Diabetes mellitus with stage 3 chronic kidney disease ([place])   . Gastroesophageal reflux disease without esophagitis   . History of heart attack   . Hypercalcemia   . Hyperlipidemia   . Hyperopia with astigmatism and presbyopia, bilateral   . Hypertension with goal to be determined   . Hyperthyroidism   . Hypokalemia   . LV dysfunction   . Malignant neoplasm of prostate ([place])   . Metabolic bone disease   . Myocardial infarction ([place])   . Nuclear sclerotic cataract of both eyes   . Personal history of rectal cancer   . Toxic multinodular goiter   . Toxic multinodular goiter   . Transient ischemic attack   . Type 2 diabetes mellitus with hyperglycemia ([place])   . Vitamin D deficiency   . Vitreous floaters of both eyes     Past Surgical History:  Procedure Laterality Date  . CARDIAC SURGERY    . COLOPROCTECTOMY    . CORONARY ARTERY BYPASS GRAFT    . GASTROSTOMY TUBE PLACEMENT    . INGUINAL HERNIA REPAIR    . TONSILLECTOMY      Family History  Problem Relation Age of Onset  . Diabetes Sister   . Stroke Sister   . Hypertension Sister     Social History:  reports that he has quit smoking. He has never used smokeless tobacco. No history on file for alcohol use and drug use.  Allergies: No Known Allergies  Medications: I have reviewed the patient's current medications.  Results for orders placed or performed during the hospital encounter of 05/30/20 (from the past 48 hour(s))  CBG monitoring, ED  Status: Abnormal   Collection Time: 05/30/20  7:21 AM  Result Value Ref Range   Glucose-Capillary 117 (H) 70 - 99 mg/dL    Comment: Glucose reference range applies only to samples taken after fasting for at least 8 hours.  I-stat chem 8, ED     Status: Abnormal   Collection Time: 05/30/20  7:28 AM  Result Value Ref Range   Sodium 140 135 - 145 mmol/L   Potassium 4.4 3.5 - 5.1 mmol/L   Chloride 107 98 - 111 mmol/L    BUN 31 (H) 8 - 23 mg/dL   Creatinine, Ser 1.80 (H) 0.61 - 1.24 mg/dL   Glucose, Bld 130 (H) 70 - 99 mg/dL    Comment: Glucose reference range applies only to samples taken after fasting for at least 8 hours.   Calcium, Ion 1.09 (L) 1.15 - 1.40 mmol/L   TCO2 26 22 - 32 mmol/L   Hemoglobin 15.0 13.0 - 17.0 g/dL   HCT 44.0 39.0 - 52.0 %  Protime-INR     Status: Abnormal   Collection Time: 05/30/20  7:29 AM  Result Value Ref Range   Prothrombin Time 19.0 (H) 11.4 - 15.2 seconds   INR 1.6 (H) 0.8 - 1.2    Comment: (NOTE) INR goal varies based on device and disease states. Performed at [facility], [place] [address] [place], [place] [address]   APTT     Status: Abnormal   Collection Time: 05/30/20  7:29 AM  Result Value Ref Range   aPTT 38 (H) 24 - 36 seconds    Comment:        IF BASELINE aPTT IS ELEVATED, SUGGEST PATIENT RISK ASSESSMENT BE USED TO DETERMINE APPROPRIATE ANTICOAGULANT THERAPY. Performed at [facility], [place] [address], [place], [place] [address]   CBC     Status: Abnormal   Collection Time: 05/30/20  7:29 AM  Result Value Ref Range   WBC 11.3 (H) 4.0 - 10.5 K/uL   RBC 5.08 4.22 - 5.81 MIL/uL   Hemoglobin 14.3 13.0 - 17.0 g/dL   HCT 46.0 39.0 - 52.0 %   MCV 90.6 80.0 - 100.0 fL   MCH 28.1 26.0 - 34.0 pg   MCHC 31.1 30.0 - 36.0 g/dL   RDW 14.4 11.5 - 15.5 %   Platelets 190 150 - 400 K/uL   nRBC 0.0 0.0 - 0.2 %    Comment: Performed at [facility], [place] [address], [place], [place] [address]  Differential     Status: Abnormal   Collection Time: 05/30/20  7:29 AM  Result Value Ref Range   Neutrophils Relative % 53 %   Neutro Abs 6.0 1.7 - 7.7 K/uL   Lymphocytes Relative 37 %   Lymphs Abs 4.1 (H) 0.7 - 4.0 K/uL   Monocytes Relative 6 %   Monocytes Absolute 0.6 0.1 - 1.0 K/uL   Eosinophils Relative 4 %   Eosinophils Absolute 0.4 0.0 - 0.5 K/uL   Basophils Relative 0 %   Basophils Absolute 0.1 0.0 - 0.1 K/uL   Immature Granulocytes 0 %    Abs Immature Granulocytes 0.04 0.00 - 0.07 K/uL    Comment: Performed at [facility], [place] [address] [place], [place] [address]  Comprehensive metabolic panel     Status: Abnormal   Collection Time: 05/30/20  7:29 AM  Result Value Ref Range   Sodium 137 135 - 145 mmol/L   Potassium 4.5 3.5 -  5.1 mmol/L   Chloride 105 98 - 111 mmol/L   CO2 25 22 - 32 mmol/L   Glucose, Bld 133 (H) 70 - 99 mg/dL    Comment: Glucose reference range applies only to samples taken after fasting for at least 8 hours.   BUN 24 (H) 8 - 23 mg/dL   Creatinine, Ser 1.77 (H) 0.61 - 1.24 mg/dL   Calcium 8.8 (L) 8.9 - 10.3 mg/dL   Total Protein 7.7 6.5 - 8.1 g/dL   Albumin 3.3 (L) 3.5 - 5.0 g/dL   AST 24 15 - 41 U/L   ALT 21 0 - 44 U/L   Alkaline Phosphatase 64 38 - 126 U/L   Total Bilirubin 1.0 0.3 - 1.2 mg/dL   GFR, Estimated 36 (L) >60 mL/min    Comment: (NOTE) Calculated using the CKD-EPI Creatinine Equation (2021)    Anion gap 7 5 - 15    Comment: Performed at [facility], [place] [address], [place], [place] [address]  Urinalysis, Routine w reflex microscopic Urine, Clean Catch     Status: Abnormal   Collection Time: 05/30/20  8:43 AM  Result Value Ref Range   Color, Urine YELLOW YELLOW   APPearance CLEAR CLEAR   Specific Gravity, Urine 1.012 1.005 - 1.030   pH 8.0 5.0 - 8.0   Glucose, UA >=500 (A) NEGATIVE mg/dL   Hgb urine dipstick SMALL (A) NEGATIVE   Bilirubin Urine NEGATIVE NEGATIVE   Ketones, ur NEGATIVE NEGATIVE mg/dL   Protein, ur NEGATIVE NEGATIVE mg/dL   Nitrite NEGATIVE NEGATIVE   Leukocytes,Ua NEGATIVE NEGATIVE   RBC / HPF 6-10 0 - 5 RBC/hpf   WBC, UA 0-5 0 - 5 WBC/hpf   Bacteria, UA NONE SEEN NONE SEEN    Comment: Performed at [facility] [address] [place], [place] [address]  Comprehensive metabolic panel     Status: Abnormal   Collection Time: 05/31/20 12:56 AM  Result Value Ref Range   Sodium 136 135 - 145 mmol/L   Potassium 5.0 3.5 - 5.1 mmol/L    Chloride 105 98 - 111 mmol/L   CO2 23 22 - 32 mmol/L   Glucose, Bld 125 (H) 70 - 99 mg/dL    Comment: Glucose reference range applies only to samples taken after fasting for at least 8 hours.   BUN 24 (H) 8 - 23 mg/dL   Creatinine, Ser 1.60 (H) 0.61 - 1.24 mg/dL   Calcium 9.1 8.9 - 10.3 mg/dL   Total Protein 8.4 (H) 6.5 - 8.1 g/dL   Albumin 3.4 (L) 3.5 - 5.0 g/dL   AST 38 15 - 41 U/L   ALT 23 0 - 44 U/L   Alkaline Phosphatase 77 38 - 126 U/L   Total Bilirubin 1.5 (H) 0.3 - 1.2 mg/dL   GFR, Estimated 41 (L) >60 mL/min    Comment: (NOTE) Calculated using the CKD-EPI Creatinine Equation (2021)    Anion gap 8 5 - 15    Comment: Performed at [facility], [place] [address], [place], [place] [address]  CBC with Differential/Platelet     Status: Abnormal   Collection Time: 05/31/20 12:56 AM  Result Value Ref Range   WBC 11.9 (H) 4.0 - 10.5 K/uL   RBC 5.60 4.22 - 5.81 MIL/uL   Hemoglobin 15.7 13.0 - 17.0 g/dL   HCT 49.9 39.0 - 52.0 %   MCV 89.1 80.0 - 100.0 fL   MCH 28.0 26.0 - 34.0 pg  MCHC 31.5 30.0 - 36.0 g/dL   RDW 14.6 11.5 - 15.5 %   Platelets 235 150 - 400 K/uL   nRBC 0.0 0.0 - 0.2 %   Neutrophils Relative % 68 %   Neutro Abs 8.1 (H) 1.7 - 7.7 K/uL   Lymphocytes Relative 23 %   Lymphs Abs 2.8 0.7 - 4.0 K/uL   Monocytes Relative 6 %   Monocytes Absolute 0.7 0.1 - 1.0 K/uL   Eosinophils Relative 3 %   Eosinophils Absolute 0.3 0.0 - 0.5 K/uL   Basophils Relative 0 %   Basophils Absolute 0.1 0.0 - 0.1 K/uL   Immature Granulocytes 0 %   Abs Immature Granulocytes 0.03 0.00 - 0.07 K/uL    Comment: Performed at [facility] [address], [place], [place] [address]  TSH     Status: None   Collection Time: 05/31/20 12:56 AM  Result Value Ref Range   TSH 4.152 0.350 - 4.500 uIU/mL    Comment: Performed by a 3rd Generation assay with a functional sensitivity of <=0.01 uIU/mL. Performed at [facility], [place] [address] [place], [place] [address]   Troponin I  (High Sensitivity)     Status: Abnormal   Collection Time: 05/31/20 12:56 AM  Result Value Ref Range   Troponin I (High Sensitivity) 58 (H) <18 ng/L    Comment: (NOTE) Elevated high sensitivity troponin I (hsTnI) values and significant  changes across serial measurements may suggest ACS but many other  chronic and acute conditions are known to elevate hsTnI results.  Refer to the "Links" section for chest pain algorithms and additional  guidance. Performed at [facility], [place] [address], [place], [place] [address]     MR ANGIO HEAD WO CONTRAST  Result Date: 05/30/2020 CLINICAL DATA:  Stroke follow-up. Difficulty getting up this morning. EXAM: MRI HEAD WITHOUT CONTRAST MRA HEAD WITHOUT CONTRAST MRA NECK WITHOUT CONTRAST TECHNIQUE: Multiplanar, multiecho pulse sequences of the brain and surrounding structures were obtained without intravenous contrast. Angiographic images of the Circle of Willis were obtained using MRA technique without intravenous contrast. Angiographic images of the neck were obtained using MRA technique without intravenous contrast. Carotid stenosis measurements (when applicable) are obtained utilizing NASCET criteria, using the distal internal carotid diameter as the denominator. COMPARISON:  Head CT from earlier today. Intracranial MRA report 10/16/2012 FINDINGS: MRI HEAD FINDINGS Brain: Small acute infarcts in the bilateral cerebellum. Equivocal for tiny lacunar infarct in the right pons. There is background chronic small vessel infarcts bilaterally. Small acute infarct in the right occipital cortex. Large remote left MCA branch infarct in the superior division. No acute hemorrhage, hydrocephalus, or masslike finding. Generalized atrophy. No hydrocephalus or collection. Vascular: The right vertebral artery and also is smaller than less hypointense than the left. Skull and upper cervical spine: Negative Sinuses/Orbits: Negative MRA HEAD FINDINGS No flow in the proximal  right V4 segment with distal reconstitution that is presumably retrograde, with faint filling of the right PICA. Patent left PICA. Bilateral superior cerebellar arteries are patent. There is a fetal type left PCA. Diffusely patent carotid arteries where visualized. Hypoplastic right A1 segment. No branch occlusion, flow limiting stenosis, or aneurysm in the anterior circulation. MRA NECK FINDINGS Unremarkable arch where covered. There is 3 vessel branching motion artifact diffusely and especially affecting the proximal common carotid arteries. No flow limiting stenosis or ulceration is seen in either carotid circulation. No proximal subclavian again stenosis affected noted by motion artifact, but. No  flow is seen throughout the right vertebral artery. The left vertebral artery is smoothly contoured and diffusely patent IMPRESSION: 1. Small acute infarcts in the bilateral cerebellum, right occipital cortex, and possibly right pons. 2. No flow throughout most of the right vertebral artery with distal V4 segment reconstitution and faintly flowing right PICA. The right vertebral artery was also diseased on a 2014 intracranial MRA report. 3. Small remote bilateral cerebellar infarcts suggesting this is a recurrent process. 4. Large remote left MCA branch infarct. 5. Motion degraded neck MRA. Electronically Signed   By: Monte Fantasia M.D.   On: 05/30/2020 10:33   MR ANGIO NECK WO CONTRAST  Result Date: 05/30/2020 CLINICAL DATA:  Stroke follow-up. Difficulty getting up this morning. EXAM: MRI HEAD WITHOUT CONTRAST MRA HEAD WITHOUT CONTRAST MRA NECK WITHOUT CONTRAST TECHNIQUE: Multiplanar, multiecho pulse sequences of the brain and surrounding structures were obtained without intravenous contrast. Angiographic images of the Circle of Willis were obtained using MRA technique without intravenous contrast. Angiographic images of the neck were obtained using MRA technique without intravenous contrast. Carotid stenosis  measurements (when applicable) are obtained utilizing NASCET criteria, using the distal internal carotid diameter as the denominator. COMPARISON:  Head CT from earlier today. Intracranial MRA report 10/16/2012 FINDINGS: MRI HEAD FINDINGS Brain: Small acute infarcts in the bilateral cerebellum. Equivocal for tiny lacunar infarct in the right pons. There is background chronic small vessel infarcts bilaterally. Small acute infarct in the right occipital cortex. Large remote left MCA branch infarct in the superior division. No acute hemorrhage, hydrocephalus, or masslike finding. Generalized atrophy. No hydrocephalus or collection. Vascular: The right vertebral artery and also is smaller than less hypointense than the left. Skull and upper cervical spine: Negative Sinuses/Orbits: Negative MRA HEAD FINDINGS No flow in the proximal right V4 segment with distal reconstitution that is presumably retrograde, with faint filling of the right PICA. Patent left PICA. Bilateral superior cerebellar arteries are patent. There is a fetal type left PCA. Diffusely patent carotid arteries where visualized. Hypoplastic right A1 segment. No branch occlusion, flow limiting stenosis, or aneurysm in the anterior circulation. MRA NECK FINDINGS Unremarkable arch where covered. There is 3 vessel branching motion artifact diffusely and especially affecting the proximal common carotid arteries. No flow limiting stenosis or ulceration is seen in either carotid circulation. No proximal subclavian again stenosis affected noted by motion artifact, but. No flow is seen throughout the right vertebral artery. The left vertebral artery is smoothly contoured and diffusely patent IMPRESSION: 1. Small acute infarcts in the bilateral cerebellum, right occipital cortex, and possibly right pons. 2. No flow throughout most of the right vertebral artery with distal V4 segment reconstitution and faintly flowing right PICA. The right vertebral artery was also  diseased on a 2014 intracranial MRA report. 3. Small remote bilateral cerebellar infarcts suggesting this is a recurrent process. 4. Large remote left MCA branch infarct. 5. Motion degraded neck MRA. Electronically Signed   By: Monte Fantasia M.D.   On: 05/30/2020 10:33   MR BRAIN WO CONTRAST  Result Date: 05/30/2020 CLINICAL DATA:  Stroke follow-up. Difficulty getting up this morning. EXAM: MRI HEAD WITHOUT CONTRAST MRA HEAD WITHOUT CONTRAST MRA NECK WITHOUT CONTRAST TECHNIQUE: Multiplanar, multiecho pulse sequences of the brain and surrounding structures were obtained without intravenous contrast. Angiographic images of the Circle of Willis were obtained using MRA technique without intravenous contrast. Angiographic images of the neck were obtained using MRA technique without intravenous contrast. Carotid stenosis measurements (when applicable) are obtained utilizing NASCET criteria,  using the distal internal carotid diameter as the denominator. COMPARISON:  Head CT from earlier today. Intracranial MRA report 10/16/2012 FINDINGS: MRI HEAD FINDINGS Brain: Small acute infarcts in the bilateral cerebellum. Equivocal for tiny lacunar infarct in the right pons. There is background chronic small vessel infarcts bilaterally. Small acute infarct in the right occipital cortex. Large remote left MCA branch infarct in the superior division. No acute hemorrhage, hydrocephalus, or masslike finding. Generalized atrophy. No hydrocephalus or collection. Vascular: The right vertebral artery and also is smaller than less hypointense than the left. Skull and upper cervical spine: Negative Sinuses/Orbits: Negative MRA HEAD FINDINGS No flow in the proximal right V4 segment with distal reconstitution that is presumably retrograde, with faint filling of the right PICA. Patent left PICA. Bilateral superior cerebellar arteries are patent. There is a fetal type left PCA. Diffusely patent carotid arteries where visualized. Hypoplastic  right A1 segment. No branch occlusion, flow limiting stenosis, or aneurysm in the anterior circulation. MRA NECK FINDINGS Unremarkable arch where covered. There is 3 vessel branching motion artifact diffusely and especially affecting the proximal common carotid arteries. No flow limiting stenosis or ulceration is seen in either carotid circulation. No proximal subclavian again stenosis affected noted by motion artifact, but. No flow is seen throughout the right vertebral artery. The left vertebral artery is smoothly contoured and diffusely patent IMPRESSION: 1. Small acute infarcts in the bilateral cerebellum, right occipital cortex, and possibly right pons. 2. No flow throughout most of the right vertebral artery with distal V4 segment reconstitution and faintly flowing right PICA. The right vertebral artery was also diseased on a 2014 intracranial MRA report. 3. Small remote bilateral cerebellar infarcts suggesting this is a recurrent process. 4. Large remote left MCA branch infarct. 5. Motion degraded neck MRA. Electronically Signed   By: Monte Fantasia M.D.   On: 05/30/2020 10:33   ECHOCARDIOGRAM COMPLETE  Result Date: 05/30/2020    ECHOCARDIOGRAM REPORT   Patient Name:   Jeffery Roach Date of Exam: 05/30/2020 Medical Rec #:  [id number]        Height:       72.0 in Accession #:    [id number]       Weight:       241.0 lb Date of Birth:  Jan 23, 1927         BSA:          2.306 m Patient Age:    22 years         BP:           141/107 mmHg Patient Gender: M                HR:           75 bpm. Exam Location:  Inpatient Procedure: 3D Echo, Cardiac Doppler and Color Doppler Indications:    CVA  History:        Patient has no prior history of Echocardiogram examinations and                 Patient has prior history of Echocardiogram examinations, most                 recent 03/08/2018. CHF, CAD and Previous Myocardial Infarction,                 Stroke, Arrythmias:Atrial Fibrillation; Risk                  Factors:Hypertension, Diabetes, Dyslipidemia and obesity.  Sonographer:  Dustin Flock Referring Phys: [id number] [place]  1. Left ventricular ejection fraction, by estimation, is 40 to 45%. The left ventricle has mildly decreased function. The left ventricle demonstrates global hypokinesis. The left ventricular internal cavity size was mildly dilated. Left ventricular diastolic function could not be evaluated. There is the interventricular septum is flattened in systole, consistent with right ventricular pressure overload.  2. Right ventricular systolic function was not well visualized. The right ventricular size is not well visualized. There is severely elevated pulmonary artery systolic pressure.  3. The mitral valve is abnormal. Mild mitral valve regurgitation.  4. Tricuspid valve regurgitation is mild to moderate.  5. The aortic valve is tricuspid. There is moderate calcification of the aortic valve. There is moderate thickening of the aortic valve. Aortic valve regurgitation is moderate.  6. The inferior vena cava is dilated in size with <50% respiratory variability, suggesting right atrial pressure of 15 mmHg. Comparison(s): Prior images unable to be directly viewed, comparison made by report only. Changes from prior study are noted. Echo [facility] 03/08/2018: EF 60-65%, normal RV, no significant valve disease. Conclusion(s)/Recommendation(s): No intracardiac source of embolism detected on this transthoracic study. A transesophageal echocardiogram is recommended to exclude cardiac source of embolism if clinically indicated. Very difficult windows for imaging. Recommend echo contrast for future studies. Irregular rhythm throughout. Appears globally hypokinetic, change from reported normal EF. Findings reported to Dr. Philipp Ovens and her team via Epic. FINDINGS  Left Ventricle: Left ventricular ejection fraction, by estimation, is 40 to 45%. The left ventricle has mildly decreased function. The  left ventricle demonstrates global hypokinesis. The left ventricular internal cavity size was mildly dilated. There is  borderline left ventricular hypertrophy. The interventricular septum is flattened in systole, consistent with right ventricular pressure overload. Left ventricular diastolic function could not be evaluated due to atrial fibrillation. Left ventricular diastolic function could not be evaluated. Right Ventricle: The right ventricular size is not well visualized. Right vetricular wall thickness was not well visualized. Right ventricular systolic function was not well visualized. There is severely elevated pulmonary artery systolic pressure. The tricuspid regurgitant velocity is 3.53 m/s, and with an assumed right atrial pressure of 15 mmHg, the estimated right ventricular systolic pressure is 46.6 mmHg. Left Atrium: Left atrial size was not well visualized. Right Atrium: Right atrial size was not well visualized. Pericardium: There is no evidence of pericardial effusion. Mitral Valve: The mitral valve is abnormal. Mild mitral valve regurgitation. Tricuspid Valve: The tricuspid valve is normal in structure. Tricuspid valve regurgitation is mild to moderate. Aortic Valve: Restricted movement of noncoronary cusp. The aortic valve is tricuspid. There is moderate calcification of the aortic valve. There is moderate thickening of the aortic valve. Aortic valve regurgitation is moderate. Pulmonic Valve: The pulmonic valve was not well visualized. Pulmonic valve regurgitation is trivial. Aorta: The aortic arch was not well visualized. Venous: The inferior vena cava is dilated in size with less than 50% respiratory variability, suggesting right atrial pressure of 15 mmHg. IAS/Shunts: The interatrial septum was not well visualized.  LEFT VENTRICLE PLAX 2D LVIDd:         5.60 cm  Diastology LVIDs:         4.30 cm  LV e' medial:    5.87 cm/s LV PW:         1.10 cm  LV E/e' medial:  14.3 LV IVS:        1.10 cm  LV  e' lateral:   8.70 cm/s LVOT  diam:     2.20 cm  LV E/e' lateral: 9.7 LV SV:         51 LV SV Index:   22 LVOT Area:     3.80 cm  RIGHT VENTRICLE RV Basal diam:  3.10 cm RV S prime:     6.20 cm/s LEFT ATRIUM             Index       RIGHT ATRIUM           Index LA diam:        4.50 cm 1.95 cm/m  RA Area:     16.50 cm LA Vol (A2C):   49.1 ml 21.29 ml/m RA Volume:   42.90 ml  18.60 ml/m LA Vol (A4C):   54.1 ml 23.46 ml/m LA Biplane Vol: 51.7 ml 22.42 ml/m  AORTIC VALVE LVOT Vmax:   77.00 cm/s LVOT Vmean:  39.300 cm/s LVOT VTI:    0.135 m  AORTA Ao Root diam: 3.10 cm MITRAL VALVE               TRICUSPID VALVE MV Area (PHT): 3.99 cm    TR Peak grad:   49.8 mmHg MV Decel Time: 190 msec    TR Vmax:        353.00 cm/s MV E velocity: 84.00 cm/s MV A velocity: 34.30 cm/s  SHUNTS MV E/A ratio:  2.45        Systemic VTI:  0.14 m                            Systemic Diam: 2.20 cm Buford Dresser MD Electronically signed by Buford Dresser MD Signature Date/Time: 05/30/2020/5:52:47 PM    Final    CT HEAD CODE STROKE WO CONTRAST  Result Date: 05/30/2020 CLINICAL DATA:  Code stroke. EXAM: CT HEAD WITHOUT CONTRAST TECHNIQUE: Contiguous axial images were obtained from the base of the skull through the vertex without intravenous contrast. COMPARISON:  None available FINDINGS: Brain: Small bilateral cerebellar infarcts not described on a 2020 brain MRI but likely chronic given discrete appearance. Large remote left MCA territory infarct. Generalized brain atrophy. No acute hemorrhage or hydrocephalus Vascular: No hyperdense vessel or unexpected calcification. Skull: Normal. Negative for fracture or focal lesion. Sinuses/Orbits: No acute finding. Other: These results were communicated to Dr. Leonel Ramsay at 7:38 amon 5/29/2022by text page via the [facility] messaging system. ASPECTS [facility] Stroke Program Early CT Score) Not scored without localizing symptoms IMPRESSION: 1. Large remote left MCA branch infarct. 2. Small  bilateral cerebellar infarcts not described on a 2020 report, but favored chronic. Electronically Signed   By: Monte Fantasia M.D.   On: 05/30/2020 07:42    Review of Systems  Unable to perform ROS: Acuity of condition   Blood pressure 140/70, pulse 85, temperature 97.6 F (36.4 C), temperature source Oral, resp. rate 18, height 6' (1.829 m), weight 104.6 kg, SpO2 97 %. Physical Exam Constitutional:      Appearance: He is normal weight.  HENT:     Head: Normocephalic and atraumatic.  Eyes:     Conjunctiva/sclera: Conjunctivae normal.  Neck:     Vascular: No carotid bruit.  Cardiovascular:     Comments: Irregularly irregular X9-K2 soft 2/6 systolic and diastolic murmur noted Pulmonary:     Effort: Pulmonary effort is normal.     Breath sounds: Normal breath sounds. No wheezing, rhonchi or rales.  Abdominal:     General: Abdomen  is flat. Bowel sounds are normal. There is no distension.     Palpations: Abdomen is soft.     Tenderness: There is no abdominal tenderness.  Musculoskeletal:        General: No swelling, tenderness or deformity.     Cervical back: No rigidity or tenderness.  Skin:    General: Skin is warm and dry.     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 Paroxysmal nonsustained VT Coronary artery disease history of MI in the past status post CABG Ischemic cardiomyopathy with mildly depressed LV systolic function Hypertension Diabetes mellitus Chronic kidney disease stage III Anemia of chronic disease Valvular heart disease with pulmonary hypertension GERD History of rectal and prostate cancer in the past Plan Check electrolytes magnesium and lipid panel Start metoprolol tartrate 12.5 mg twice daily hold if heart rate below 55 Start Entresto 24/26 mg 1 tablet twice daily  hold if blood pressure less than 626 systolic in view of depressed LV systolic function. Will not restart Cardizem in view of depressed LV systolic function and bradycardia Uptitrate beta-blockers as blood pressure and heart rate tolerates, if further bradycardia will switch metoprolol tartrate to carvedilol. Agree with chronic anticoagulation and with aspirin in view of significant CAD Charolette Forward 05/31/2020, 1:59 PM

## 2020-05-31 NOTE — Evaluation (Signed)
Clinical/Bedside Swallow Evaluation Patient Details  Name: Jeffery Roach MRN: [id number] Date of Birth: Jun 11, 1927  Today's Date: 05/31/2020 Time: SLP Start Time (ACUTE ONLY): 1055 SLP Stop Time (ACUTE ONLY): 1115 SLP Time Calculation (min) (ACUTE ONLY): 20 min  Past Medical History:  Past Medical History:  Diagnosis Date  . Acute on chronic systolic (congestive) heart failure ([place])   . Anemia due to stage 3 chronic kidney disease ([place])   . Arteriosclerotic coronary artery disease   . Atrial fibrillation ([place])   . Benign hypertension with chronic kidney disease, stage III ([place])   . Cerebral artery occlusion with cerebral infarction ([place])   . Cortical age-related cataract of both eyes   . Dermatochalasis of both upper eyelids   . Diabetes mellitus with stage 3 chronic kidney disease ([place])   . Gastroesophageal reflux disease without esophagitis   . History of heart attack   . Hypercalcemia   . Hyperlipidemia   . Hyperopia with astigmatism and presbyopia, bilateral   . Hypertension with goal to be determined   . Hyperthyroidism   . Hypokalemia   . LV dysfunction   . Malignant neoplasm of prostate ([place])   . Metabolic bone disease   . Myocardial infarction ([place])   . Nuclear sclerotic cataract of both eyes   . Personal history of rectal cancer   . Toxic multinodular goiter   . Toxic multinodular goiter   . Transient ischemic attack   . Type 2 diabetes mellitus with hyperglycemia ([place])   . Vitamin D deficiency   . Vitreous floaters of both eyes    Past Surgical History:  Past Surgical History:  Procedure Laterality Date  . CARDIAC SURGERY    . COLOPROCTECTOMY    . CORONARY ARTERY BYPASS GRAFT    . GASTROSTOMY TUBE PLACEMENT    . INGUINAL HERNIA REPAIR    . TONSILLECTOMY     HPI:  Pt is a 87 y/o male admitted 05/30/20 with slurred speech. MRI reveals small acute infarct in bilateral cerebellum, R occipital cortex, and R pon; small remote Bil cerebellar infarcts, L remote L  MCA branch infarct. MRA revealed no flow through R vertebral artery. PMH includes: CVA with residual R sided deficits and chronic expressive aphasia, afib on xarelto, rectal/prostate cancer, CKD< CAD s/p CABG 2005, CHF, HTN, DM 2.   Assessment / Plan / Recommendation Clinical Impression  Patient presents with a mild oropharyngeal dysphagia resulting in suspected swallow initiation delays for all boluses (thin liquids, puree solids, regular solids. He exhibited an intermittently congested sounding voice which cleared with throat clear. No other overt s/s aspiration or penetration. Patient exhibited mild right sided anterior spillage mainly with regular textures and mastication delays. SLP is recommending initiate Dys 2, thin liquids diet at this time with plans to monitor toleration and determine readiness to upgrade solid textures. SLP Visit Diagnosis: Dysphagia, unspecified (R13.10)    Aspiration Risk  Mild aspiration risk    Diet Recommendation Dysphagia 2 (Fine chop);Thin liquid   Liquid Administration via: Cup;Straw Medication Administration: Whole meds with puree Supervision: Patient able to self feed;Intermittent supervision to cue for compensatory strategies Compensations: Minimize environmental distractions;Slow rate;Small sips/bites Postural Changes: Seated upright at 90 degrees    Other  Recommendations Oral Care Recommendations: Oral care BID;Patient independent with oral care;Other (Comment) (Patient to perform oral and denture care after setup of supplies and help opening toothpaste)   Follow up Recommendations Inpatient Rehab      Frequency and Duration min 1 x/week  1  week       Prognosis Prognosis for Safe Diet Advancement: Good      Swallow Study   General Date of Onset: 05/31/20 HPI: Pt is a 87 y/o male admitted 05/30/20 with slurred speech. MRI reveals small acute infarct in bilateral cerebellum, R occipital cortex, and R pon; small remote Bil cerebellar infarcts,  L remote L MCA branch infarct. MRA revealed no flow through R vertebral artery. PMH includes: CVA with residual R sided deficits and chronic expressive aphasia, afib on xarelto, rectal/prostate cancer, CKD< CAD s/p CABG 2005, CHF, HTN, DM 2. Type of Study: Bedside Swallow Evaluation Previous Swallow Assessment: None found, however when he had previous CVA, was at [facility] per son report Diet Prior to this Study: NPO Temperature Spikes Noted: No Respiratory Status: Room air History of Recent Intubation: No Behavior/Cognition: Alert;Pleasant mood;Cooperative Oral Cavity Assessment: Within Functional Limits Oral Cavity - Dentition: Missing dentition;Dentures, top Vision: Functional for self-feeding Self-Feeding Abilities: Needs set up Patient Positioning: Upright in chair Baseline Vocal Quality: Normal Volitional Cough: Strong Volitional Swallow: Able to elicit    Oral/Motor/Sensory Function Overall Oral Motor/Sensory Function: Moderate impairment Facial ROM: Reduced right Facial Symmetry: Abnormal symmetry right Facial Strength: Reduced right Lingual ROM: Reduced right;Reduced left Lingual Strength: Reduced Velum: Within Functional Limits Mandible: Within Functional Limits   Ice Chips     Thin Liquid Thin Liquid: Impaired Presentation: Straw;Self Fed Pharyngeal  Phase Impairments: Suspected delayed Swallow    Nectar Thick     Honey Thick     Puree Puree: Impaired Presentation: Spoon;Self Fed Pharyngeal Phase Impairments: Suspected delayed Swallow   Solid     Solid: Impaired Presentation: Self Fed Oral Phase Impairments: Impaired mastication Oral Phase Functional Implications: Right anterior spillage Pharyngeal Phase Impairments: Suspected delayed Swallow     Sonia Baller, MA, CCC-SLP Speech Therapy

## 2020-05-31 NOTE — Progress Notes (Signed)
Inconsistencies in charting regarding pt's swallow screen, there is not documentation in flowsheets stating pt failed swallow screen, only note by RN stating pt failed. Cognitive SLP evaluation added, will add correct SPL order.

## 2020-05-31 NOTE — Evaluation (Signed)
Physical Therapy Evaluation Patient Details Name: Jeffery Roach MRN: [id number] DOB: 1927-11-29 Today's Date: 05/31/2020   History of Present Illness  Pt is a 87 y/o male admitted 05/30/20 with slurred speech. MRI reveals small acute infarct in bilateral cerebellum, R occipital cortex, and R pon; small remote Bil cerebellar infarcts, L remote L MCA branch infarct. MRA revealed no flow through R vertebral artery. PMH includes: CVA with residual R sided deficits and chronic expressive aphasia, afib on xarelto, rectal/prostate cancer, CKD< CAD s/p CABG 2005, CHF, HTN, DM 2.    Clinical Impression  Pt admitted with above diagnosis. Pt currently with functional limitations due to the deficits listed below (see PT Problem List). At the time of PT eval pt was able to perform transfers and ambulation with up to min assist and HHA for balance support and safety. Despite residual R side deficits and expressive aphasia from prior stroke, pt reports he is independent at home, does not use an AD or require assist with basic ADL's. It appears this patient has had a decline in function, and states his speech is worse. Question vision changes and new double vision as communication deficits made it hard to understand exactly what he was trying to say. Feel he would strongly benefit from continued multidisciplinary rehab at the CIR level to maximize functional independence and return to PLOF. Pt will benefit from skilled PT to increase their independence and safety with mobility to allow discharge to the venue listed below.       Follow Up Recommendations CIR    Equipment Recommendations  None recommended by PT    Recommendations for Other Services Rehab consult     Precautions / Restrictions Precautions Precautions: Fall Precaution Comments: Residual R side deficits from prior stroke Restrictions Weight Bearing Restrictions: No      Mobility  Bed Mobility Overal bed mobility: Needs Assistance Bed  Mobility: Supine to Sit     Supine to sit: Min assist     General bed mobility comments: Min assist due to posterior lean. Pt initiated trunk flexion and was able to scoot out fully to EOB with increased time.    Transfers Overall transfer level: Needs assistance Equipment used: None Transfers: Sit to/from Stand Sit to Stand: Min guard         General transfer comment: Hands-on guarding for safety as pt powered up to full stand. No overt LOB noted.  Ambulation/Gait Ambulation/Gait assistance: Min guard;Min assist Gait Distance (Feet): 175 Feet Assistive device: 1 person hand held assist;None Gait Pattern/deviations: Step-through pattern;Decreased stride length;Antalgic;Trunk flexed Gait velocity: Decreased Gait velocity interpretation: <1.8 ft/sec, indicate of risk for recurrent falls General Gait Details: Initially without AD. Pt mildly unsteady requiring intermittent assist to steady. With HHA, pt improved.  Stairs            Wheelchair Mobility    Modified Rankin (Stroke Patients Only) Modified Rankin (Stroke Patients Only) Pre-Morbid Rankin Score: Moderate disability Modified Rankin: Moderately severe disability     Balance Overall balance assessment: Needs assistance Sitting-balance support: Feet supported;No upper extremity supported Sitting balance-Leahy Scale: Poor Sitting balance - Comments: Approaching fair. Posterior lean noted with LE MMT. Grabbing onto mattress/sheets to pull forward.   Standing balance support: No upper extremity supported;During functional activity Standing balance-Leahy Scale: Fair Standing balance comment: statically                             Pertinent Vitals/Pain Pain Assessment: No/denies  pain    Home Living Family/patient expects to be discharged to:: Private residence Living Arrangements: Spouse/significant other Available Help at Discharge: Family;Available 24 hours/day Type of Home: House Home  Access: Stairs to enter   [employer] of Steps: 2 Home Layout: One level Home Equipment: Cane - single point      Prior Function Level of Independence: Independent         Comments: independent with ADLs, mobility; spouse completes IADLs and med mgmt     Hand Dominance   Dominant Hand: Left (reports was Right handed but switched since CVA)    Extremity/Trunk Assessment   Upper Extremity Assessment Upper Extremity Assessment: Defer to OT evaluation    Lower Extremity Assessment Lower Extremity Assessment: RLE deficits/detail RLE Deficits / Details: Bilaterally, 5/5 strength in quads, hamstrings, and ankle dorsiflexors. 4+/5 strength in B hip flexors. RLE Sensation: decreased light touch    Cervical / Trunk Assessment Cervical / Trunk Assessment: Other exceptions Cervical / Trunk Exceptions: Mild forward head posture with rounded shoulders.  Communication   Communication: Expressive difficulties  Cognition Arousal/Alertness: Awake/alert Behavior During Therapy: WFL for tasks assessed/performed Overall Cognitive Status: Impaired/Different from baseline Area of Impairment: Problem solving                             Problem Solving: Slow processing;Requires verbal cues        General Comments      Exercises     Assessment/Plan    PT Assessment Patient needs continued PT services  PT Problem List Decreased strength;Decreased activity tolerance;Decreased balance;Decreased mobility;Decreased knowledge of use of DME;Decreased safety awareness;Decreased knowledge of precautions       PT Treatment Interventions DME instruction;Gait training;Stair training;Functional mobility training;Therapeutic activities;Therapeutic exercise;Neuromuscular re-education;Patient/family education    PT Goals (Current goals can be found in the Care Plan section)  Acute Rehab PT Goals Patient Stated Goal: Return home PT Goal Formulation: With patient Time For  Goal Achievement: 06/14/20 Potential to Achieve Goals: Good    Frequency Min 4X/week   Barriers to discharge        Co-evaluation               AM-PAC PT "6 Clicks" Mobility  Outcome Measure Help needed turning from your back to your side while in a flat bed without using bedrails?: A Little Help needed moving from lying on your back to sitting on the side of a flat bed without using bedrails?: A Little Help needed moving to and from a bed to a chair (including a wheelchair)?: A Little Help needed standing up from a chair using your arms (e.g., wheelchair or bedside chair)?: A Little Help needed to walk in hospital room?: A Little Help needed climbing 3-5 steps with a railing? : A Little 6 Click Score: 18    End of Session Equipment Utilized During Treatment: Gait belt Activity Tolerance: Patient tolerated treatment well Patient left: in chair;with call bell/phone within reach;with chair alarm set Nurse Communication: Mobility status PT Visit Diagnosis: Unsteadiness on feet (R26.81);Difficulty in walking, not elsewhere classified (R26.2);Other symptoms and signs involving the nervous system (R29.898)    Time: 4010-2725 PT Time Calculation (min) (ACUTE ONLY): 38 min   Charges:   PT Evaluation $PT Eval Moderate Complexity: 1 Mod PT Treatments $Gait Training: 8-22 mins        Rolinda Roan, PT, DPT Acute Rehabilitation Services Pager: [phone] Office: [phone]   Thelma Comp 05/31/2020, 11:12  AM

## 2020-05-31 NOTE — Progress Notes (Addendum)
STROKE TEAM PROGRESS NOTE   INTERVAL HISTORY 87 y.o. male with a history of previous stroke, CAD s/p CABG 2005, MI, DM, CKD, atrial fibrillation on Xarelto who presents with difficulty getting up from a chair.  He fell asleep in a chair watching TV, and when his wife got up to tell him to get up he had difficulty standing up from the chair.  His speech at that time was more slurred than normal.  EMS was called.    Vitals:   05/31/20 0729 05/31/20 1139 05/31/20 1236 05/31/20 1539  BP: 134/62 (!) 143/73 140/70 (!) 154/122  Pulse: (!) 59 (!) 40 85   Resp: 18 18 18    Temp: 97.9 F (36.6 C) 98.6 F (37 C) 97.6 F (36.4 C)   TempSrc: Oral Oral Oral   SpO2: 94% 98% 97%   Weight:      Height:       CBC:  Recent Labs  Lab 05/30/20 0729 05/31/20 0056  WBC 11.3* 11.9*  NEUTROABS 6.0 8.1*  HGB 14.3 15.7  HCT 46.0 49.9  MCV 90.6 89.1  PLT 190 174   Basic Metabolic Panel:  Recent Labs  Lab 05/30/20 0729 05/31/20 0056  NA 137 136  K 4.5 5.0  CL 105 105  CO2 25 23  GLUCOSE 133* 125*  BUN 24* 24*  CREATININE 1.77* 1.60*  CALCIUM 8.8* 9.1   Lipid Panel: No results for input(s): CHOL, TRIG, HDL, CHOLHDL, VLDL, LDLCALC in the last 168 hours. HgbA1c: No results for input(s): HGBA1C in the last 168 hours. Urine Drug Screen: No results for input(s): LABOPIA, COCAINSCRNUR, LABBENZ, AMPHETMU, THCU, LABBARB in the last 168 hours.  Alcohol Level No results for input(s): ETH in the last 168 hours.  IMAGING past 24 hours No results found.   Echo Result read: 05/30/2020 IMPRESSIONS  1. Left ventricular ejection fraction, by estimation, is 40 to 45%. The  left ventricle has mildly decreased function. The left ventricle  demonstrates global hypokinesis. The left ventricular internal cavity size  was mildly dilated. Left ventricular  diastolic function could not be evaluated. There is the interventricular  septum is flattened in systole, consistent with right ventricular pressure   overload.  2. Right ventricular systolic function was not well visualized. The right  ventricular size is not well visualized. There is severely elevated  pulmonary artery systolic pressure.  3. The mitral valve is abnormal. Mild mitral valve regurgitation.  4. Tricuspid valve regurgitation is mild to moderate.  5. The aortic valve is tricuspid. There is moderate calcification of the  aortic valve. There is moderate thickening of the aortic valve. Aortic  valve regurgitation is moderate.  6. The inferior vena cava is dilated in size with <50% respiratory  variability, suggesting right atrial pressure of 15 mmHg.   CT head: Result read: 05/30/2020 IMPRESSION: 1. Large remote left MCA branch infarct. 2. Small bilateral cerebellar infarcts not described on a 2020 report, but favored chronic.  MRI brain Result read: 05/30/2020 IMPRESSION: 1. Small acute infarcts in the bilateral cerebellum, right occipital cortex, and possibly right pons. 2. No flow throughout most of the right vertebral artery with distal V4 segment reconstitution and faintly flowing right PICA. The right vertebral artery was also diseased on a 2014 intracranial MRA report. 3. Small remote bilateral cerebellar infarcts suggesting this is a recurrent process. 4. Large remote left MCA branch infarct. 5. Motion degraded neck MRA.  PHYSICAL EXAM  General: Sitting up in chair in no acute distress CV:  Irregular rate, rhythm. No murmurs, gallops, rubs. Pulm: Normal work of breathing on room air.  Neuro - awake, alert, eyes open, orientated to age, place, time and people. No aphasia, however, severe chronic dysarthria difficult to understand, following all simple commands. Able to name and repeat. No gaze palsy, tracking bilaterally, visual field full, PERRL. Chronic right facial droop. Tongue protrusion to the left. LUE 5/5, RUE 3/5 proximal and 2/5 distally, BLEs proximal 5-/5 and distally LLE 5/5 and RLE 4/5,  sensation decreased on the right face, arm and leg. Left FTN and HTS grossly intact, however, right FTN ataxia due to weakness, however right HTS ataxia out of proportion to the weakness, gait not tested.   ASSESSMENT/PLAN [name] is a 87 y.o. male with history of prior CVA with residual right-sided deficits, chronic atrial fibrillation on Xarelto, rectal cancer, prostate cancer, CAD s/p CABG 2005, CKDIIIb, chronic diastolic heart failure (EF 60-65% in 03/2018), hypertension, type 2 diabetes mellitus who presented with difficluty standing and slurred speech.    Stroke:  right small acute infarcts in the bilateral cerebellum, right occipital cortex likely due to cardioembolic source in the setting of chronic atrial fibrillation  Code Stroke CT head: large remote left MCA branch infarct  MRI: small acute infarcts in the bilateral cerebellum, right occipital cortex, and possibly right pons.  MRA head/neck: No flow throughout most of the right vertebral artery with distal V4 segment reconstitution and faintly flowing right PICA.   2D Echo EF: 40-45%, left ventricle demonstrates global hypokinesis  Sars coronavirus: pending  LDL pending  HgbA1c pending  VTE prophylaxis - scds Diet: Dysphagia 2  aspirin 81 mg daily and Xarelto (rivaroxaban) daily prior to admission, now on aspirin 81 mg daily and Xarelto (rivaroxaban) daily. Given Xarelto failure, will switch to eliquis.   Therapy recommendations:  CIR  Disposition:  pending  Hypertension  Home meds:  Amlodipine 5mg  daily  Stable . SBP <180 . Long-term BP goal normotensive  Chronic Atrial Fibrillation  On diltiazem 120mg  daily and Toprol XL 25mg  daily for rate control at home  Xarelto 20mg  daily  Cardiology on board  metoprlol tartrate 12.5mg  bid  Holding diltiazem in the setting of depressed LV systolic function and bradycardia  Hyperlipidemia  Home meds:  Atorvastatin 10mg  daily, resumed in hospital  LDL  pending, goal < 70  Continue statin at discharge  Diabetes type II Controlled  Home meds:  Glipizide 10mg  with breakfast  HgbA1c pending, goal < 7.0  CBGs  SSI  Other Stroke Risk Factors  Advanced Age >/= 46   Cigarette smoker, former  Obesity, Body mass index is 31.28 kg/m., BMI >/= 30 associated with increased stroke risk, recommend weight loss, diet and exercise as appropriate   Hx stroke: left MCA infarct with right-sided residual weakness  Family hx stroke (sister)  Coronary artery disease  Congestive heart failure  Other Active Problems  Stage 3 CKD  Hyperthyroidism  Rectal cancer  Toxic multinodular goiter  Malignant neoplasm of prostate  GERD  Hospital day # 0  Jeffery Roach, ACNP-BC Stroke NP  ATTENDING NOTE: I reviewed above note and agree with the assessment and plan. Pt was seen and examined.   87 year old male with history of A. fib on Xarelto, left MCA stroke in 2015 with residual of dysarthria and right arm weakness admitted for whole body weakness and not able to stand.  CT head showed old bilateral cerebellar and left MCA infarct.  MRI showed bilateral cerebellum, right pontine and  right occipital scattered small infarcts.  MRA head and neck showed left VA occlusion with V4 retrograde flow.  EF 40 to 45%.  A1c 8.0, LDL 47.  On exam, patient awake, alert, eyes open, orientated to age, place, time and people. No aphasia, however, severe chronic dysarthria difficult to understand, following all simple commands. Able to name and repeat. No gaze palsy, tracking bilaterally, visual field full, PERRL. Chronic right facial droop. Tongue protrusion to the left. LUE 5/5, RUE 3/5 proximal and 2/5 distally, BLEs proximal 5-/5 and distally LLE 5/5 and RLE 4/5, sensation decreased on the right face, arm and leg. Left FTN and HTS grossly intact, however, right FTN ataxia due to weakness, however right HTS ataxia out of proportion to the weakness,  gait not tested.   Etiology for patient stroke likely due to chronic atrial fibrillation.  Wife stated patient compliant with Xarelto, therefore, patient likely have Xarelto failure.  Recommend switch to Eliquis for further stroke prevention.  Okay to continue home aspirin 81 and Lipitor 10.  PT/OT recommend CIR.  For detailed assessment and plan, please refer to above as I have made changes wherever appropriate.   Neurology will sign off. Please call with questions. Pt will follow up with stroke clinic NP at [facility] in about 4 weeks. Thanks for the consult.   Rosalin Hawking, MD PhD Stroke Neurology 05/31/2020 5:56 PM     To contact Stroke Continuity provider, please refer to [email or web address] After hours, contact General Neurology

## 2020-05-31 NOTE — Plan of Care (Signed)

## 2020-05-31 NOTE — Progress Notes (Signed)
Pt ff floor for VQ scan, monitor on patient.

## 2020-05-31 NOTE — Consult Note (Signed)
Physical Medicine and Rehabilitation Consult  Reason for Consult:Stroke with functional decline.  Referring Physician: Dr. Philipp Ovens.    HPI: Jeffery Roach is a 87 y.o. 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 He was found to have small acute infarct in bilateral cerebellum, right occipital cortex and possibly right pons with no flow throughout most of R-VA with distal V4 segment reconstitution and remover large L-MCA infarct.  2 D echo showed EF 40-45% with global hypokinesis and severely elevated PAH. Stroke felt to embolic in setting of CAF and he continues on low dose ASA/Xarelto.  Dr. Terrence Dupont consulted for input on A fib with RVR and occasional NSVT. He recommended addition of metoprolol for rate control as well as Entresto for depressed cardiac function. Therapy evaluations completed revealing severe dysarthria with mild oropharyngeal dysphagia--started on DII, thin liquids due to mild aspiration risk, balance deficits and diplopia affecting functional mobility and ADLs. CIR was recommended due to functional decline.     Pt reports having severe dysarthria- very hard to understand him.  Denies pain; LBM via ostomy this AM- got ostomy changed this AM.  Peeing OK.  Coughing occ- pt says feels like something goes down the wrong way.    Review of Systems  Constitutional: Negative.   HENT: Positive for hearing loss.   Eyes: Negative.  Negative for pain and discharge.  Respiratory: Positive for cough. Negative for sputum production, shortness of breath and wheezing.   Cardiovascular: Negative.  Negative for chest pain and leg swelling.  Gastrointestinal: Negative for constipation, diarrhea and heartburn.  Genitourinary: Negative for dysuria, frequency and urgency.  Musculoskeletal: Negative.   Skin: Negative.    Neurological: Positive for speech change, focal weakness and weakness. Negative for headaches.  Endo/Heme/Allergies: Negative.   Psychiatric/Behavioral: Negative.   All other systems reviewed and are negative.     Past Medical History:  Diagnosis Date  . Acute on chronic systolic (congestive) heart failure ([place])   . Anemia due to stage 3 chronic kidney disease ([place])   . Arteriosclerotic coronary artery disease   . Atrial fibrillation ([place])   . Benign hypertension with chronic kidney disease, stage III ([place])   . Cerebral artery occlusion with cerebral infarction ([place])   . Cortical age-related cataract of both eyes   . Dermatochalasis of both upper eyelids   . Diabetes mellitus with stage 3 chronic kidney disease ([place])   . Gastroesophageal reflux disease without esophagitis   . History of heart attack   . Hypercalcemia   . Hyperlipidemia   . Hyperopia with astigmatism and presbyopia, bilateral   . Hypertension with goal to be determined   . Hyperthyroidism   . Hypokalemia   . LV dysfunction   . Malignant neoplasm of prostate ([place])   . Metabolic bone disease   . Myocardial infarction ([place])   . Nuclear sclerotic cataract of both eyes   . Personal history of rectal cancer   . Toxic multinodular goiter   . Toxic multinodular goiter   . Transient ischemic attack   . Type 2 diabetes mellitus with hyperglycemia ([place])   . Vitamin D deficiency   . Vitreous floaters of both eyes    Past Surgical History:  Procedure Laterality Date  . CARDIAC SURGERY    . COLOPROCTECTOMY    . CORONARY ARTERY BYPASS GRAFT    .  GASTROSTOMY TUBE PLACEMENT    . INGUINAL HERNIA REPAIR    . TONSILLECTOMY     Family History  Problem Relation Age of Onset  . Diabetes Sister   . Stroke Sister   . Hypertension Sister    Social History:  reports that he has quit smoking. He has never used smokeless tobacco. No history on file for alcohol use and drug use. Allergies: No Known Allergies Medications  Prior to Admission  Medication Sig Dispense Refill  . amLODipine (NORVASC) 5 MG tablet Take 5 mg by mouth daily after breakfast.    . aspirin EC 81 MG tablet Take 81 mg by mouth daily after breakfast.    . atorvastatin (LIPITOR) 10 MG tablet Take 1 tablet (10 mg total) by mouth daily. (Patient taking differently: Take 10 mg by mouth daily after breakfast.) 30 tablet 0  . cholecalciferol (VITAMIN D) 25 MCG (1000 UT) tablet Take 1,000 Units by mouth daily after breakfast.    . dapagliflozin propanediol (FARXIGA) 10 MG TABS tablet Take 10 mg by mouth daily before breakfast.    . ferrous sulfate 325 (65 FE) MG tablet Take 1 tablet (325 mg total) by mouth daily with breakfast. (Patient taking differently: Take 325 mg by mouth daily after breakfast.) 30 tablet 0  . furosemide (LASIX) 40 MG tablet Take 40 mg by mouth daily after breakfast.    . glipiZIDE (GLUCOTROL XL) 10 MG 24 hr tablet Take 1 tablet (10 mg total) by mouth daily with breakfast. (Patient taking differently: Take 10 mg by mouth daily before breakfast.) 30 tablet 0  . pantoprazole (PROTONIX) 40 MG tablet Take 40 mg by mouth daily after breakfast.    . potassium chloride (KLOR-CON) 10 MEQ tablet Take 10 mEq by mouth daily before supper.    . rivaroxaban (XARELTO) 20 MG TABS tablet Take 1 tablet (20 mg total) by mouth daily with supper. (Patient taking differently: Take 20 mg by mouth daily after breakfast.) 30 tablet 0  . diltiazem (CARDIZEM) 120 MG tablet Take 1 tablet (120 mg total) by mouth daily. (Patient not taking: No sig reported) 30 tablet 0  . insulin lispro (HUMALOG) 100 UNIT/ML injection Inject 0.03-0.15 mLs (3-15 Units total) into the skin 3 (three) times daily with meals. SSI: 151-200: 3 units; 201-250: 5 units; 251-300: 8 units; 301-350: 11 units; 351-400: 15 units (Patient not taking: No sig reported) 10 mL 0  . metoprolol succinate (TOPROL-XL) 25 MG 24 hr tablet Take 1 tablet (25 mg total) by mouth daily. (Patient not taking: No  sig reported) 30 tablet 0    Home: Home Living Family/patient expects to be discharged to:: Private residence Living Arrangements: Spouse/significant other Available Help at Discharge: Family,Available 24 hours/day Type of Home: House Home Access: Stairs to enter [employer] of Steps: 2 Home Layout: One level Bathroom Shower/Tub: Multimedia programmer: [place]: [place] - single point  Functional History: Prior Function Level of Independence: Independent Comments: independent with ADLs, mobility; spouse completes IADLs and med mgmt Functional Status:  Mobility: Bed Mobility Overal bed mobility: Needs Assistance Bed Mobility: Supine to Sit Supine to sit: Min assist General bed mobility comments: Min assist due to posterior lean. Pt initiated trunk flexion and was able to scoot out fully to EOB with increased time. Transfers Overall transfer level: Needs assistance Equipment used: None Transfers: Sit to/from Stand Sit to Stand: Min guard General transfer comment: Hands-on guarding for safety as pt powered up to full stand. No overt LOB  noted. Ambulation/Gait Ambulation/Gait assistance: Min guard,Min assist Gait Distance (Feet): 175 Feet Assistive device: 1 person hand held assist,None Gait Pattern/deviations: Step-through pattern,Decreased stride length,Antalgic,Trunk flexed General Gait Details: Initially without AD. Pt mildly unsteady requiring intermittent assist to steady. With HHA, pt improved. Gait velocity: Decreased Gait velocity interpretation: <1.8 ft/sec, indicate of risk for recurrent falls    ADL: ADL Overall ADL's : Needs assistance/impaired Grooming: Minimal assistance,Wash/dry hands,Wash/dry face,Standing Grooming Details (indicate cue type and reason): cueing to sequence and locate objects Upper Body Dressing : Sitting,Set up Upper Body Dressing Details (indicate cue type and reason): to down back gown Lower Body  Dressing: Minimal assistance,Sit to/from stand Lower Body Dressing Details (indicate cue type and reason): able to manage socks, min assist in standing Toilet Transfer: Minimal assistance,Ambulation Toilet Transfer Details (indicate cue type and reason): no AD, simulated Functional mobility during ADLs: Minimal assistance,Cueing for safety General ADL Comments: pt limtied by balance, activity tolerance  Cognition: Cognition Overall Cognitive Status: Difficult to assess Orientation Level: Oriented X4 Attention: Sustained Sustained Attention: Appears intact Awareness: Impaired Awareness Impairment: Emergent impairment Safety/Judgment: Appears intact Cognition Arousal/Alertness: Awake/alert Behavior During Therapy: WFL for tasks assessed/performed Overall Cognitive Status: Difficult to assess Area of Impairment: Problem solving,Awareness Awareness: Emergent Problem Solving: Slow processing,Requires verbal cues,Difficulty sequencing General Comments: slow processing and some decreased awareness (initally reports feeling normal) to deficits, but difficult to understand at times  Blood pressure (!) 129/97, pulse 73, temperature 98.7 F (37.1 C), temperature source Oral, resp. rate 20, height 6' (1.829 m), weight 104.6 kg, SpO2 96 %. Physical Exam Vitals and nursing note reviewed.  Constitutional:      Comments: Pt sitting up in bed watching TV- appropriate, appears 20-30 yrs younger than stated age of 32, NAD; wearing EG's  HENT:     Head: Normocephalic and atraumatic.     Comments: Significant R sided facial droop Tongue deviated to right Facial sensation decreased on Right side    Right Ear: External ear normal.     Left Ear: External ear normal.     Nose: Nose normal. No congestion or rhinorrhea.     Mouth/Throat:     Mouth: Mucous membranes are dry.     Pharynx: Oropharynx is clear. No oropharyngeal exudate.  Eyes:     General:        Right eye: No discharge.        Left  eye: No discharge.     Extraocular Movements: Extraocular movements intact.     Comments: No nystagmus Muddy sclerae B/L  Cardiovascular:     Comments: irregular rhythm, rate controlled Pulmonary:     Comments: CTA B/L- no W/R/R- good air movement occ cough, but nonproductive Abdominal:     Comments: Soft, NT, ND, (+)BS  ; has empty new ostomy bag with pink stoma on LLQ  Musculoskeletal:     Cervical back: Normal range of motion and neck supple.     Comments: LLUE 5/5 RUE- 4+/5 in biceps, triceps ; 4/5 in WE and grip and 2-/5 in FA LLE- 5/5 RLE- HF, KE, DF and PF all 4+/5 in RLE   Skin:    General: Skin is warm and dry.     Comments: L hand IV- looks OK A little dry skin  Neurological:     Mental Status: He is oriented to person, place, and time.     Comments: Very dysarthric Maybe mild aphasia- hard to tell with severity of dysarthria Knew was 2022- made an error- fixed  it saying 1922 initially- fixed immediately Also knew was in hospital- but sounded like word salad when tried to say so.  Decreased sensation to light touch in R side of body  Psychiatric:     Comments: Joking some; appropriate     Results for orders placed or performed during the hospital encounter of 05/30/20 (from the past 24 hour(s))  Glucose, capillary     Status: Abnormal   Collection Time: 05/31/20  3:57 PM  Result Value Ref Range   Glucose-Capillary 125 (H) 70 - 99 mg/dL  CBC     Status: None   Collection Time: 06/01/20  3:00 AM  Result Value Ref Range   WBC 9.8 4.0 - 10.5 K/uL   RBC 5.10 4.22 - 5.81 MIL/uL   Hemoglobin 14.3 13.0 - 17.0 g/dL   HCT 44.6 39.0 - 52.0 %   MCV 87.5 80.0 - 100.0 fL   MCH 28.0 26.0 - 34.0 pg   MCHC 32.1 30.0 - 36.0 g/dL   RDW 14.4 11.5 - 15.5 %   Platelets 202 150 - 400 K/uL   nRBC 0.0 0.0 - 0.2 %  Basic metabolic panel     Status: Abnormal   Collection Time: 06/01/20  3:00 AM  Result Value Ref Range   Sodium 137 135 - 145 mmol/L   Potassium 4.3 3.5 - 5.1  mmol/L   Chloride 105 98 - 111 mmol/L   CO2 22 22 - 32 mmol/L   Glucose, Bld 117 (H) 70 - 99 mg/dL   BUN 27 (H) 8 - 23 mg/dL   Creatinine, Ser 1.72 (H) 0.61 - 1.24 mg/dL   Calcium 9.0 8.9 - 10.3 mg/dL   GFR, Estimated 37 (L) >60 mL/min   Anion gap 10 5 - 15  Magnesium     Status: None   Collection Time: 06/01/20  3:00 AM  Result Value Ref Range   Magnesium 2.4 1.7 - 2.4 mg/dL  Lipid panel     Status: Abnormal   Collection Time: 06/01/20  3:00 AM  Result Value Ref Range   Cholesterol 102 0 - 200 mg/dL   Triglycerides 49 <150 mg/dL   HDL 38 (L) >40 mg/dL   Total CHOL/HDL Ratio 2.7 RATIO   VLDL 10 0 - 40 mg/dL   LDL Cholesterol 54 0 - 99 mg/dL  Glucose, capillary     Status: Abnormal   Collection Time: 06/01/20  6:33 AM  Result Value Ref Range   Glucose-Capillary 130 (H) 70 - 99 mg/dL  Glucose, capillary     Status: Abnormal   Collection Time: 06/01/20  7:58 AM  Result Value Ref Range   Glucose-Capillary 167 (H) 70 - 99 mg/dL   DG Chest 1 View  Result Date: 05/31/2020 CLINICAL DATA:  Heart failure EXAM: CHEST  1 VIEW COMPARISON:  03/07/2018 FINDINGS: Post sternotomy changes. Cardiomegaly with vascular congestion and mild diffuse interstitial opacity, likely edema. Patchy atelectasis left base. No large effusion. Aortic atherosclerosis. IMPRESSION: 1. Cardiomegaly with vascular congestion and probable mild interstitial edema. 2. Streaky atelectasis at the left lung base. Electronically Signed   By: Donavan Foil M.D.   On: 05/31/2020 19:33   NM Pulmonary Perfusion  Result Date: 05/31/2020 CLINICAL DATA:  Difficulty breathing EXAM: NUCLEAR MEDICINE PERFUSION LUNG SCAN TECHNIQUE: Perfusion images were obtained in multiple projections after intravenous injection of radiopharmaceutical. Ventilation scans intentionally deferred if perfusion scan and chest x-ray adequate for interpretation during COVID 19 epidemic. RADIOPHARMACEUTICALS:  4.4 mCi Tc-77m MAA IV COMPARISON:  Chest x-ray from  earlier in the same day. FINDINGS: Adequate uptake is noted throughout both lungs. No wedge-shaped defects are identified to suggest pulmonary embolism. IMPRESSION: No evidence of pulmonary embolism. Electronically Signed   By: Inez Catalina M.D.   On: 05/31/2020 22:31   ECHOCARDIOGRAM COMPLETE  Result Date: 05/30/2020    ECHOCARDIOGRAM REPORT   Patient Name:   Jeffery Roach Date of Exam: 05/30/2020 Medical Rec #:  [id number]        Height:       72.0 in Accession #:    [id number]       Weight:       241.0 lb Date of Birth:  05-10-1927         BSA:          2.306 m Patient Age:    64 years         BP:           141/107 mmHg Patient Gender: M                HR:           75 bpm. Exam Location:  Inpatient Procedure: 3D Echo, Cardiac Doppler and Color Doppler Indications:    CVA  History:        Patient has no prior history of Echocardiogram examinations and                 Patient has prior history of Echocardiogram examinations, most                 recent 03/08/2018. CHF, CAD and Previous Myocardial Infarction,                 Stroke, Arrythmias:Atrial Fibrillation; Risk                 Factors:Hypertension, Diabetes, Dyslipidemia and obesity.  Sonographer:    Dustin Flock Referring Phys: [id number] [place]  1. Left ventricular ejection fraction, by estimation, is 40 to 45%. The left ventricle has mildly decreased function. The left ventricle demonstrates global hypokinesis. The left ventricular internal cavity size was mildly dilated. Left ventricular diastolic function could not be evaluated. There is the interventricular septum is flattened in systole, consistent with right ventricular pressure overload.  2. Right ventricular systolic function was not well visualized. The right ventricular size is not well visualized. There is severely elevated pulmonary artery systolic pressure.  3. The mitral valve is abnormal. Mild mitral valve regurgitation.  4. Tricuspid valve regurgitation is mild to  moderate.  5. The aortic valve is tricuspid. There is moderate calcification of the aortic valve. There is moderate thickening of the aortic valve. Aortic valve regurgitation is moderate.  6. The inferior vena cava is dilated in size with <50% respiratory variability, suggesting right atrial pressure of 15 mmHg. Comparison(s): Prior images unable to be directly viewed, comparison made by report only. Changes from prior study are noted. Echo [facility] 03/08/2018: EF 60-65%, normal RV, no significant valve disease. Conclusion(s)/Recommendation(s): No intracardiac source of embolism detected on this transthoracic study. A transesophageal echocardiogram is recommended to exclude cardiac source of embolism if clinically indicated. Very difficult windows for imaging. Recommend echo contrast for future studies. Irregular rhythm throughout. Appears globally hypokinetic, change from reported normal EF. Findings reported to Dr. Philipp Ovens and her team via Epic. FINDINGS  Left Ventricle: Left ventricular ejection fraction, by estimation, is 40 to 45%. The left ventricle has mildly decreased function. The left ventricle demonstrates  global hypokinesis. The left ventricular internal cavity size was mildly dilated. There is  borderline left ventricular hypertrophy. The interventricular septum is flattened in systole, consistent with right ventricular pressure overload. Left ventricular diastolic function could not be evaluated due to atrial fibrillation. Left ventricular diastolic function could not be evaluated. Right Ventricle: The right ventricular size is not well visualized. Right vetricular wall thickness was not well visualized. Right ventricular systolic function was not well visualized. There is severely elevated pulmonary artery systolic pressure. The tricuspid regurgitant velocity is 3.53 m/s, and with an assumed right atrial pressure of 15 mmHg, the estimated right ventricular systolic pressure is 16.1 mmHg. Left Atrium: Left  atrial size was not well visualized. Right Atrium: Right atrial size was not well visualized. Pericardium: There is no evidence of pericardial effusion. Mitral Valve: The mitral valve is abnormal. Mild mitral valve regurgitation. Tricuspid Valve: The tricuspid valve is normal in structure. Tricuspid valve regurgitation is mild to moderate. Aortic Valve: Restricted movement of noncoronary cusp. The aortic valve is tricuspid. There is moderate calcification of the aortic valve. There is moderate thickening of the aortic valve. Aortic valve regurgitation is moderate. Pulmonic Valve: The pulmonic valve was not well visualized. Pulmonic valve regurgitation is trivial. Aorta: The aortic arch was not well visualized. Venous: The inferior vena cava is dilated in size with less than 50% respiratory variability, suggesting right atrial pressure of 15 mmHg. IAS/Shunts: The interatrial septum was not well visualized.  LEFT VENTRICLE PLAX 2D LVIDd:         5.60 cm  Diastology LVIDs:         4.30 cm  LV e' medial:    5.87 cm/s LV PW:         1.10 cm  LV E/e' medial:  14.3 LV IVS:        1.10 cm  LV e' lateral:   8.70 cm/s LVOT diam:     2.20 cm  LV E/e' lateral: 9.7 LV SV:         51 LV SV Index:   22 LVOT Area:     3.80 cm  RIGHT VENTRICLE RV Basal diam:  3.10 cm RV S prime:     6.20 cm/s LEFT ATRIUM             Index       RIGHT ATRIUM           Index LA diam:        4.50 cm 1.95 cm/m  RA Area:     16.50 cm LA Vol (A2C):   49.1 ml 21.29 ml/m RA Volume:   42.90 ml  18.60 ml/m LA Vol (A4C):   54.1 ml 23.46 ml/m LA Biplane Vol: 51.7 ml 22.42 ml/m  AORTIC VALVE LVOT Vmax:   77.00 cm/s LVOT Vmean:  39.300 cm/s LVOT VTI:    0.135 m  AORTA Ao Root diam: 3.10 cm MITRAL VALVE               TRICUSPID VALVE MV Area (PHT): 3.99 cm    TR Peak grad:   49.8 mmHg MV Decel Time: 190 msec    TR Vmax:        353.00 cm/s MV E velocity: 84.00 cm/s MV A velocity: 34.30 cm/s  SHUNTS MV E/A ratio:  2.45        Systemic VTI:  0.14 m  Systemic Diam: 2.20 cm Buford Dresser MD Electronically signed by Buford Dresser MD Signature Date/Time: 05/30/2020/5:52:47 PM    Final      Assessment/Plan: Diagnosis: B/L cerebellum, R occoiptal and R pons infarct and Large L mCA infarct- embolic cause- with R hemiparesis and aphasia and dysarthria and dysphagia 1. Does the need for close, 24 hr/day medical supervision in concert with the patient's rehab needs make it unreasonable for this patient to be served in a less intensive setting? Potentially 2. Co-Morbidities requiring supervision/potential complications: Afib on Xarelto, CHF, CAD s/p CABG; DM, CKD3, rectal CA with colostomy; GERD; hyperthyroidism with goiter 3. Due to bladder management, bowel management, safety, skin/wound care, disease management, medication administration, pain management and patient education, does the patient require 24 hr/day rehab nursing? Yes 4. Does the patient require coordinated care of a physician, rehab nurse, therapy disciplines of PT, OT, and SLP to address physical and functional deficits in the context of the above medical diagnosis(es)? Yes Addressing deficits in the following areas: balance, endurance, locomotion, strength, transferring, bathing, dressing, feeding, grooming, toileting, speech, language and swallowing 5. Can the patient actively participate in an intensive therapy program of at least 3 hrs of therapy per day at least 5 days per week? Yes 6. The potential for patient to make measurable gains while on inpatient rehab is excellent 7. Anticipated functional outcomes upon discharge from inpatient rehab are modified independent and supervision  with PT, modified independent and supervision with OT, modified independent and supervision with SLP. 8. Estimated rehab length of stay to reach the above functional goals is: 7-10 days 9. Anticipated discharge destination: Home 10. Overall Rehab/Functional Prognosis:  excellent  RECOMMENDATIONS: This patient's condition is appropriate for continued rehabilitative care in the following setting: CIR and HH Therapy Patient has agreed to participate in recommended program. Potentially Note that insurance prior authorization may be required for reimbursement for recommended care.  Comment:  1. Pt wants to go home- knows could benefit from inpt CIR/rehab, but was pretty emphatic doesn't want to go to CIR.  2. Asked him to discuss with his wife and then make a decision-  3. Pt could and would benefit from CIR/inpt rehab- for PT, OT and SLP based on his exam- esp SLP.  4. Will d/w admissions coordinators-  5. Thank you for this consult.   Bary Leriche, PA-C 06/01/2020

## 2020-06-01 DIAGNOSIS — I4891 Unspecified atrial fibrillation: Secondary | ICD-10-CM

## 2020-06-01 DIAGNOSIS — I639 Cerebral infarction, unspecified: Secondary | ICD-10-CM | POA: Diagnosis not present

## 2020-06-01 DIAGNOSIS — I272 Pulmonary hypertension, unspecified: Secondary | ICD-10-CM

## 2020-06-01 DIAGNOSIS — E119 Type 2 diabetes mellitus without complications: Secondary | ICD-10-CM

## 2020-06-01 DIAGNOSIS — N1832 Chronic kidney disease, stage 3b: Secondary | ICD-10-CM

## 2020-06-01 DIAGNOSIS — I5032 Chronic diastolic (congestive) heart failure: Secondary | ICD-10-CM

## 2020-06-01 DIAGNOSIS — I482 Chronic atrial fibrillation, unspecified: Secondary | ICD-10-CM

## 2020-06-01 LAB — CBC
HCT: 44.6 % (ref 39.0–52.0)
Hemoglobin: 14.3 g/dL (ref 13.0–17.0)
MCH: 28 pg (ref 26.0–34.0)
MCHC: 32.1 g/dL (ref 30.0–36.0)
MCV: 87.5 fL (ref 80.0–100.0)
Platelets: 202 10*3/uL (ref 150–400)
RBC: 5.1 MIL/uL (ref 4.22–5.81)
RDW: 14.4 % (ref 11.5–15.5)
WBC: 9.8 10*3/uL (ref 4.0–10.5)
nRBC: 0 % (ref 0.0–0.2)

## 2020-06-01 LAB — GLUCOSE, CAPILLARY
Glucose-Capillary: 130 mg/dL — ABNORMAL HIGH (ref 70–99)
Glucose-Capillary: 144 mg/dL — ABNORMAL HIGH (ref 70–99)
Glucose-Capillary: 146 mg/dL — ABNORMAL HIGH (ref 70–99)
Glucose-Capillary: 167 mg/dL — ABNORMAL HIGH (ref 70–99)
Glucose-Capillary: 171 mg/dL — ABNORMAL HIGH (ref 70–99)
Glucose-Capillary: 189 mg/dL — ABNORMAL HIGH (ref 70–99)

## 2020-06-01 LAB — LIPID PANEL
Cholesterol: 102 mg/dL (ref 0–200)
HDL: 38 mg/dL — ABNORMAL LOW (ref >40)
LDL Cholesterol: 54 mg/dL (ref 0–99)
Total CHOL/HDL Ratio: 2.7 RATIO
Triglycerides: 49 mg/dL (ref <150)
VLDL: 10 mg/dL (ref 0–40)

## 2020-06-01 LAB — BASIC METABOLIC PANEL
Anion gap: 10 (ref 5–15)
BUN: 27 mg/dL — ABNORMAL HIGH (ref 8–23)
CO2: 22 mmol/L (ref 22–32)
Calcium: 9 mg/dL (ref 8.9–10.3)
Chloride: 105 mmol/L (ref 98–111)
Creatinine, Ser: 1.72 mg/dL — ABNORMAL HIGH (ref 0.61–1.24)
GFR, Estimated: 37 mL/min — ABNORMAL LOW (ref >60)
Glucose, Bld: 117 mg/dL — ABNORMAL HIGH (ref 70–99)
Potassium: 4.3 mmol/L (ref 3.5–5.1)
Sodium: 137 mmol/L (ref 135–145)

## 2020-06-01 LAB — MAGNESIUM: Magnesium: 2.4 mg/dL (ref 1.7–2.4)

## 2020-06-01 MED ORDER — SENNOSIDES-DOCUSATE SODIUM 8.6-50 MG PO TABS
1.0000 | ORAL_TABLET | Freq: Every day | ORAL | Status: DC
  Administered 2020-06-01: 1 via ORAL
  Filled 2020-06-01: qty 1

## 2020-06-01 MED ORDER — AMIODARONE HCL 200 MG PO TABS
100.0000 mg | ORAL_TABLET | Freq: Every day | ORAL | Status: DC
  Administered 2020-06-01 – 2020-06-03 (×3): 100 mg via ORAL
  Filled 2020-06-01 (×3): qty 1

## 2020-06-01 NOTE — Progress Notes (Signed)
Pt w/ 3 beats Vtach at 1927 per telemetry and off-going RN. Pt asymptomatic and vs unremarkable. [facility] ([facility]) informed.

## 2020-06-01 NOTE — Progress Notes (Addendum)
Inpatient Rehabilitation Admissions Coordinator  I met with patient at bedside and then spoke with his wife by phone. We discussed goals and expectations of a possible CIR admit. Patient states preference for d/c directly home. Wife prefers CIR admit prior to return home with her. I have made her aware that he is not currently in agreement. She will be visiting tomorrow with their daughter at 3 pm so that we can further discuss his rehab venue options.  Danne Baxter, RN, MSN Rehab Admissions Coordinator [phone] 06/01/2020 2:12 PM

## 2020-06-01 NOTE — Consult Note (Addendum)
[place] Nurse ostomy consult note Patient receiving care in [facility] [id number] This is not a new colostomy. Patient has history of rectal cancer.  Stoma type/location: LLQ Colostomy Stomal assessment/size: 1 3/4" just above skin level, pink and moist Peristomal assessment: Intact Output: Type 1 hard separate clumps Ostomy pouching: 2pc flat 2 3/4" pouch Kellie Simmering # [phone]) Skin barrier Kellie Simmering # 2).  Education provided: None Enrolled patient in [place] program: No Pouches to be ordered by Network engineer and place in the patient room.  2 piece pouching system   Fecal pouches , [place] #[phone]              Skin Barrier, Kellie Simmering # 2      [place] Tamala Julian, MSN, RN, [place], Lysle Pearl, [facility] Wound Treatment Associate Pager [phone]

## 2020-06-01 NOTE — Progress Notes (Signed)
PT Cancellation Note  Patient Details Name: Jeffery Roach MRN: [id number] DOB: 1927/10/12   Cancelled Treatment:       Started prep for treatment but transport arrived to take pt to nuclear medicine.  Will f/u as able.  Abran Richard, PT Acute Rehab Services Pager [phone] [facility] [phone]   Karlton Lemon 06/01/2020, 11:00 AM

## 2020-06-01 NOTE — Plan of Care (Signed)

## 2020-06-01 NOTE — Progress Notes (Signed)
Physical Therapy Treatment Patient Details Name: Jeffery Roach MRN: [id number] DOB: 23-Dec-1927 Today's Date: 06/01/2020    History of Present Illness Pt is a 87 y/o male admitted 05/30/20 with slurred speech. MRI reveals small acute infarct in bilateral cerebellum, R occipital cortex, and R pon; small remote Bil cerebellar infarcts, L remote L MCA branch infarct. MRA revealed no flow through R vertebral artery. PMH includes: CVA with residual R sided deficits and chronic expressive aphasia, afib on xarelto, rectal/prostate cancer, CKD< CAD s/p CABG 2005, CHF, HTN, DM 2.    PT Comments    Pt making good progress with improved transfers and stability.  Suspect pt is approaching baseline mobility.  Continue to progress as able.    Follow Up Recommendations  CIR     Equipment Recommendations  None recommended by PT    Recommendations for Other Services       Precautions / Restrictions Precautions Precautions: Fall Precaution Comments: Residual R side deficits from prior stroke    Mobility  Bed Mobility Overal bed mobility: Needs Assistance Bed Mobility: Supine to Sit     Supine to sit: Supervision     General bed mobility comments: Supervision but pt using bed rail and with use of legs to provide momentum to lift trunk    Transfers Overall transfer level: Needs assistance Equipment used: None Transfers: Sit to/from Stand Sit to Stand: Supervision         General transfer comment: Close supervision ;performed x 3  Ambulation/Gait Ambulation/Gait assistance: Min guard Gait Distance (Feet): 200 Feet Assistive device: Rolling walker (2 wheeled);None Gait Pattern/deviations: Step-through pattern;Decreased stride length Gait velocity: Decreased   General Gait Details: Pt initially without AD and mild unsteady.  He requested to use RW - required assist occasionally to turn RW due to chronic R weakness.   Stairs             Wheelchair Mobility    Modified  Rankin (Stroke Patients Only) Modified Rankin (Stroke Patients Only) Pre-Morbid Rankin Score: Moderate disability Modified Rankin: Moderately severe disability     Balance Overall balance assessment: Needs assistance Sitting-balance support: Feet supported;No upper extremity supported Sitting balance-Leahy Scale: Good       Standing balance-Leahy Scale: Fair                              Cognition Arousal/Alertness: Awake/alert Behavior During Therapy: WFL for tasks assessed/performed Overall Cognitive Status: Difficult to assess                                 General Comments: Difficult to understand due to chronic aphasia but following all commands      Exercises      General Comments General comments (skin integrity, edema, etc.): HR 80's with walking      Pertinent Vitals/Pain Pain Assessment: No/denies pain Faces Pain Scale: No hurt    Home Living                      Prior Function            PT Goals (current goals can now be found in the care plan section) Progress towards PT goals: Progressing toward goals    Frequency    Min 4X/week      PT Plan Current plan remains appropriate    Co-evaluation  AM-PAC PT "6 Clicks" Mobility   Outcome Measure  Help needed turning from your back to your side while in a flat bed without using bedrails?: A Little Help needed moving from lying on your back to sitting on the side of a flat bed without using bedrails?: A Little Help needed moving to and from a bed to a chair (including a wheelchair)?: A Little Help needed standing up from a chair using your arms (e.g., wheelchair or bedside chair)?: A Little Help needed to walk in hospital room?: A Little Help needed climbing 3-5 steps with a railing? : A Little 6 Click Score: 18    End of Session Equipment Utilized During Treatment: Gait belt Activity Tolerance: Patient tolerated treatment well Patient  left: in chair;with call bell/phone within reach;with chair alarm set   PT Visit Diagnosis: Unsteadiness on feet (R26.81);Difficulty in walking, not elsewhere classified (R26.2);Other symptoms and signs involving the nervous system (R29.898)     Time: 1583-0940 PT Time Calculation (min) (ACUTE ONLY): 15 min  Charges:  $Gait Training: 8-22 mins                     Abran Richard, PT Acute Rehab Services Pager [phone] [facility] 06/01/2020, 5:13 PM

## 2020-06-01 NOTE — Progress Notes (Signed)
Subjective:  Awake denies any complaints.  Beta-blocker held yesterday because of bradycardia.  Remains in A. fib with controlled ventricular response with occasional nonsustained VT on the monitor  Objective:  Vital Signs in the last 24 hours: Temp:  [97.6 F (36.4 C)-98.7 F (37.1 C)] 98.7 F (37.1 C) (05/31 0747) Pulse Rate:  [44-124] 73 (05/31 0747) Resp:  [17-20] 20 (05/31 0747) BP: (129-157)/(55-122) 129/97 (05/31 0747) SpO2:  [94 %-98 %] 96 % (05/31 0747)  Intake/Output from previous day: No intake/output data recorded. Intake/Output from this shift: No intake/output data recorded.  Physical Exam: Neck: no adenopathy, no carotid bruit, no JVD and supple, symmetrical, trachea midline Lungs: Decreased breath sounds at bases Heart: irregularly irregular rhythm, S1, S2 normal and 2/6 systolic and diastolic murmur noted Abdomen: soft, non-tender; bowel sounds normal; no masses,  no organomegaly Extremities: extremities normal, atraumatic, no cyanosis or edema  Lab Results: Recent Labs    05/31/20 0056 06/01/20 0300  WBC 11.9* 9.8  HGB 15.7 14.3  PLT 235 202   Recent Labs    05/31/20 0056 06/01/20 0300  NA 136 137  K 5.0 4.3  CL 105 105  CO2 23 22  GLUCOSE 125* 117*  BUN 24* 27*  CREATININE 1.60* 1.72*   No results for input(s): TROPONINI in the last 72 hours.  Invalid input(s): CK, MB Hepatic Function Panel Recent Labs    05/31/20 0056  PROT 8.4*  ALBUMIN 3.4*  AST 38  ALT 23  ALKPHOS 77  BILITOT 1.5*   Recent Labs    06/01/20 0300  CHOL 102   No results for input(s): PROTIME in the last 72 hours.  Imaging: Imaging results have been reviewed and DG Chest 1 View  Result Date: 05/31/2020 CLINICAL DATA:  Heart failure EXAM: CHEST  1 VIEW COMPARISON:  03/07/2018 FINDINGS: Post sternotomy changes. Cardiomegaly with vascular congestion and mild diffuse interstitial opacity, likely edema. Patchy atelectasis left base. No large effusion. Aortic  atherosclerosis. IMPRESSION: 1. Cardiomegaly with vascular congestion and probable mild interstitial edema. 2. Streaky atelectasis at the left lung base. Electronically Signed   By: Donavan Foil M.D.   On: 05/31/2020 19:33   NM Pulmonary Perfusion  Result Date: 05/31/2020 CLINICAL DATA:  Difficulty breathing EXAM: NUCLEAR MEDICINE PERFUSION LUNG SCAN TECHNIQUE: Perfusion images were obtained in multiple projections after intravenous injection of radiopharmaceutical. Ventilation scans intentionally deferred if perfusion scan and chest x-ray adequate for interpretation during COVID 19 epidemic. RADIOPHARMACEUTICALS:  4.4 mCi Tc-67m MAA IV COMPARISON:  Chest x-ray from earlier in the same day. FINDINGS: Adequate uptake is noted throughout both lungs. No wedge-shaped defects are identified to suggest pulmonary embolism. IMPRESSION: No evidence of pulmonary embolism. Electronically Signed   By: Inez Catalina M.D.   On: 05/31/2020 22:31   ECHOCARDIOGRAM COMPLETE  Result Date: 05/30/2020    ECHOCARDIOGRAM REPORT   Patient Name:   Jeffery Roach Date of Exam: 05/30/2020 Medical Rec #:  [id number]        Height:       72.0 in Accession #:    [id number]       Weight:       241.0 lb Date of Birth:  85-Aug-1929         BSA:          2.306 m Patient Age:    87 years         BP:           141/107 mmHg Patient Gender: M  HR:           75 bpm. Exam Location:  Inpatient Procedure: 3D Echo, Cardiac Doppler and Color Doppler Indications:    CVA  History:        Patient has no prior history of Echocardiogram examinations and                 Patient has prior history of Echocardiogram examinations, most                 recent 03/08/2018. CHF, CAD and Previous Myocardial Infarction,                 Stroke, Arrythmias:Atrial Fibrillation; Risk                 Factors:Hypertension, Diabetes, Dyslipidemia and obesity.  Sonographer:    Dustin Flock Referring Phys: [id number] [place]  1. Left  ventricular ejection fraction, by estimation, is 40 to 45%. The left ventricle has mildly decreased function. The left ventricle demonstrates global hypokinesis. The left ventricular internal cavity size was mildly dilated. Left ventricular diastolic function could not be evaluated. There is the interventricular septum is flattened in systole, consistent with right ventricular pressure overload.  2. Right ventricular systolic function was not well visualized. The right ventricular size is not well visualized. There is severely elevated pulmonary artery systolic pressure.  3. The mitral valve is abnormal. Mild mitral valve regurgitation.  4. Tricuspid valve regurgitation is mild to moderate.  5. The aortic valve is tricuspid. There is moderate calcification of the aortic valve. There is moderate thickening of the aortic valve. Aortic valve regurgitation is moderate.  6. The inferior vena cava is dilated in size with <50% respiratory variability, suggesting right atrial pressure of 15 mmHg. Comparison(s): Prior images unable to be directly viewed, comparison made by report only. Changes from prior study are noted. Echo [facility] 03/08/2018: EF 60-65%, normal RV, no significant valve disease. Conclusion(s)/Recommendation(s): No intracardiac source of embolism detected on this transthoracic study. A transesophageal echocardiogram is recommended to exclude cardiac source of embolism if clinically indicated. Very difficult windows for imaging. Recommend echo contrast for future studies. Irregular rhythm throughout. Appears globally hypokinetic, change from reported normal EF. Findings reported to Dr. Philipp Ovens and her team via Epic. FINDINGS  Left Ventricle: Left ventricular ejection fraction, by estimation, is 40 to 45%. The left ventricle has mildly decreased function. The left ventricle demonstrates global hypokinesis. The left ventricular internal cavity size was mildly dilated. There is  borderline left ventricular  hypertrophy. The interventricular septum is flattened in systole, consistent with right ventricular pressure overload. Left ventricular diastolic function could not be evaluated due to atrial fibrillation. Left ventricular diastolic function could not be evaluated. Right Ventricle: The right ventricular size is not well visualized. Right vetricular wall thickness was not well visualized. Right ventricular systolic function was not well visualized. There is severely elevated pulmonary artery systolic pressure. The tricuspid regurgitant velocity is 3.53 m/s, and with an assumed right atrial pressure of 15 mmHg, the estimated right ventricular systolic pressure is 76.2 mmHg. Left Atrium: Left atrial size was not well visualized. Right Atrium: Right atrial size was not well visualized. Pericardium: There is no evidence of pericardial effusion. Mitral Valve: The mitral valve is abnormal. Mild mitral valve regurgitation. Tricuspid Valve: The tricuspid valve is normal in structure. Tricuspid valve regurgitation is mild to moderate. Aortic Valve: Restricted movement of noncoronary cusp. The aortic valve is tricuspid. There is moderate calcification of the aortic  valve. There is moderate thickening of the aortic valve. Aortic valve regurgitation is moderate. Pulmonic Valve: The pulmonic valve was not well visualized. Pulmonic valve regurgitation is trivial. Aorta: The aortic arch was not well visualized. Venous: The inferior vena cava is dilated in size with less than 50% respiratory variability, suggesting right atrial pressure of 15 mmHg. IAS/Shunts: The interatrial septum was not well visualized.  LEFT VENTRICLE PLAX 2D LVIDd:         5.60 cm  Diastology LVIDs:         4.30 cm  LV e' medial:    5.87 cm/s LV PW:         1.10 cm  LV E/e' medial:  14.3 LV IVS:        1.10 cm  LV e' lateral:   8.70 cm/s LVOT diam:     2.20 cm  LV E/e' lateral: 9.7 LV SV:         51 LV SV Index:   22 LVOT Area:     3.80 cm  RIGHT VENTRICLE RV  Basal diam:  3.10 cm RV S prime:     6.20 cm/s LEFT ATRIUM             Index       RIGHT ATRIUM           Index LA diam:        4.50 cm 1.95 cm/m  RA Area:     16.50 cm LA Vol (A2C):   49.1 ml 21.29 ml/m RA Volume:   42.90 ml  18.60 ml/m LA Vol (A4C):   54.1 ml 23.46 ml/m LA Biplane Vol: 51.7 ml 22.42 ml/m  AORTIC VALVE LVOT Vmax:   77.00 cm/s LVOT Vmean:  39.300 cm/s LVOT VTI:    0.135 m  AORTA Ao Root diam: 3.10 cm MITRAL VALVE               TRICUSPID VALVE MV Area (PHT): 3.99 cm    TR Peak grad:   49.8 mmHg MV Decel Time: 190 msec    TR Vmax:        353.00 cm/s MV E velocity: 84.00 cm/s MV A velocity: 34.30 cm/s  SHUNTS MV E/A ratio:  2.45        Systemic VTI:  0.14 m                            Systemic Diam: 2.20 cm Buford Dresser MD Electronically signed by Buford Dresser MD Signature Date/Time: 05/30/2020/5:52:47 PM    Final     Cardiac Studies:  Assessment/Plan:  Cardioembolic CVA Chronic atrial fibrillation chads Vasc score of 8 on chronic anticoagulation Chronic atrial fibrillation with paroxysmal slow and rapid ventricular response 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 daily  LOS: 1 day    Charolette Forward 06/01/2020, 11:49 AM

## 2020-06-01 NOTE — TOC Benefit Eligibility Note (Signed)
Transition of Care [facility]) Benefit Eligibility Note    Patient Details  Name: Jeffery Roach MRN: [id number] Date of Birth: 09-13-1927   Medication/Dose: Eliquis 18m. BID for 30 day supply  Covered?: No  Tier:  (exception)  Prescription Coverage Preferred [facility]: [facility]  Spoke with Person/Company/Phone Number:: SMarguarite ArbourS.  [employer] PH.#[phone] Co-Pay: $254..94  Prior Approval: Yes ([phone]  Deductible: Met  Additional Notes: (S) Alternative Drug:( Warfarin 139m $1.00)    HaShelda Alteshone Number: 06/01/2020, 9:56 AM

## 2020-06-01 NOTE — Progress Notes (Signed)
Subjective:  HD2  [name] says he was able to get better sleep last night. He denies any CP or palpitations or light-headedness. He is experiencing constipation which is new in the hospital. He has a BM daily at home. He says he was able to eat some breakfast this morning and denies any abdominal pain. He says some days he talks better than others.   Objective:  Vital signs in last 24 hours: Vitals:   05/31/20 1539 05/31/20 2018 05/31/20 2350 06/01/20 0344  BP: (!) 154/122 (!) 155/55 (!) 157/57 137/72  Pulse:  (!) 124 (!) 44 82  Resp:  19 17 19   Temp:  97.6 F (36.4 C) 98.3 F (36.8 C) 98.1 F (36.7 C)  TempSrc:  Oral Oral Oral  SpO2:  98% 98% 94%  Weight:      Height:       Physical Exam: General: Laying in bed comfortably in no acute distress CV: Irregular rate, rhythm. Systolic murmur present. Pulm: Normal work of breathing on room air. Clear to auscultation bilaterally. Abdomen: Soft, non-tender, non-distended. Neuro: Awake, alert, answering questions appropriately. Dysarthria and expressive dysphasia present.  CBC Latest Ref Rng & Units 06/01/2020 05/31/2020 05/30/2020  WBC 4.0 - 10.5 K/uL 9.8 11.9(H) 11.3(H)  Hemoglobin 13.0 - 17.0 g/dL 14.3 15.7 14.3  Hematocrit 39.0 - 52.0 % 44.6 49.9 46.0  Platelets 150 - 400 K/uL 202 235 190   BMP Latest Ref Rng & Units 06/01/2020 05/31/2020 05/30/2020  Glucose 70 - 99 mg/dL 117(H) 125(H) 133(H)  BUN 8 - 23 mg/dL 27(H) 24(H) 24(H)  Creatinine 0.61 - 1.24 mg/dL 1.72(H) 1.60(H) 1.77(H)  Sodium 135 - 145 mmol/L 137 136 137  Potassium 3.5 - 5.1 mmol/L 4.3 5.0 4.5  Chloride 98 - 111 mmol/L 105 105 105  CO2 22 - 32 mmol/L 22 23 25   Calcium 8.9 - 10.3 mg/dL 9.0 9.1 8.8(L)   Assessment/Plan: Jai Steil is 87yo cismale with prior CVA with residual right-sided deficits, chronic atrial fibrillation on Xarelto, rectal cancer, prostate cancer, CAD s/p CABG 2005, CKDIIIb, chronic diastolic heart failure (EF 60-65% in 03/2018),  hypertension, type 2 diabetes mellitus admitted 2/42 for acute embolic CVA of cerebellum and right occipital cortex, ongoing medical work-up and pending CIR placement.  Active Problems:   Atrial fibrillation with slow ventricular response (HCC)   Acute CVA (cerebrovascular accident) ([place])   Severe pulmonary arterial systolic hypertension (HCC)   Pressure injury of skin  #Acute CVA of cerebellum, right occipital cortex, likely cardioembolic Patient remains hemodynamically stable, no new neurological deficits appreciated. Appreciate neurology's recommendations. Currently stable from neurological perspective, pending CIR bed for further physical therapy. - ASA 81mg  daily - PT/OT - Follow-up with CIR - Follow-up with neurology in outpatient setting  #Severe pulmonary hypertension #Chronic diastolic heart failure TTE on 5/30 showed severe pulmonary hypertension, previous TTE in 2018 without evidence of this. Patient did reportedly have another TTE in 2020 but have not seen these records. V/Q scan performed yesterday to evaluate for chronic thromboembolic pulmonary hypertension, which resulted in a normal study. Unclear etiology, will touch base with cardiology on any further recommendations. - Follow-up with cardiology - Daily CBC, BMP  #Chronic atrial fibrillation on anticoagulation #Frequent PVC's, intermittent slow ventricular response Tele reviewed this morning, continues to have high PVC burden, although improved from yesterday. HR sustained in the 60-70's throughout the night, although he is becoming more tachycardic this morning. Per cardiology, will switch from metoprolol to amiodarone. Appreciate recommendations. We have also  switched his anticoagulation to Eliquis given he failed Xarelto therapy with this new stroke. - Stop metoprolol and rivaroxaban - Start amiodarone 200mg  daily - Eliquis 2.5mg  twice daily (renally dosed) - Tele  #Type 2 diabetes mellitus Last A1c 8.0% a few days  prior to admission. CBG's continue to be at goal. - CBG monitoring - SSI  #Chronic kidney disease stage IIIb Renal function at baseline. - Daily BMP - I/O - Avoid nephrotoxins  DIET: DYS2 IVF: n/a DVT PPX: Eliquis BOWEL: n/a CODE: FULL FAM COM:  Attempted to reach wife, Barbaraann Share, but unsuccessful  Prior to Admission Living Arrangement: Home Anticipated Discharge Location: CIR Barriers to Discharge: medical management Dispo: Anticipated discharge in approximately 1-2 day(s).   Sanjuan Dame, MD 06/01/2020, 6:30 AM Pager: [phone] After 5pm on weekdays and 1pm on weekends: On Call pager [phone]

## 2020-06-01 NOTE — Progress Notes (Signed)
  Speech Language Pathology Treatment: Dysphagia;Cognitive-Linquistic  Patient Details Name: Jeffery Roach MRN: [id number] DOB: 01/30/1927 Today's Date: 06/01/2020 Time: 9371-6967 SLP Time Calculation (min) (ACUTE ONLY): 23 min  Assessment / Plan / Recommendation Clinical Impression  Pt seen for skilled dysphagia treatment focused on diet tolerance and upgraded trials of mechanical soft solids. No overt s/sx of aspiration or dysphagia noted with x5 solid trials, except for R labial spillage x1, however pt was attempting to talk at the time. Thin liquids via straw sips resulted in immediate throat clearing x2, which was eliminated with min verbal cues for small sips. Recommend advancement to dysphagia 3/thin liquid diet. Will f/u for tolerance.   Multiple methods of communication trailed this date in order to assess effectiveness in improving communication of basic want/needs and ideas. Offering choices and y/n questions, asking clarification questions and encouraging use of gestures improved pt ability to communicate simple preferences and biographical information. Speech intelligibility improved at the short phrase level given moderate verbal cues for overarticulation, however phonemic paraphasias and word finding difficulties continue to further impact ability to communicate. Written word and spelling cues not effective. Will continue efforts to increase functional communication. Above communicated to RN.   HPI HPI: Pt is a 87 y/o male admitted 05/30/20 with slurred speech. MRI reveals small acute infarct in bilateral cerebellum, R occipital cortex, and R pon; small remote Bil cerebellar infarcts, L remote L MCA branch infarct. MRA revealed no flow through R vertebral artery. PMH includes: CVA with residual R sided deficits and chronic expressive aphasia, afib on xarelto, rectal/prostate cancer, CKD< CAD s/p CABG 2005, CHF, HTN, DM 2.      SLP Plan  Continue with current plan of care        Recommendations  Diet recommendations: Dysphagia 3 (mechanical soft);Thin liquid Liquids provided via: Cup;Straw Medication Administration: Whole meds with puree Supervision: Patient able to self feed;Full supervision/cueing for compensatory strategies Compensations: Minimize environmental distractions;Slow rate;Small sips/bites;Follow solids with liquid Postural Changes and/or Swallow Maneuvers: Seated upright 90 degrees                Oral Care Recommendations: Oral care BID Follow up Recommendations: Inpatient Rehab;Other (comment) SLP Visit Diagnosis: Dysarthria and anarthria (R47.1);Aphasia (R47.01);Dysphagia, unspecified (R13.10) Plan: Continue with current plan of care       [place], [place], [place] Office Number: [phone]   Jeffery Roach 06/01/2020, 3:31 PM

## 2020-06-02 LAB — BASIC METABOLIC PANEL
Anion gap: 10 (ref 5–15)
BUN: 30 mg/dL — ABNORMAL HIGH (ref 8–23)
CO2: 22 mmol/L (ref 22–32)
Calcium: 9.4 mg/dL (ref 8.9–10.3)
Chloride: 104 mmol/L (ref 98–111)
Creatinine, Ser: 2.12 mg/dL — ABNORMAL HIGH (ref 0.61–1.24)
GFR, Estimated: 29 mL/min — ABNORMAL LOW (ref >60)
Glucose, Bld: 142 mg/dL — ABNORMAL HIGH (ref 70–99)
Potassium: 3.8 mmol/L (ref 3.5–5.1)
Sodium: 136 mmol/L (ref 135–145)

## 2020-06-02 LAB — GLUCOSE, CAPILLARY
Glucose-Capillary: 137 mg/dL — ABNORMAL HIGH (ref 70–99)
Glucose-Capillary: 174 mg/dL — ABNORMAL HIGH (ref 70–99)
Glucose-Capillary: 150 mg/dL — ABNORMAL HIGH (ref 70–99)
Glucose-Capillary: 153 mg/dL — ABNORMAL HIGH (ref 70–99)

## 2020-06-02 LAB — CBC
HCT: 44.8 % (ref 39.0–52.0)
Hemoglobin: 14.5 g/dL (ref 13.0–17.0)
MCH: 28.4 pg (ref 26.0–34.0)
MCHC: 32.4 g/dL (ref 30.0–36.0)
MCV: 87.7 fL (ref 80.0–100.0)
Platelets: 196 10*3/uL (ref 150–400)
RBC: 5.11 MIL/uL (ref 4.22–5.81)
RDW: 14.3 % (ref 11.5–15.5)
WBC: 9.2 10*3/uL (ref 4.0–10.5)
nRBC: 0 % (ref 0.0–0.2)

## 2020-06-02 NOTE — Progress Notes (Signed)
Inpatient Rehabilitation Admissions Coordinator  I met with patient ,his wife, and daughter, Baker Janus at bedside. We discussed the benefits of a short CIR admit to reach Mod I level before returning home. He is in agreement. I discussed that I will have to see if I will have a bed in the next 24 to 48 hrs, otherwise he may progress to go home with [facility]. I will follow up in the morning with bed availability.  Danne Baxter, RN, MSN Rehab Admissions Coordinator [phone] 06/02/2020 3:23 PM

## 2020-06-02 NOTE — Progress Notes (Signed)
Subjective:  Patient denies any chest pain or shortness of breath up in chair remains in A. fib with controlled ventricular response occasional PVCs couplets and 1 triplet noted.  No further significant V. tach's.  Objective:  Vital Signs in the last 24 hours: Temp:  [97.8 F (36.6 C)-98.9 F (37.2 C)] 98.9 F (37.2 C) (06/01 0812) Pulse Rate:  [52-77] 72 (06/01 0812) Resp:  [18-20] 20 (06/01 0812) BP: (141-161)/(58-83) 141/58 (06/01 0812) SpO2:  [93 %-98 %] 95 % (06/01 0812)  Intake/Output from previous day: 05/31 0701 - 06/01 0700 In: -  Out: 400 [Urine:400] Intake/Output from this shift: No intake/output data recorded.  Physical Exam: Exam unchanged  Lab Results: Recent Labs    06/01/20 0300 06/02/20 0242  WBC 9.8 9.2  HGB 14.3 14.5  PLT 202 196   Recent Labs    06/01/20 0300 06/02/20 0242  NA 137 136  K 4.3 3.8  CL 105 104  CO2 22 22  GLUCOSE 117* 142*  BUN 27* 30*  CREATININE 1.72* 2.12*   No results for input(s): TROPONINI in the last 72 hours.  Invalid input(s): CK, MB Hepatic Function Panel Recent Labs    05/31/20 0056  PROT 8.4*  ALBUMIN 3.4*  AST 38  ALT 23  ALKPHOS 77  BILITOT 1.5*   Recent Labs    06/01/20 0300  CHOL 102   No results for input(s): PROTIME in the last 72 hours.  Imaging: Imaging results have been reviewed and DG Chest 1 View  Result Date: 05/31/2020 CLINICAL DATA:  Heart failure EXAM: CHEST  1 VIEW COMPARISON:  03/07/2018 FINDINGS: Post sternotomy changes. Cardiomegaly with vascular congestion and mild diffuse interstitial opacity, likely edema. Patchy atelectasis left base. No large effusion. Aortic atherosclerosis. IMPRESSION: 1. Cardiomegaly with vascular congestion and probable mild interstitial edema. 2. Streaky atelectasis at the left lung base. Electronically Signed   By: Donavan Foil M.D.   On: 05/31/2020 19:33   NM Pulmonary Perfusion  Result Date: 05/31/2020 CLINICAL DATA:  Difficulty breathing EXAM:  NUCLEAR MEDICINE PERFUSION LUNG SCAN TECHNIQUE: Perfusion images were obtained in multiple projections after intravenous injection of radiopharmaceutical. Ventilation scans intentionally deferred if perfusion scan and chest x-ray adequate for interpretation during COVID 19 epidemic. RADIOPHARMACEUTICALS:  4.4 mCi Tc-75m MAA IV COMPARISON:  Chest x-ray from earlier in the same day. FINDINGS: Adequate uptake is noted throughout both lungs. No wedge-shaped defects are identified to suggest pulmonary embolism. IMPRESSION: No evidence of pulmonary embolism. Electronically Signed   By: Inez Catalina M.D.   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 daily Okay to discharge from cardiac point of view I will sign off Follow-up with me in 2 weeks  LOS: 2 days    Charolette Forward 06/02/2020, 11:47 AM

## 2020-06-02 NOTE — Progress Notes (Addendum)
Subjective:   No acute events overnight. Patient doing well, sitting up at the edge of the bed eating breakfast. He denies difficulty swallowing or having trouble eating/drinking. Encouraged him to drink more water.   Discussed with him plan today will be to determine next steps, whether that be CIR vs home health services. Patient's wife and daughter coming by today to discuss this further with the patient. Patient quite adamant about going home. Voiced our concern of him going home and falling, patient would still like to go home. Family meeting at 3pm today.   Denies feeling his heart racing, dizziness, lightheadedness.   Objective:  Vital signs in last 24 hours: Vitals:   06/01/20 1226 06/01/20 1937 06/01/20 2347 06/02/20 0350  BP: (!) 152/83 (!) 156/83 (!) 161/82 (!) 146/79  Pulse: (!) 52 71 77 72  Resp:  18 18 18   Temp: 98.6 F (37 C) 98.6 F (37 C) 97.8 F (36.6 C) 98.6 F (37 C)  TempSrc: Oral Oral Oral Oral  SpO2: 95% 98% 97% 93%  Weight:      Height:       Physical exam: General: Sitting up in bed, comfortable, NAD. CV: Regular rate, irregularly irregular rhythm.  Systolic murmur present. Pulm: Clear to auscultation bilaterally, no adventitious sounds noted.  Normal work of breathing on room air. Abdomen: Soft, nontender, nondistended. Neuro: Awake and alert, answering questions appropriately.  Expressive dysarthria noted, chronic. Skin: Warm and dry.  Assessment/Plan:  Active Problems:   Atrial fibrillation with slow ventricular response (HCC)   Acute CVA (cerebrovascular accident) ([place])   Severe pulmonary arterial systolic hypertension ([place])   Pressure injury of skin  Jeffery Roach is a 87 year old male with history of prior MCA CVA with residual right-sided deficits, chronic atrial fibrillation, rectal cancer, prostate cancer, CAD status post CABG 2005, CKD 3B, chronic diastolic heart failure (EF 60 to 65% in 03/2018), type 2 diabetes admitted for acute  embolic CVA of bilateral cerebellum and right occipital cortex, now medically stable and pending disposition planning.  Acute bilateral cerebellar CVA Acute right occipital cortex CVA History of prior left MCA CVA with residual right-sided deficits Patient remains hemodynamically stable, no new neurological deficits noted today.  He continues to have chronic expressive dysarthria and right-sided sensation deficits, both of which are chronic and at baseline.  As per neurology, patient is medically stable for discharge with plan for neurology follow-up as an outpatient in 4 weeks at [facility].  Continue aspirin 81 mg daily, Lipitor 10 mg daily, Eliquis 2.5 mg twice daily for secondary stroke prevention.  Family meeting at 3 PM today with inpatient rehab coordinator to decide between CIR (as recommended by PT/OT) vs Home Health (preferred by patient). -Continue ASA 81mg  daily, lipitor 10mg  daily, eliquis 2.5mg  BID  Chronic atrial fibrillation, on Eliquis High PVC burden Patient remains in atrial fibrillation but with controlled ventricular response.  Telemetry showing only occasional PVC couplets and 1 triplet.  No further significant runs of V. tach noted.  Greatly appreciate cardiology recommendations.  Plan to continue patient on amiodarone 100 mg daily at discharge until he follows up with cardiology in 2 weeks. -Continue amiodarone 100 mg daily -Continue Eliquis 2.5 mg twice daily -Telemetry  Severe pulmonary hypertension, unclear etiology Chronic heart failure with reduced EF (EF 40-45%) TTE on 05/31/2020 showing severe pulmonary hypertension not evident on prior TTE in 2018.  V/Q scan without abnormalities.  Per cardiology, no need for further work-up of this while inpatient.  CKD stage IIIb Baseline creatinine between 1.7-1.9. Creatinine slightly increased from 1.72 > 2.12, with GFR 29 this AM. -Daily BMP -Avoid nephrotoxic medications -Monitor I/O's  Type 2  diabetes mellitus Last hemoglobin A1c 8.0% few days prior to admission.  CBGs continue to be at goal. -CBG monitoring -By  Prior to Admission Living Arrangement: Home Anticipated Discharge Location: CIR vs HH Barriers to Discharge: pending disposition Dispo: Anticipated discharge in approximately 0-1 day(s).   Virl Axe, MD 06/02/2020, 6:58 AM Pager: [phone] After 5pm on weekdays and 1pm on weekends: On Call pager [phone]

## 2020-06-02 NOTE — Progress Notes (Signed)
Occupational Therapy Treatment Patient Details Name: Jeffery Roach MRN: [id number] DOB: [birth year] Today's Date: 06/02/2020    History of present illness Pt is a 87 y/o male admitted 05/30/20 with slurred speech. MRI reveals small acute infarct in bilateral cerebellum, R occipital cortex, and R pon; small remote Bil cerebellar infarcts, L remote L MCA branch infarct. MRA revealed no flow through R vertebral artery. PMH includes: CVA with residual R sided deficits and chronic expressive aphasia, afib on xarelto, rectal/prostate cancer, CKD< CAD s/p CABG 2005, CHF, HTN, DM 2.   OT comments  Patient with continued progress towards patient focused OT goals.  Patient able to stand at sink with min guard and min a to preform stand grooming.  Encouraged the patient to incorporate his R upper extremity into functional tasks.  General min guard to supervision for mobility in the room.  Patient's lunch tray arrived, and minimal setup to eat.  Barriers are listed below, OT will continue to follow in the acute setting to maximize functional status.  CIR is being considered post acute.    Follow Up Recommendations  CIR    Equipment Recommendations       Recommendations for Other Services      Precautions / Restrictions Precautions Precautions: Fall Precaution Comments: Residual R side deficits from prior stroke Restrictions Weight Bearing Restrictions: No       Mobility Bed Mobility Overal bed mobility: Needs Assistance Bed Mobility: Supine to Sit     Supine to sit: Supervision          Transfers Overall transfer level: Needs assistance Equipment used: None Transfers: Sit to/from Stand Sit to Stand: Supervision              Balance Overall balance assessment: Needs assistance Sitting-balance support: Feet supported;No upper extremity supported Sitting balance-Leahy Scale: Good     Standing balance support: No upper extremity supported;During functional activity Standing  balance-Leahy Scale: Fair Standing balance comment: min guard for activities at the sink in stand.                           ADL either performed or assessed with clinical judgement   ADL       Grooming: Wash/dry hands;Wash/dry face;Standing;Minimal assistance Grooming Details (indicate cue type and reason): VC's to incorporate R hand and arm with functional tasks, even as stabilize assist.                 Toilet Transfer: Minimal assistance;Ambulation           Functional mobility during ADLs: Minimal assistance;Cueing for safety                         Cognition Arousal/Alertness: Awake/alert Behavior During Therapy: WFL for tasks assessed/performed Overall Cognitive Status: Difficult to assess Area of Impairment: Problem solving;Awareness                             Problem Solving: Slow processing;Requires verbal cues;Difficulty sequencing                            Pertinent Vitals/ Pain       Pain Assessment: No/denies pain  Frequency  Min 2X/week        Progress Toward Goals  OT Goals(current goals can now be found in the care plan section)  Progress towards OT goals: Progressing toward goals  Acute Rehab OT Goals Patient Stated Goal: To do more OT Goal Formulation: With patient Time For Goal Achievement: 06/14/20 Potential to Achieve Goals: Good  Plan Discharge plan remains appropriate    Co-evaluation                 AM-PAC OT "6 Clicks" Daily Activity     Outcome Measure   Help from another person eating meals?: None Help from another person taking care of personal grooming?: A Little Help from another person toileting, which includes using toliet, bedpan, or urinal?: A Little Help from another person bathing (including washing, rinsing, drying)?: A Little Help from another person to put on and taking off  regular upper body clothing?: A Little Help from another person to put on and taking off regular lower body clothing?: A Little 6 Click Score: 19    End of Session Equipment Utilized During Treatment: Gait belt  OT Visit Diagnosis: Other abnormalities of gait and mobility (R26.89);History of falling (Z91.81);Muscle weakness (generalized) (M62.81);Cognitive communication deficit (R41.841)   Activity Tolerance Patient tolerated treatment well   Patient Left in chair;with call bell/phone within reach;with chair alarm set   Nurse Communication Other (comment) (eating lunch)        Time: 3335-4562 OT Time Calculation (min): 18 min  Charges: OT General Charges $OT Visit: 1 Visit OT Treatments $Self Care/Home Management : 8-22 mins  06/02/2020  Rich, OTR/L  Acute Rehabilitation Services  Office:  [place] 06/02/2020, 3:35 PM

## 2020-06-02 NOTE — Plan of Care (Signed)
  Problem: Education: Goal: Knowledge of disease or condition will improve Outcome: Progressing Goal: Knowledge of secondary prevention will improve Outcome: Progressing Goal: Knowledge of patient specific risk factors addressed and post discharge goals established will improve Outcome: Progressing Goal: Individualized Educational Video(s) Outcome: Progressing   

## 2020-06-02 NOTE — PMR Pre-admission (Signed)
PMR Admission Coordinator Pre-Admission Assessment  Patient: Jeffery Roach is an 87 y.o., male MRN: [id number] DOB: [birth year] Height: 6' (182.9 cm) Weight: 104.6 kg              Insurance Information HMO:     PPO:      PCP:      IPA:      80/20:      OTHER:  PRIMARY: Medicare a and b      Policy#: [id number]      Subscriber: pt Benefits:  Phone #: passport one online     Name: 6/1 Eff. Date: 08/03/1994     Deduct: $1556      Out of Pocket Max: none      Life Max: none  CIR: 100%      SNF: 20 full days Outpatient: 80%     Co-Pay: 20% Home Health: 1005      Co-Pay: none DME: 80%     Co-Pay: 20% Providers: pt choice  SECONDARY: AARP supplement      Policy#: [id number]       Financial Counselor:       Phone#:   The "Data Collection Information Summary" for patients in Inpatient Rehabilitation Facilities with attached "Privacy Act [place] Records" was provided and verbally reviewed with: Patient and Family  Emergency Contact Information Contact Information    Name Relation Home Work [place], [place]   Maurico, Perrell Son   [phone]   Synthia Innocent Daughter   [phone]     Current Medical History  Patient Admitting Diagnosis: CVA  History of Present Illness: 87 y.o. 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 He was found to have small acute infarct in bilateral cerebellum, right occipital cortex and possibly right pons with no flow throughout most of R-VA with distal V4 segment reconstitution and remover large L-MCA infarct.  2 D echo showed EF 40-45% with global hypokinesis and severely elevated PAH. Stroke felt to embolic in setting of CAF and he continues on low dose ASA/Xarelto.  Dr. Terrence Dupont consulted for input on A fib with RVR and occasional NSVT. He recommended  addition of metoprolol for rate control as well as Entresto for depressed cardiac function. Therapy evaluations completed revealing severe dysarthria with mild oropharyngeal dysphagia--started on DII, thin liquids due to mild aspiration risk, balance deficits and diplopia affecting functional mobility and ADLs.  Past Medical History  Past Medical History:  Diagnosis Date  . Acute on chronic systolic (congestive) heart failure ([place])   . Anemia due to stage 3 chronic kidney disease ([place])   . Arteriosclerotic coronary artery disease   . Atrial fibrillation ([place])   . Benign hypertension with chronic kidney disease, stage III ([place])   . Cerebral artery occlusion with cerebral infarction ([place])   . Cortical age-related cataract of both eyes   . Dermatochalasis of both upper eyelids   . Diabetes mellitus with stage 3 chronic kidney disease ([place])   . Gastroesophageal reflux disease without esophagitis   . History of heart attack   . Hypercalcemia   . Hyperlipidemia   . Hyperopia with astigmatism and presbyopia, bilateral   . Hypertension with goal to be determined   . Hyperthyroidism   . Hypokalemia   . LV dysfunction   . Malignant neoplasm of prostate ([place])   .  Metabolic bone disease   . Myocardial infarction ([place])   . Nuclear sclerotic cataract of both eyes   . Personal history of rectal cancer   . Toxic multinodular goiter   . Toxic multinodular goiter   . Transient ischemic attack   . Type 2 diabetes mellitus with hyperglycemia ([place])   . Vitamin D deficiency   . Vitreous floaters of both eyes    Family History  family history includes Diabetes in his sister; Hypertension in his sister; Stroke in his sister.  Prior Rehab/Hospitalizations:  Has the patient had prior rehab or hospitalizations prior to admission? Yes  Has the patient had major surgery during 100 days prior to admission? No  Current Medications   Current Facility-Administered Medications:  .  acetaminophen  (TYLENOL) tablet 650 mg, 650 mg, Oral, Q4H PRN **OR** acetaminophen (TYLENOL) 160 MG/5ML solution 650 mg, 650 mg, Per Tube, Q4H PRN **OR** acetaminophen (TYLENOL) suppository 650 mg, 650 mg, Rectal, Q4H PRN, Aslam, Sadia, MD .  amiodarone (PACERONE) tablet 100 mg, 100 mg, Oral, Daily, Harwani, Mohan, MD, 100 mg at 06/03/20 1001 .  apixaban (ELIQUIS) tablet 2.5 mg, 2.5 mg, Oral, BID, Amedeo Plenty, RPH, 2.5 mg at 06/03/20 1001 .  aspirin EC tablet 81 mg, 81 mg, Oral, Daily, Aslam, Sadia, MD, 81 mg at 06/03/20 1000 .  atorvastatin (LIPITOR) tablet 10 mg, 10 mg, Oral, Daily, Sanjuan Dame, MD, 10 mg at 06/03/20 1001 .  chlorhexidine (PERIDEX) 0.12 % solution 15 mL, 15 mL, Mouth Rinse, BID, Sanjuan Dame, MD, 15 mL at 06/03/20 1001 .  insulin aspart (novoLOG) injection 0-15 Units, 0-15 Units, Subcutaneous, TID WC, Sanjuan Dame, MD, 2 Units at 06/03/20 0700 .  insulin aspart (novoLOG) injection 0-5 Units, 0-5 Units, Subcutaneous, QHS, Sanjuan Dame, MD .  MEDLINE mouth rinse, 15 mL, Mouth Rinse, q12n4p, Sanjuan Dame, MD, 15 mL at 06/02/20 1630 .  sodium chloride flush (NS) 0.9 % injection 3 mL, 3 mL, Intravenous, Once, Horton, Kristie M, DO  Patients Current Diet:  Diet Order            Diet - low sodium heart healthy           DIET DYS 3 Room service appropriate? Yes; Fluid consistency: Thin  Diet effective now                 Precautions / Restrictions Precautions Precautions: Fall Precaution Comments: Residual R side deficits from prior stroke Restrictions Weight Bearing Restrictions: No   Has the patient had 2 or more falls or a fall with injury in the past year?No  Prior Activity Level Community (5-7x/wk): Mod I with ambulation and adls; did not drive  Prior Functional Level Prior Function Level of Independence: Independent Comments: independent with ADLs, mobility; spouse completes IADLs and med mgmt  Self Care: Did the patient need help bathing,  dressing, using the toilet or eating?  Independent  Indoor Mobility: Did the patient need assistance with walking from room to room (with or without device)? Independent  Stairs: Did the patient need assistance with internal or external stairs (with or without device)? Independent  Functional Cognition: Did the patient need help planning regular tasks such as shopping or remembering to take medications? Independent  Home Assistive Devices / Equipment Home Equipment: Cane - single point  Prior Device Use: Indicate devices/aids used by the patient prior to current illness, exacerbation or injury? None of the above  Current Functional Level Cognition  Overall Cognitive Status: Difficult to assess Difficult  to assess due to: Impaired communication Orientation Level: Oriented X4 General Comments: Difficult to understand due to chronic aphasia but following all commands Attention: Sustained Sustained Attention: Appears intact Awareness: Impaired Awareness Impairment: Emergent impairment Safety/Judgment: Appears intact    Extremity Assessment (includes Sensation/Coordination)  Upper Extremity Assessment: RUE deficits/detail RUE Deficits / Details: baseline weakness, decreased functional use from chronic CVA with limited shoulder ROM and decreased coordination RUE Coordination: decreased fine motor,decreased gross motor  Lower Extremity Assessment: Defer to PT evaluation RLE Deficits / Details: Bilaterally, 5/5 strength in quads, hamstrings, and ankle dorsiflexors. 4+/5 strength in B hip flexors. RLE Sensation: decreased light touch    ADLs  Overall ADL's : Needs assistance/impaired Grooming: Wash/dry hands,Wash/dry face,Standing,Minimal assistance Grooming Details (indicate cue type and reason): VC's to incorporate R hand and arm with functional tasks, even as stabilize assist. Upper Body Dressing : Sitting,Set up Upper Body Dressing Details (indicate cue type and reason): to down  back gown Lower Body Dressing: Minimal assistance,Sit to/from stand Lower Body Dressing Details (indicate cue type and reason): able to manage socks, min assist in standing Toilet Transfer: Minimal assistance,Ambulation Toilet Transfer Details (indicate cue type and reason): no AD, simulated Functional mobility during ADLs: Minimal assistance,Cueing for safety General ADL Comments: pt limtied by balance, activity tolerance    Mobility  Overal bed mobility: Needs Assistance Bed Mobility: Supine to Sit Supine to sit: Supervision General bed mobility comments: supervision for safety. Use of momentum for trunk elevation    Transfers  Overall transfer level: Needs assistance Equipment used: None Transfers: Sit to/from Stand Sit to Stand: Supervision General transfer comment: supervision for safety. repeated sit to stand x 10 from recliner with use of momentum to rise    Ambulation / Gait / Stairs / Emergency planning/management officer  Ambulation/Gait Ambulation/Gait assistance: Herbalist (Feet): 250 Feet Assistive device: None Gait Pattern/deviations: Step-through pattern,Decreased stride length,Wide base of support,Decreased weight shift to right General Gait Details: minA for balance due to unsteadiness with no AD. Decreased weight shift to R during ambulation with short shuffle steps at times.Cues for forward gaze during ambulation due to patient tending to look at ground Gait velocity: decreased Gait velocity interpretation: <1.8 ft/sec, indicate of risk for recurrent falls    Posture / Balance Dynamic Sitting Balance Sitting balance - Comments: dynamically able to managed socks, but posterior lean during LB MMT. Balance Overall balance assessment: Needs assistance Sitting-balance support: Feet supported,No upper extremity supported Sitting balance-Leahy Scale: Good Sitting balance - Comments: dynamically able to managed socks, but posterior lean during LB MMT. Standing balance  support: No upper extremity supported,During functional activity Standing balance-Leahy Scale: Fair Standing balance comment: min guard for activities at the sink in stand.    Special needs/care consideration Colostomy  Pta Patient loves to play poker and checkers   Previous Home Environment  Living Arrangements: Spouse/significant other  Lives With: Spouse Available Help at Discharge: Family,Available 24 hours/day Type of Home: House Home Layout: One level Home Access: Stairs to enter [employer] of Steps: 2 Bathroom Shower/Tub: Multimedia programmer: Standard Bathroom Accessibility: Yes How Accessible: Accessible via walker [place]: No Additional Comments: Patient loves to play checkers and poker  Discharge Living Setting Plans for Discharge Living Setting: Patient's home,Lives with (comment) (wife of 52 years) Type of Home at Discharge: House Discharge Home Layout: One level Discharge Home Access: Stairs to enter Entrance Stairs-Rails: None Entrance Stairs-Number of Steps: 2 Discharge Bathroom Shower/Tub: Horticulturist, commercial: Standard  Discharge Bathroom Accessibility: Yes How Accessible: Accessible via walker Does the patient have any problems obtaining your medications?: No  Social/Family/Support Systems Patient Roles: Spouse,Parent Contact Information: wife and daughter, Baker Janus Anticipated Caregiver: wife and family Anticipated Caregiver's Contact Information: see above Ability/Limitations of Caregiver: Baker Janus works, wife is also of advanced age Caregiver Availability: 24/7 Discharge Plan Discussed with Primary Caregiver: Yes Is Caregiver In Agreement with Plan?: Yes Does Caregiver/Family have Issues with Lodging/Transportation while Pt is in Rehab?: No  Goals Patient/Family Goal for Rehab: Mod I to supervision with PT, OT and SLP Expected length of stay: [facility] 5 to 7 days Pt/Family Agrees to Admission and willing to  participate: Yes Program Orientation Provided & Reviewed with Pt/Caregiver Including Roles  & Responsibilities: Yes  Decrease burden of Care through IP rehab admission: n/a  Possible need for SNF placement upon discharge:not anticipated  Patient Condition: This patient's medical and functional status has changed since the consult dated 06/01/2020 in which the Rehabilitation Physician determined and documented that the patient was potentially appropriate for intensive rehabilitative care in an inpatient rehabilitation facility. Issues have been addressed and update has been discussed with Dr. Naaman Plummer and patient now appropriate for inpatient rehabilitation. Will admit to inpatient rehab today.   Preadmission Screen Completed By:  Cleatrice Burke, RN, 06/03/2020 10:19 AM ______________________________________________________________________   Discussed status with Dr. Dagoberto Ligas on 06/03/2020 at  69 and received approval for admission today.  Admission Coordinator:  Cleatrice Burke, time 9672 Date 06/03/2020

## 2020-06-02 NOTE — Progress Notes (Signed)
Physical Therapy Treatment Patient Details Name: Jeffery Roach MRN: [id number] DOB: Feb 21, 1927 Today's Date: 06/02/2020    History of Present Illness Pt is a 87 y/o male admitted 05/30/20 with slurred speech. MRI reveals small acute infarct in bilateral cerebellum, R occipital cortex, and R pon; small remote Bil cerebellar infarcts, L remote L MCA branch infarct. MRA revealed no flow through R vertebral artery. PMH includes: CVA with residual R sided deficits and chronic expressive aphasia, afib on xarelto, rectal/prostate cancer, CKD< CAD s/p CABG 2005, CHF, HTN, DM 2.    PT Comments    Patient progressing towards physical therapy goals. Patient ambulated 250' with no AD and minA for balance. Patient performed repeated sit to stands with supervision. Min guard-minA required for dynamic reaching tasks and standing marching due to R LE weakness and balance deficits. Continue to recommend comprehensive inpatient rehab (CIR) for post-acute therapy needs.     Follow Up Recommendations  CIR     Equipment Recommendations  None recommended by PT    Recommendations for Other Services       Precautions / Restrictions Precautions Precautions: Fall Precaution Comments: Residual R side deficits from prior stroke Restrictions Weight Bearing Restrictions: No    Mobility  Bed Mobility Overal bed mobility: Needs Assistance Bed Mobility: Supine to Sit     Supine to sit: Supervision     General bed mobility comments: supervision for safety. Use of momentum for trunk elevation    Transfers Overall transfer level: Needs assistance Equipment used: None Transfers: Sit to/from Stand Sit to Stand: Supervision         General transfer comment: supervision for safety. repeated sit to stand x 10 from recliner with use of momentum to rise  Ambulation/Gait Ambulation/Gait assistance: Min assist Gait Distance (Feet): 250 Feet Assistive device: None Gait Pattern/deviations: Step-through  pattern;Decreased stride length;Wide base of support;Decreased weight shift to right Gait velocity: decreased   General Gait Details: minA for balance due to unsteadiness with no AD. Decreased weight shift to R during ambulation with short shuffle steps at times.Cues for forward gaze during ambulation due to patient tending to look at ground   Stairs             Wheelchair Mobility    Modified Rankin (Stroke Patients Only) Modified Rankin (Stroke Patients Only) Pre-Morbid Rankin Score: Moderate disability Modified Rankin: Moderately severe disability     Balance Overall balance assessment: Needs assistance Sitting-balance support: Feet supported;No upper extremity supported Sitting balance-Leahy Scale: Good     Standing balance support: No upper extremity supported;During functional activity Standing balance-Leahy Scale: Fair Standing balance comment: min guard for safety during dynamic reaching activities and standing marching. Hesitant to bear full weight through R LE due to weakness                            Cognition Arousal/Alertness: Awake/alert Behavior During Therapy: WFL for tasks assessed/performed Overall Cognitive Status: Difficult to assess                                 General Comments: Difficult to understand due to chronic aphasia but following all commands      Exercises General Exercises - Lower Extremity Hip Flexion/Marching: 10 reps;Standing Other Exercises Other Exercises: sit to stand x 10 with close supervision. Use of momentum to rise Other Exercises: dynamic reaching tasks with R and L UE.  Difficulty reaching with R UE due to weakness    General Comments        Pertinent Vitals/Pain Pain Assessment: No/denies pain    Home Living                      Prior Function            PT Goals (current goals can now be found in the care plan section) Acute Rehab PT Goals Patient Stated Goal: Return  home PT Goal Formulation: With patient Time For Goal Achievement: 06/14/20 Potential to Achieve Goals: Good Progress towards PT goals: Progressing toward goals    Frequency    Min 4X/week      PT Plan Current plan remains appropriate    Co-evaluation              AM-PAC PT "6 Clicks" Mobility   Outcome Measure  Help needed turning from your back to your side while in a flat bed without using bedrails?: A Little Help needed moving from lying on your back to sitting on the side of a flat bed without using bedrails?: A Little Help needed moving to and from a bed to a chair (including a wheelchair)?: A Little Help needed standing up from a chair using your arms (e.g., wheelchair or bedside chair)?: A Little Help needed to walk in hospital room?: A Little Help needed climbing 3-5 steps with a railing? : A Little 6 Click Score: 18    End of Session Equipment Utilized During Treatment: Gait belt Activity Tolerance: Patient tolerated treatment well Patient left: in chair;with call bell/phone within reach;with chair alarm set Nurse Communication: Mobility status PT Visit Diagnosis: Unsteadiness on feet (R26.81);Difficulty in walking, not elsewhere classified (R26.2);Other symptoms and signs involving the nervous system (O17.711)     Time: 6579-0383 PT Time Calculation (min) (ACUTE ONLY): 24 min  Charges:  $Gait Training: 8-22 mins $Therapeutic Exercise: 8-22 mins                     Faithlynn Deeley A. Gilford Rile PT, DPT Acute Rehabilitation Services Pager [phone] Office [phone]    Linna Hoff 06/02/2020, 9:57 AM

## 2020-06-03 ENCOUNTER — Inpatient Hospital Stay (HOSPITAL_COMMUNITY)
Admission: RE | Admit: 2020-06-03 | Discharge: 2020-06-10 | DRG: 057 | Disposition: A | Discharge status: Home Health | Payer: Medicare Other | Source: Intra-hospital | Attending: Physical Medicine & Rehabilitation | Admitting: Physical Medicine & Rehabilitation

## 2020-06-03 ENCOUNTER — Other Ambulatory Visit: Payer: Self-pay

## 2020-06-03 ENCOUNTER — Encounter (HOSPITAL_COMMUNITY): Payer: Self-pay | Admitting: Physical Medicine & Rehabilitation

## 2020-06-03 DIAGNOSIS — I69351 Hemiplegia and hemiparesis following cerebral infarction affecting right dominant side: Secondary | ICD-10-CM | POA: Diagnosis present

## 2020-06-03 DIAGNOSIS — R471 Dysarthria and anarthria: Secondary | ICD-10-CM | POA: Diagnosis not present

## 2020-06-03 DIAGNOSIS — H532 Diplopia: Secondary | ICD-10-CM | POA: Diagnosis present

## 2020-06-03 DIAGNOSIS — Z7984 Long term (current) use of oral hypoglycemic drugs: Secondary | ICD-10-CM

## 2020-06-03 DIAGNOSIS — I639 Cerebral infarction, unspecified: Secondary | ICD-10-CM | POA: Diagnosis not present

## 2020-06-03 DIAGNOSIS — E0590 Thyrotoxicosis, unspecified without thyrotoxic crisis or storm: Secondary | ICD-10-CM | POA: Diagnosis present

## 2020-06-03 DIAGNOSIS — N1832 Chronic kidney disease, stage 3b: Secondary | ICD-10-CM | POA: Diagnosis present

## 2020-06-03 DIAGNOSIS — D631 Anemia in chronic kidney disease: Secondary | ICD-10-CM | POA: Diagnosis present

## 2020-06-03 DIAGNOSIS — I69398 Other sequelae of cerebral infarction: Secondary | ICD-10-CM | POA: Diagnosis not present

## 2020-06-03 DIAGNOSIS — K219 Gastro-esophageal reflux disease without esophagitis: Secondary | ICD-10-CM | POA: Diagnosis present

## 2020-06-03 DIAGNOSIS — I69390 Apraxia following cerebral infarction: Secondary | ICD-10-CM

## 2020-06-03 DIAGNOSIS — E1122 Type 2 diabetes mellitus with diabetic chronic kidney disease: Secondary | ICD-10-CM | POA: Diagnosis present

## 2020-06-03 DIAGNOSIS — R531 Weakness: Secondary | ICD-10-CM

## 2020-06-03 DIAGNOSIS — L89152 Pressure ulcer of sacral region, stage 2: Secondary | ICD-10-CM | POA: Diagnosis present

## 2020-06-03 DIAGNOSIS — Z951 Presence of aortocoronary bypass graft: Secondary | ICD-10-CM | POA: Diagnosis not present

## 2020-06-03 DIAGNOSIS — I69322 Dysarthria following cerebral infarction: Secondary | ICD-10-CM | POA: Diagnosis not present

## 2020-06-03 DIAGNOSIS — R2689 Other abnormalities of gait and mobility: Secondary | ICD-10-CM | POA: Diagnosis present

## 2020-06-03 DIAGNOSIS — R1312 Dysphagia, oropharyngeal phase: Secondary | ICD-10-CM | POA: Diagnosis present

## 2020-06-03 DIAGNOSIS — Z79899 Other long term (current) drug therapy: Secondary | ICD-10-CM

## 2020-06-03 DIAGNOSIS — I69391 Dysphagia following cerebral infarction: Secondary | ICD-10-CM

## 2020-06-03 DIAGNOSIS — K5900 Constipation, unspecified: Secondary | ICD-10-CM | POA: Diagnosis present

## 2020-06-03 DIAGNOSIS — M898X9 Other specified disorders of bone, unspecified site: Secondary | ICD-10-CM | POA: Diagnosis present

## 2020-06-03 DIAGNOSIS — I130 Hypertensive heart and chronic kidney disease with heart failure and stage 1 through stage 4 chronic kidney disease, or unspecified chronic kidney disease: Secondary | ICD-10-CM | POA: Diagnosis present

## 2020-06-03 DIAGNOSIS — G8191 Hemiplegia, unspecified affecting right dominant side: Secondary | ICD-10-CM

## 2020-06-03 DIAGNOSIS — I69320 Aphasia following cerebral infarction: Secondary | ICD-10-CM

## 2020-06-03 DIAGNOSIS — E1165 Type 2 diabetes mellitus with hyperglycemia: Secondary | ICD-10-CM | POA: Diagnosis not present

## 2020-06-03 DIAGNOSIS — E119 Type 2 diabetes mellitus without complications: Secondary | ICD-10-CM

## 2020-06-03 DIAGNOSIS — Z8249 Family history of ischemic heart disease and other diseases of the circulatory system: Secondary | ICD-10-CM

## 2020-06-03 DIAGNOSIS — I482 Chronic atrial fibrillation, unspecified: Secondary | ICD-10-CM | POA: Diagnosis present

## 2020-06-03 DIAGNOSIS — Z833 Family history of diabetes mellitus: Secondary | ICD-10-CM

## 2020-06-03 DIAGNOSIS — Z8546 Personal history of malignant neoplasm of prostate: Secondary | ICD-10-CM | POA: Diagnosis not present

## 2020-06-03 DIAGNOSIS — I2510 Atherosclerotic heart disease of native coronary artery without angina pectoris: Secondary | ICD-10-CM | POA: Diagnosis present

## 2020-06-03 DIAGNOSIS — I69319 Unspecified symptoms and signs involving cognitive functions following cerebral infarction: Secondary | ICD-10-CM

## 2020-06-03 DIAGNOSIS — I5042 Chronic combined systolic (congestive) and diastolic (congestive) heart failure: Secondary | ICD-10-CM

## 2020-06-03 DIAGNOSIS — Z87891 Personal history of nicotine dependence: Secondary | ICD-10-CM

## 2020-06-03 DIAGNOSIS — Z933 Colostomy status: Secondary | ICD-10-CM | POA: Diagnosis not present

## 2020-06-03 DIAGNOSIS — I5022 Chronic systolic (congestive) heart failure: Secondary | ICD-10-CM | POA: Diagnosis present

## 2020-06-03 DIAGNOSIS — Z7901 Long term (current) use of anticoagulants: Secondary | ICD-10-CM

## 2020-06-03 DIAGNOSIS — I252 Old myocardial infarction: Secondary | ICD-10-CM

## 2020-06-03 DIAGNOSIS — E785 Hyperlipidemia, unspecified: Secondary | ICD-10-CM | POA: Diagnosis present

## 2020-06-03 DIAGNOSIS — N189 Chronic kidney disease, unspecified: Secondary | ICD-10-CM

## 2020-06-03 DIAGNOSIS — Z794 Long term (current) use of insulin: Secondary | ICD-10-CM

## 2020-06-03 DIAGNOSIS — Z823 Family history of stroke: Secondary | ICD-10-CM

## 2020-06-03 LAB — BASIC METABOLIC PANEL
Anion gap: 5 (ref 5–15)
BUN: 32 mg/dL — ABNORMAL HIGH (ref 8–23)
CO2: 24 mmol/L (ref 22–32)
Calcium: 9.4 mg/dL (ref 8.9–10.3)
Chloride: 107 mmol/L (ref 98–111)
Creatinine, Ser: 1.97 mg/dL — ABNORMAL HIGH (ref 0.61–1.24)
GFR, Estimated: 32 mL/min — ABNORMAL LOW (ref >60)
Glucose, Bld: 146 mg/dL — ABNORMAL HIGH (ref 70–99)
Potassium: 4.3 mmol/L (ref 3.5–5.1)
Sodium: 136 mmol/L (ref 135–145)

## 2020-06-03 LAB — GLUCOSE, CAPILLARY
Glucose-Capillary: 138 mg/dL — ABNORMAL HIGH (ref 70–99)
Glucose-Capillary: 218 mg/dL — ABNORMAL HIGH (ref 70–99)
Glucose-Capillary: 165 mg/dL — ABNORMAL HIGH (ref 70–99)
Glucose-Capillary: 122 mg/dL — ABNORMAL HIGH (ref 70–99)
Glucose-Capillary: 181 mg/dL — ABNORMAL HIGH (ref 70–99)

## 2020-06-03 LAB — CBC
HCT: 43.2 % (ref 39.0–52.0)
Hemoglobin: 13.8 g/dL (ref 13.0–17.0)
MCH: 28.1 pg (ref 26.0–34.0)
MCHC: 31.9 g/dL (ref 30.0–36.0)
MCV: 88 fL (ref 80.0–100.0)
Platelets: 197 10*3/uL (ref 150–400)
RBC: 4.91 MIL/uL (ref 4.22–5.81)
RDW: 14.2 % (ref 11.5–15.5)
WBC: 9.9 10*3/uL (ref 4.0–10.5)
nRBC: 0 % (ref 0.0–0.2)

## 2020-06-03 MED ORDER — FLEET ENEMA 7-19 GM/118ML RE ENEM: 1.0000 | ENEMA | Freq: Once | RECTAL | Status: DC | PRN

## 2020-06-03 MED ORDER — ACETAMINOPHEN 325 MG PO TABS: 325.0000 mg | ORAL_TABLET | ORAL | Status: DC | PRN

## 2020-06-03 MED ORDER — PROCHLORPERAZINE EDISYLATE 10 MG/2ML IJ SOLN: 5.0000 mg | Freq: Four times a day (QID) | INTRAMUSCULAR | Status: DC | PRN

## 2020-06-03 MED ORDER — ATORVASTATIN CALCIUM 10 MG PO TABS
10.0000 mg | ORAL_TABLET | Freq: Every day | ORAL | Status: DC
  Administered 2020-06-04 – 2020-06-10 (×7): 10 mg via ORAL
  Filled 2020-06-03 (×7): qty 1

## 2020-06-03 MED ORDER — INSULIN ASPART 100 UNIT/ML IJ SOLN
0.0000 [IU] | Freq: Every day | INTRAMUSCULAR | Status: DC
  Administered 2020-06-08: 2 [IU] via SUBCUTANEOUS

## 2020-06-03 MED ORDER — TRAZODONE HCL 50 MG PO TABS: 25.0000 mg | ORAL_TABLET | Freq: Every evening | ORAL | Status: DC | PRN

## 2020-06-03 MED ORDER — PROCHLORPERAZINE MALEATE 5 MG PO TABS: 5.0000 mg | ORAL_TABLET | Freq: Four times a day (QID) | ORAL | Status: DC | PRN

## 2020-06-03 MED ORDER — DIPHENHYDRAMINE HCL 12.5 MG/5ML PO ELIX: 12.5000 mg | ORAL_SOLUTION | Freq: Four times a day (QID) | ORAL | Status: DC | PRN

## 2020-06-03 MED ORDER — DAPAGLIFLOZIN PROPANEDIOL 10 MG PO TABS: 10.0000 mg | ORAL_TABLET | Freq: Every day | ORAL | Status: DC

## 2020-06-03 MED ORDER — ASPIRIN EC 81 MG PO TBEC
81.0000 mg | DELAYED_RELEASE_TABLET | Freq: Every day | ORAL | Status: DC
  Administered 2020-06-04 – 2020-06-10 (×7): 81 mg via ORAL
  Filled 2020-06-03 (×7): qty 1

## 2020-06-03 MED ORDER — APIXABAN 2.5 MG PO TABS
2.5000 mg | ORAL_TABLET | Freq: Two times a day (BID) | ORAL | Status: DC
  Administered 2020-06-03 – 2020-06-10 (×14): 2.5 mg via ORAL
  Filled 2020-06-03 (×14): qty 1

## 2020-06-03 MED ORDER — GUAIFENESIN-DM 100-10 MG/5ML PO SYRP
5.0000 mL | ORAL_SOLUTION | Freq: Four times a day (QID) | ORAL | Status: DC | PRN
Start: 2020-06-03 — End: 2020-06-10

## 2020-06-03 MED ORDER — CALCIUM CITRATE 950 (200 CA) MG PO TABS
200.0000 mg | ORAL_TABLET | Freq: Every day | ORAL | Status: DC
  Administered 2020-06-03 – 2020-06-10 (×8): 200 mg via ORAL
  Filled 2020-06-03 (×10): qty 1

## 2020-06-03 MED ORDER — ALUM & MAG HYDROXIDE-SIMETH 200-200-20 MG/5ML PO SUSP: 10.0000 mL | Freq: Four times a day (QID) | ORAL | Status: DC | PRN

## 2020-06-03 MED ORDER — SENNOSIDES-DOCUSATE SODIUM 8.6-50 MG PO TABS
1.0000 | ORAL_TABLET | Freq: Every day | ORAL | Status: DC
  Administered 2020-06-05 – 2020-06-09 (×4): 1 via ORAL
  Filled 2020-06-03 (×5): qty 1

## 2020-06-03 MED ORDER — BISACODYL 10 MG RE SUPP
10.0000 mg | Freq: Every day | RECTAL | Status: DC | PRN
  Filled 2020-06-03: qty 1

## 2020-06-03 MED ORDER — INSULIN ASPART 100 UNIT/ML IJ SOLN
0.0000 [IU] | Freq: Three times a day (TID) | INTRAMUSCULAR | Status: DC
  Administered 2020-06-04: 3 [IU] via SUBCUTANEOUS
  Administered 2020-06-04: 2 [IU] via SUBCUTANEOUS
  Administered 2020-06-04: 3 [IU] via SUBCUTANEOUS
  Administered 2020-06-05 (×2): 2 [IU] via SUBCUTANEOUS
  Administered 2020-06-05 – 2020-06-06 (×2): 3 [IU] via SUBCUTANEOUS
  Administered 2020-06-06 – 2020-06-07 (×2): 2 [IU] via SUBCUTANEOUS
  Administered 2020-06-07: 3 [IU] via SUBCUTANEOUS
  Administered 2020-06-08 (×2): 2 [IU] via SUBCUTANEOUS
  Administered 2020-06-08 – 2020-06-09 (×2): 3 [IU] via SUBCUTANEOUS
  Administered 2020-06-09: 2 [IU] via SUBCUTANEOUS

## 2020-06-03 MED ORDER — PROCHLORPERAZINE 25 MG RE SUPP: 12.5000 mg | Freq: Four times a day (QID) | RECTAL | Status: DC | PRN

## 2020-06-03 MED ORDER — AMIODARONE HCL 100 MG PO TABS: 100.0000 mg | ORAL_TABLET | Freq: Every day | ORAL | 0 refills | Status: DC

## 2020-06-03 MED ORDER — AMIODARONE HCL 200 MG PO TABS
100.0000 mg | ORAL_TABLET | Freq: Every day | ORAL | Status: DC
  Administered 2020-06-04 – 2020-06-10 (×7): 100 mg via ORAL
  Filled 2020-06-03 (×7): qty 1

## 2020-06-03 MED ORDER — SIMETHICONE 80 MG PO CHEW: 80.0000 mg | CHEWABLE_TABLET | Freq: Four times a day (QID) | ORAL | Status: DC | PRN

## 2020-06-03 MED ORDER — POLYETHYLENE GLYCOL 3350 17 G PO PACK: 17.0000 g | PACK | Freq: Every day | ORAL | Status: DC | PRN

## 2020-06-03 MED ORDER — PROSOURCE PLUS PO LIQD
30.0000 mL | Freq: Two times a day (BID) | ORAL | Status: DC
  Administered 2020-06-04 – 2020-06-09 (×12): 30 mL via ORAL
  Filled 2020-06-03 (×8): qty 30

## 2020-06-03 MED ORDER — APIXABAN 2.5 MG PO TABS: 2.5000 mg | ORAL_TABLET | Freq: Two times a day (BID) | ORAL | 2 refills | Status: DC

## 2020-06-03 NOTE — Care Management Important Message (Signed)
Important Message  Patient Details  Name: Jeffery Roach MRN: [id number] Date of Birth: 04-Jul-1927   Medicare Important Message Given:  Yes     Alayziah Tangeman 06/03/2020, 2:01 PM

## 2020-06-03 NOTE — Progress Notes (Signed)
Show:Clear all [x] Manual[x] Template[x] Copied  Added by: [x] Cristina Gong, RN   [] Hover for details  PMR Admission Coordinator Pre-Admission Assessment   Patient: Jeffery Roach is an 87 y.o., male MRN: [id number] DOB: September 19, 1927 Height: 6' (182.9 cm) Weight: 104.6 kg                                                                                                                                                  Insurance Information HMO:     PPO:      PCP:      IPA:      80/20:      OTHER:  PRIMARY: Medicare a and b      Policy#: [id number]      Subscriber: pt Benefits:  Phone #: passport one online     Name: 6/1 Eff. Date: 08/03/1994     Deduct: $1556      Out of Pocket Max: none      Life Max: none  CIR: 100%      SNF: 20 full days Outpatient: 80%     Co-Pay: 20% Home Health: 1005      Co-Pay: none DME: 80%     Co-Pay: 20% Providers: pt choice  SECONDARY: AARP supplement      Policy#: [id number]        Financial Counselor:       Phone#:    The "Data Collection Information Summary" for patients in Inpatient Rehabilitation Facilities with attached "Privacy Act [place] Records" was provided and verbally reviewed with: Patient and Family   Emergency Contact Information         Contact Information     Name Relation Home Work [place], [place]    Imri, Lor Son     [phone]    Synthia Innocent Daughter     [phone]       Current Medical History  Patient Admitting Diagnosis: CVA   History of Present Illness: 87 y.o. 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 He was found to have small acute infarct in bilateral cerebellum, right occipital cortex and possibly right pons with no flow throughout most of R-VA with distal V4 segment  reconstitution and remover large L-MCA infarct.  2 D echo showed EF 40-45% with global hypokinesis and severely elevated PAH. Stroke felt to embolic in setting of CAF and he continues on low dose ASA/Xarelto.  Dr. Terrence Dupont consulted for input on A fib with RVR and occasional NSVT. He recommended addition of metoprolol for rate control as well as Entresto for depressed cardiac function. Therapy evaluations completed revealing severe dysarthria with mild oropharyngeal  dysphagia--started on DII, thin liquids due to mild aspiration risk, balance deficits and diplopia affecting functional mobility and ADLs.   Past Medical History      Past Medical History:  Diagnosis Date  . Acute on chronic systolic (congestive) heart failure ([place])    . Anemia due to stage 3 chronic kidney disease ([place])    . Arteriosclerotic coronary artery disease    . Atrial fibrillation ([place])    . Benign hypertension with chronic kidney disease, stage III ([place])    . Cerebral artery occlusion with cerebral infarction ([place])    . Cortical age-related cataract of both eyes    . Dermatochalasis of both upper eyelids    . Diabetes mellitus with stage 3 chronic kidney disease ([place])    . Gastroesophageal reflux disease without esophagitis    . History of heart attack    . Hypercalcemia    . Hyperlipidemia    . Hyperopia with astigmatism and presbyopia, bilateral    . Hypertension with goal to be determined    . Hyperthyroidism    . Hypokalemia    . LV dysfunction    . Malignant neoplasm of prostate ([place])    . Metabolic bone disease    . Myocardial infarction ([place])    . Nuclear sclerotic cataract of both eyes    . Personal history of rectal cancer    . Toxic multinodular goiter    . Toxic multinodular goiter    . Transient ischemic attack    . Type 2 diabetes mellitus with hyperglycemia ([place])    . Vitamin D deficiency    . Vitreous floaters of both eyes      Family History  family history includes Diabetes in his sister;  Hypertension in his sister; Stroke in his sister.   Prior Rehab/Hospitalizations:  Has the patient had prior rehab or hospitalizations prior to admission? Yes   Has the patient had major surgery during 100 days prior to admission? No   Current Medications    Current Facility-Administered Medications:  .  acetaminophen (TYLENOL) tablet 650 mg, 650 mg, Oral, Q4H PRN **OR** acetaminophen (TYLENOL) 160 MG/5ML solution 650 mg, 650 mg, Per Tube, Q4H PRN **OR** acetaminophen (TYLENOL) suppository 650 mg, 650 mg, Rectal, Q4H PRN, Aslam, Sadia, MD .  amiodarone (PACERONE) tablet 100 mg, 100 mg, Oral, Daily, Harwani, Mohan, MD, 100 mg at 06/03/20 1001 .  apixaban (ELIQUIS) tablet 2.5 mg, 2.5 mg, Oral, BID, Amedeo Plenty, RPH, 2.5 mg at 06/03/20 1001 .  aspirin EC tablet 81 mg, 81 mg, Oral, Daily, Aslam, Sadia, MD, 81 mg at 06/03/20 1000 .  atorvastatin (LIPITOR) tablet 10 mg, 10 mg, Oral, Daily, Sanjuan Dame, MD, 10 mg at 06/03/20 1001 .  chlorhexidine (PERIDEX) 0.12 % solution 15 mL, 15 mL, Mouth Rinse, BID, Sanjuan Dame, MD, 15 mL at 06/03/20 1001 .  insulin aspart (novoLOG) injection 0-15 Units, 0-15 Units, Subcutaneous, TID WC, Sanjuan Dame, MD, 2 Units at 06/03/20 0700 .  insulin aspart (novoLOG) injection 0-5 Units, 0-5 Units, Subcutaneous, QHS, Sanjuan Dame, MD .  MEDLINE mouth rinse, 15 mL, Mouth Rinse, q12n4p, Sanjuan Dame, MD, 15 mL at 06/02/20 1630 .  sodium chloride flush (NS) 0.9 % injection 3 mL, 3 mL, Intravenous, Once, Horton, Kristie M, DO   Patients Current Diet:     Diet Order                      Diet - low sodium heart healthy  DIET DYS 3 Room service appropriate? Yes; Fluid consistency: Thin  Diet effective now                      Precautions / Restrictions Precautions Precautions: Fall Precaution Comments: Residual R side deficits from prior stroke Restrictions Weight Bearing Restrictions: No    Has the patient had 2 or  more falls or a fall with injury in the past year?No   Prior Activity Level Community (5-7x/wk): Mod I with ambulation and adls; did not drive   Prior Functional Level Prior Function Level of Independence: Independent Comments: independent with ADLs, mobility; spouse completes IADLs and med mgmt   Self Care: Did the patient need help bathing, dressing, using the toilet or eating?  Independent   Indoor Mobility: Did the patient need assistance with walking from room to room (with or without device)? Independent   Stairs: Did the patient need assistance with internal or external stairs (with or without device)? Independent   Functional Cognition: Did the patient need help planning regular tasks such as shopping or remembering to take medications? Independent   Home Assistive Devices / Equipment Home Equipment: Cane - single point   Prior Device Use: Indicate devices/aids used by the patient prior to current illness, exacerbation or injury? None of the above   Current Functional Level Cognition   Overall Cognitive Status: Difficult to assess Difficult to assess due to: Impaired communication Orientation Level: Oriented X4 General Comments: Difficult to understand due to chronic aphasia but following all commands Attention: Sustained Sustained Attention: Appears intact Awareness: Impaired Awareness Impairment: Emergent impairment Safety/Judgment: Appears intact    Extremity Assessment (includes Sensation/Coordination)   Upper Extremity Assessment: RUE deficits/detail RUE Deficits / Details: baseline weakness, decreased functional use from chronic CVA with limited shoulder ROM and decreased coordination RUE Coordination: decreased fine motor,decreased gross motor  Lower Extremity Assessment: Defer to PT evaluation RLE Deficits / Details: Bilaterally, 5/5 strength in quads, hamstrings, and ankle dorsiflexors. 4+/5 strength in B hip flexors. RLE Sensation: decreased light touch      ADLs   Overall ADL's : Needs assistance/impaired Grooming: Wash/dry hands,Wash/dry face,Standing,Minimal assistance Grooming Details (indicate cue type and reason): VC's to incorporate R hand and arm with functional tasks, even as stabilize assist. Upper Body Dressing : Sitting,Set up Upper Body Dressing Details (indicate cue type and reason): to down back gown Lower Body Dressing: Minimal assistance,Sit to/from stand Lower Body Dressing Details (indicate cue type and reason): able to manage socks, min assist in standing Toilet Transfer: Minimal assistance,Ambulation Toilet Transfer Details (indicate cue type and reason): no AD, simulated Functional mobility during ADLs: Minimal assistance,Cueing for safety General ADL Comments: pt limtied by balance, activity tolerance     Mobility   Overal bed mobility: Needs Assistance Bed Mobility: Supine to Sit Supine to sit: Supervision General bed mobility comments: supervision for safety. Use of momentum for trunk elevation     Transfers   Overall transfer level: Needs assistance Equipment used: None Transfers: Sit to/from Stand Sit to Stand: Supervision General transfer comment: supervision for safety. repeated sit to stand x 10 from recliner with use of momentum to rise     Ambulation / Gait / Stairs / Emergency planning/management officer   Ambulation/Gait Ambulation/Gait assistance: Herbalist (Feet): 250 Feet Assistive device: None Gait Pattern/deviations: Step-through pattern,Decreased stride length,Wide base of support,Decreased weight shift to right General Gait Details: minA for balance due to unsteadiness with no AD. Decreased weight shift to R during  ambulation with short shuffle steps at times.Cues for forward gaze during ambulation due to patient tending to look at ground Gait velocity: decreased Gait velocity interpretation: <1.8 ft/sec, indicate of risk for recurrent falls     Posture / Balance Dynamic Sitting  Balance Sitting balance - Comments: dynamically able to managed socks, but posterior lean during LB MMT. Balance Overall balance assessment: Needs assistance Sitting-balance support: Feet supported,No upper extremity supported Sitting balance-Leahy Scale: Good Sitting balance - Comments: dynamically able to managed socks, but posterior lean during LB MMT. Standing balance support: No upper extremity supported,During functional activity Standing balance-Leahy Scale: Fair Standing balance comment: min guard for activities at the sink in stand.     Special needs/care consideration Colostomy  Pta Patient loves to play poker and checkers    Previous Home Environment  Living Arrangements: Spouse/significant other  Lives With: Spouse Available Help at Discharge: Family,Available 24 hours/day Type of Home: House Home Layout: One level Home Access: Stairs to enter [employer] of Steps: 2 Bathroom Shower/Tub: Multimedia programmer: Standard Bathroom Accessibility: Yes How Accessible: Accessible via walker [place]: No Additional Comments: Patient loves to play checkers and poker   Discharge Living Setting Plans for Discharge Living Setting: Patient's home,Lives with (comment) (wife of 52 years) Type of Home at Discharge: House Discharge Home Layout: One level Discharge Home Access: Stairs to enter Entrance Stairs-Rails: None Entrance Stairs-Number of Steps: 2 Discharge Bathroom Shower/Tub: Horticulturist, commercial: Standard Discharge Bathroom Accessibility: Yes How Accessible: Accessible via walker Does the patient have any problems obtaining your medications?: No   Social/Family/Support Systems Patient Roles: Engineer, building services Information: wife and daughter, Baker Janus Anticipated Caregiver: wife and family Anticipated Caregiver's Contact Information: see above Ability/Limitations of Caregiver: Baker Janus works, wife is also of advanced  age Caregiver Availability: 24/7 Discharge Plan Discussed with Primary Caregiver: Yes Is Caregiver In Agreement with Plan?: Yes Does Caregiver/Family have Issues with Lodging/Transportation while Pt is in Rehab?: No   Goals Patient/Family Goal for Rehab: Mod I to supervision with PT, OT and SLP Expected length of stay: [facility] 5 to 7 days Pt/Family Agrees to Admission and willing to participate: Yes Program Orientation Provided & Reviewed with Pt/Caregiver Including Roles  & Responsibilities: Yes   Decrease burden of Care through IP rehab admission: n/a   Possible need for SNF placement upon discharge:not anticipated   Patient Condition: This patient's medical and functional status has changed since the consult dated 06/01/2020 in which the Rehabilitation Physician determined and documented that the patient was potentially appropriate for intensive rehabilitative care in an inpatient rehabilitation facility. Issues have been addressed and update has been discussed with Dr. Naaman Plummer and patient now appropriate for inpatient rehabilitation. Will admit to inpatient rehab today.    Preadmission Screen Completed By:  Cleatrice Burke, RN, 06/03/2020 10:19 AM ______________________________________________________________________   Discussed status with Dr. Dagoberto Ligas on 06/03/2020 at  51 and received approval for admission today.   Admission Coordinator:  Cleatrice Burke, time [id number] Date 06/03/2020             Cosigned by: Courtney Heys, MD at 06/03/2020 10:43 AM    Revision History                             Note Details  Author Cristina Gong, RN File Time 06/03/2020 10:22 AM  Author Type Rehab Admission Coordinator Status Signed  Last Editor Cristina Gong, RN Service  Physical Medicine and Rehabilitation

## 2020-06-03 NOTE — H&P (Signed)
Physical Medicine and Rehabilitation Admission H&P    Chief Complaint  Patient presents with  .  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  He was found to have small acute infarct in bilateral cerebellum, right occipital cortex and possibly right pons with no flow through most of the right-created distal V4 segment reconstitution and remote large-MCA infarct.  2D echo showed EF of 40 to 45% with global hypokinesis and severely elevated PAH.  Stroke was felt to be embolic in setting of chronic A. fib and DOAC was changed to renal dose Eliquis.  Hospital course significant for A. fib with RVR with occasional NSVT and he was started on beta-blocker however developed bradycardia with this therefore this was changed to amiodarone with good rate control.  Therapy has been ongoing and patient continues to be limited by severe dysarthria with mild oropharyngeal dysphagia, balance deficits, diplopia as well as worsening of right hemiplegia.  CIR was recommended due to functional decline.  Pt reports he still is having difficulty talking and making himself understood.  Also difficulty with swallowing secretions when talking.   Having BMs via colostomy but its harder.   Review of Systems  Constitutional: Negative for chills and fever.  HENT: Negative for hearing loss and tinnitus.   Eyes: Negative for blurred vision and double vision.  Respiratory: Positive for shortness of breath. Negative for cough.   Cardiovascular: Negative for chest pain and palpitations.  Gastrointestinal: Positive for constipation. Negative for heartburn and nausea.  Genitourinary: Negative for dysuria and urgency.  Musculoskeletal: Negative for myalgias and neck pain.  Skin:  Negative for rash.  Neurological: Positive for sensory change, speech change and weakness. Negative for dizziness and headaches.  Psychiatric/Behavioral: The patient does not have insomnia.   All other systems reviewed and are negative.   Past Medical History:  Diagnosis Date  . Acute on chronic systolic (congestive) heart failure ([place])   . Anemia due to stage 3 chronic kidney disease ([place])   . Arteriosclerotic coronary artery disease   . Atrial fibrillation ([place])   . Benign hypertension with chronic kidney disease, stage III ([place])   . Cerebral artery occlusion with cerebral infarction ([place])   . Cortical age-related cataract of both eyes   . Dermatochalasis of both upper eyelids   . Diabetes mellitus with stage 3 chronic kidney disease ([place])   . Gastroesophageal reflux disease without esophagitis   . History of heart attack   . Hypercalcemia   . Hyperlipidemia   . Hyperopia with astigmatism and presbyopia, bilateral   . Hypertension with goal to be determined   . Hyperthyroidism   . Hypokalemia   . LV dysfunction   . Malignant neoplasm of prostate ([place])   . Metabolic bone disease   . Myocardial infarction ([place])   . Nuclear sclerotic cataract of both eyes   . Personal history of rectal cancer   . Toxic multinodular goiter   . Toxic multinodular goiter   . Transient ischemic attack   . Type 2 diabetes mellitus with hyperglycemia ([place])   . Vitamin D deficiency   . Vitreous floaters of both eyes     Past Surgical History:  Procedure Laterality Date  . CARDIAC SURGERY    . COLOPROCTECTOMY    . CORONARY  ARTERY BYPASS GRAFT    . GASTROSTOMY TUBE PLACEMENT    . INGUINAL HERNIA REPAIR    . TONSILLECTOMY      Family History  Problem Relation Age of Onset  . Diabetes Sister   . Stroke Sister   . Hypertension Sister     Social History:  Married. Independent PTA. He reports that he has quit smoking. He has never used smokeless tobacco. No history on file for alcohol use and  drug use.    Allergies: No Known Allergies    Medications Prior to Admission  Medication Sig Dispense Refill  . amLODipine (NORVASC) 5 MG tablet Take 5 mg by mouth daily after breakfast.    . aspirin EC 81 MG tablet Take 81 mg by mouth daily after breakfast.    . atorvastatin (LIPITOR) 10 MG tablet Take 1 tablet (10 mg total) by mouth daily. (Patient taking differently: Take 10 mg by mouth daily after breakfast.) 30 tablet 0  . cholecalciferol (VITAMIN D) 25 MCG (1000 UT) tablet Take 1,000 Units by mouth daily after breakfast.    . dapagliflozin propanediol (FARXIGA) 10 MG TABS tablet Take 10 mg by mouth daily before breakfast.    . ferrous sulfate 325 (65 FE) MG tablet Take 1 tablet (325 mg total) by mouth daily with breakfast. (Patient taking differently: Take 325 mg by mouth daily after breakfast.) 30 tablet 0  . furosemide (LASIX) 40 MG tablet Take 40 mg by mouth daily after breakfast.    . glipiZIDE (GLUCOTROL XL) 10 MG 24 hr tablet Take 1 tablet (10 mg total) by mouth daily with breakfast. (Patient taking differently: Take 10 mg by mouth daily before breakfast.) 30 tablet 0  . pantoprazole (PROTONIX) 40 MG tablet Take 40 mg by mouth daily after breakfast.    . potassium chloride (KLOR-CON) 10 MEQ tablet Take 10 mEq by mouth daily before supper.    . rivaroxaban (XARELTO) 20 MG TABS tablet Take 1 tablet (20 mg total) by mouth daily with supper. (Patient taking differently: Take 20 mg by mouth daily after breakfast.) 30 tablet 0  . diltiazem (CARDIZEM) 120 MG tablet Take 1 tablet (120 mg total) by mouth daily. (Patient not taking: No sig reported) 30 tablet 0  . insulin lispro (HUMALOG) 100 UNIT/ML injection Inject 0.03-0.15 mLs (3-15 Units total) into the skin 3 (three) times daily with meals. SSI: 151-200: 3 units; 201-250: 5 units; 251-300: 8 units; 301-350: 11 units; 351-400: 15 units (Patient not taking: No sig reported) 10 mL 0  . metoprolol succinate (TOPROL-XL) 25 MG 24 hr tablet  Take 1 tablet (25 mg total) by mouth daily. (Patient not taking: No sig reported) 30 tablet 0    Drug Regimen Review  Drug regimen was reviewed and remains appropriate with no significant issues identified  Home: Home Living Family/patient expects to be discharged to:: Private residence Living Arrangements: Spouse/significant other Available Help at Discharge: Family,Available 24 hours/day Type of Home: House Home Access: Stairs to enter [employer] of Steps: 2 Home Layout: One level Bathroom Shower/Tub: Multimedia programmer: Standard Bathroom Accessibility: Yes Home Equipment: [place] - single point Additional Comments: Patient loves to play checkers and poker  Lives With: Spouse   Functional History: Prior Function Level of Independence: Independent Comments: independent with ADLs, mobility; spouse completes IADLs and med mgmt  Functional Status:  Mobility: Bed Mobility Overal bed mobility: Needs Assistance Bed Mobility: Supine to Sit Supine to sit: Supervision General bed mobility comments: supervision for safety. Use  of momentum for trunk elevation Transfers Overall transfer level: Needs assistance Equipment used: None Transfers: Sit to/from Stand Sit to Stand: Supervision General transfer comment: supervision for safety. repeated sit to stand x 10 from recliner with use of momentum to rise Ambulation/Gait Ambulation/Gait assistance: Min assist Gait Distance (Feet): 250 Feet Assistive device: None Gait Pattern/deviations: Step-through pattern,Decreased stride length,Wide base of support,Decreased weight shift to right General Gait Details: minA for balance due to unsteadiness with no AD. Decreased weight shift to R during ambulation with short shuffle steps at times.Cues for forward gaze during ambulation due to patient tending to look at ground Gait velocity: decreased Gait velocity interpretation: <1.8 ft/sec, indicate of risk for recurrent  falls    ADL: ADL Overall ADL's : Needs assistance/impaired Grooming: Wash/dry hands,Wash/dry face,Standing,Minimal assistance Grooming Details (indicate cue type and reason): VC's to incorporate R hand and arm with functional tasks, even as stabilize assist. Upper Body Dressing : Sitting,Set up Upper Body Dressing Details (indicate cue type and reason): to down back gown Lower Body Dressing: Minimal assistance,Sit to/from stand Lower Body Dressing Details (indicate cue type and reason): able to manage socks, min assist in standing Toilet Transfer: Minimal assistance,Ambulation Toilet Transfer Details (indicate cue type and reason): no AD, simulated Functional mobility during ADLs: Minimal assistance,Cueing for safety General ADL Comments: pt limtied by balance, activity tolerance  Cognition: Cognition Overall Cognitive Status: Difficult to assess Orientation Level: Oriented X4 Attention: Sustained Sustained Attention: Appears intact Awareness: Impaired Awareness Impairment: Emergent impairment Safety/Judgment: Appears intact Cognition Arousal/Alertness: Awake/alert Behavior During Therapy: WFL for tasks assessed/performed Overall Cognitive Status: Difficult to assess Area of Impairment: Problem solving,Awareness Awareness: Emergent Problem Solving: Slow processing,Requires verbal cues,Difficulty sequencing General Comments: Difficult to understand due to chronic aphasia but following all commands Difficult to assess due to: Impaired communication   Blood pressure (!) 130/96, pulse 77, temperature 98.8 F (37.1 C), temperature source Oral, resp. rate 16, height 6' (1.829 m), weight 104.6 kg, SpO2 96 %. Physical Exam Vitals and nursing note reviewed.  Constitutional:      Appearance: Normal appearance.     Comments: Up in chair with poor postural reflexes, slumped to the left and needed cues to maintain midline posture.  NAD.  Questioning cobbler on meal tray. Tall elderly  gentleman, looks much younger than age; sitting up in bedside chair, wearing shoes with PJs, NAD  HENT:     Head: Normocephalic.     Comments: Significant R facial droop and R tongue deviation Also missing some teeth    Right Ear: External ear normal.     Left Ear: External ear normal.     Nose: Nose normal. No congestion.     Mouth/Throat:     Mouth: Mucous membranes are moist.     Pharynx: Oropharynx is clear. No oropharyngeal exudate.  Eyes:     General:        Right eye: No discharge.        Left eye: No discharge.     Extraocular Movements: Extraocular movements intact.     Comments: Muddy sclerae  Cardiovascular:     Rate and Rhythm: Normal rate. Rhythm irregular.     Heart sounds: Normal heart sounds. No murmur heard. No gallop.   Pulmonary:     Comments: CTA B/L- no W/R/R- good air movement  Abdominal:     Comments: Colostomy with formed stool. Soft, NT, ND, (+)BS  colostomy -stoma pink  Musculoskeletal:     Cervical back: Normal range of motion  and neck supple.     Comments: RUE- 4/5 except Finger abd 2/5 LUE 5/5 RLE- 4+/5 in HF, KE, DF and PF LLE- 5/5  Skin:    Comments: L hand IV- looks OK No skin breakdown  Neurological:     Mental Status: He is alert and oriented to person, place, and time.     Comments: Expressive aphasia with moderate to severe dysarthria. Able to follow simple motor commands. Right hemiparesis with sensory deficits  Severe dysarthria complicated by some aphasia- word finding deficits.  Ox3  Psychiatric:     Comments: A little frustrated over communication     Results for orders placed or performed during the hospital encounter of 05/30/20 (from the past 48 hour(s))  Glucose, capillary     Status: Abnormal   Collection Time: 06/01/20 12:26 PM  Result Value Ref Range   Glucose-Capillary 144 (H) 70 - 99 mg/dL    Comment: Glucose reference range applies only to samples taken after fasting for at least 8 hours.  Glucose, capillary      Status: Abnormal   Collection Time: 06/01/20  4:19 PM  Result Value Ref Range   Glucose-Capillary 189 (H) 70 - 99 mg/dL    Comment: Glucose reference range applies only to samples taken after fasting for at least 8 hours.  Glucose, capillary     Status: Abnormal   Collection Time: 06/01/20  8:24 PM  Result Value Ref Range   Glucose-Capillary 146 (H) 70 - 99 mg/dL    Comment: Glucose reference range applies only to samples taken after fasting for at least 8 hours.  Glucose, capillary     Status: Abnormal   Collection Time: 06/01/20 11:46 PM  Result Value Ref Range   Glucose-Capillary 171 (H) 70 - 99 mg/dL    Comment: Glucose reference range applies only to samples taken after fasting for at least 8 hours.  CBC     Status: None   Collection Time: 06/02/20  2:42 AM  Result Value Ref Range   WBC 9.2 4.0 - 10.5 K/uL   RBC 5.11 4.22 - 5.81 MIL/uL   Hemoglobin 14.5 13.0 - 17.0 g/dL   HCT 44.8 39.0 - 52.0 %   MCV 87.7 80.0 - 100.0 fL   MCH 28.4 26.0 - 34.0 pg   MCHC 32.4 30.0 - 36.0 g/dL   RDW 14.3 11.5 - 15.5 %   Platelets 196 150 - 400 K/uL   nRBC 0.0 0.0 - 0.2 %    Comment: Performed at [facility], [place] [address] [place], [place] [address]  Basic metabolic panel     Status: Abnormal   Collection Time: 06/02/20  2:42 AM  Result Value Ref Range   Sodium 136 135 - 145 mmol/L   Potassium 3.8 3.5 - 5.1 mmol/L   Chloride 104 98 - 111 mmol/L   CO2 22 22 - 32 mmol/L   Glucose, Bld 142 (H) 70 - 99 mg/dL    Comment: Glucose reference range applies only to samples taken after fasting for at least 8 hours.   BUN 30 (H) 8 - 23 mg/dL   Creatinine, Ser 2.12 (H) 0.61 - 1.24 mg/dL   Calcium 9.4 8.9 - 10.3 mg/dL   GFR, Estimated 29 (L) >60 mL/min    Comment: (NOTE) Calculated using the CKD-EPI Creatinine Equation (2021)    Anion gap 10 5 - 15    Comment: Performed at [facility] [address] [place],  [address]  Glucose, capillary     Status: Abnormal   Collection  Time: 06/02/20  6:24 AM  Result Value Ref Range   Glucose-Capillary 137 (H) 70 - 99 mg/dL    Comment: Glucose reference range applies only to samples taken after fasting for at least 8 hours.  Glucose, capillary     Status: Abnormal   Collection Time: 06/02/20 12:07 PM  Result Value Ref Range   Glucose-Capillary 174 (H) 70 - 99 mg/dL    Comment: Glucose reference range applies only to samples taken after fasting for at least 8 hours.  Glucose, capillary     Status: Abnormal   Collection Time: 06/02/20  4:56 PM  Result Value Ref Range   Glucose-Capillary 150 (H) 70 - 99 mg/dL    Comment: Glucose reference range applies only to samples taken after fasting for at least 8 hours.  Glucose, capillary     Status: Abnormal   Collection Time: 06/02/20  9:15 PM  Result Value Ref Range   Glucose-Capillary 153 (H) 70 - 99 mg/dL    Comment: Glucose reference range applies only to samples taken after fasting for at least 8 hours.  CBC     Status: None   Collection Time: 06/03/20  3:06 AM  Result Value Ref Range   WBC 9.9 4.0 - 10.5 K/uL   RBC 4.91 4.22 - 5.81 MIL/uL   Hemoglobin 13.8 13.0 - 17.0 g/dL   HCT 43.2 39.0 - 52.0 %   MCV 88.0 80.0 - 100.0 fL   MCH 28.1 26.0 - 34.0 pg   MCHC 31.9 30.0 - 36.0 g/dL   RDW 14.2 11.5 - 15.5 %   Platelets 197 150 - 400 K/uL   nRBC 0.0 0.0 - 0.2 %    Comment: Performed at [facility], [place] [address] [place], [place] [address]  Basic metabolic panel     Status: Abnormal   Collection Time: 06/03/20  3:06 AM  Result Value Ref Range   Sodium 136 135 - 145 mmol/L   Potassium 4.3 3.5 - 5.1 mmol/L   Chloride 107 98 - 111 mmol/L   CO2 24 22 - 32 mmol/L   Glucose, Bld 146 (H) 70 - 99 mg/dL    Comment: Glucose reference range applies only to samples taken after fasting for at least 8 hours.   BUN 32 (H) 8 - 23 mg/dL   Creatinine, Ser 1.97 (H) 0.61 - 1.24 mg/dL   Calcium 9.4 8.9 - 10.3 mg/dL   GFR, Estimated 32 (L) >60 mL/min    Comment:  (NOTE) Calculated using the CKD-EPI Creatinine Equation (2021)    Anion gap 5 5 - 15    Comment: Performed at [facility] [address], [place], [place] [address]  Glucose, capillary     Status: Abnormal   Collection Time: 06/03/20  6:15 AM  Result Value Ref Range   Glucose-Capillary 138 (H) 70 - 99 mg/dL    Comment: Glucose reference range applies only to samples taken after fasting for at least 8 hours.   No results found.     Medical Problem List and Plan: 1.  R hemiparesis, dysarthria and aphasia secondary to small acute infarct in bilateral cerebellum, right occipital cortex and possibly right pons  -patient may  shower  -ELOS/Goals: 5-7 days mod I except will need Assist with SLP/communication 2.  Antithrombotics: -DVT/anticoagulation:  Pharmaceutical: Other (comment)--now on Eliquis renal dose  -antiplatelet therapy: low dose ASA 3. Pain Management: N/A  4. Mood: LCSW to follow for evaluation and support.   -antipsychotic agents: N/a 5. Neuropsych: This patient maybe  capable of making decisions on his own behalf. 6. Skin/Wound Care: Routine pressure relief measures  7. Fluids/Electrolytes/Nutrition: strict I/O. Check CMET in am.  8.  Chronic A fib: On amiodarone for NSVT/rate control (had bradycardia with BB)  Monitor HR TID and follow up with Dr. Terrence Dupont after d/c 9. T2DM: Will check Hgb A1C in am. Resume dapagligozin. Hold glipizide due to CKD.   --Monitor BS ac/hs and continue SSI for elevated BS.   --Carb modified/heart healthy restrictions added. 10. CKDIIIb: SCR 1.8 at admission-->2.12-->1.97 11. CAD s/p CABG/ICM with EF 40-45%: Monitor for signs of overload. Check weights daily.   --Was on dapaglifozin (resume for cardiac/DM), Lasix and atorvastatin PTA.  12. Hypocalcemia: Ionized Calcium 1.09. Add oral supplement.  13. Constipation: Will add 1 senna S at bedtime to help soften stools.     Bary Leriche, PA-C 06/03/2020   I have personally performed  a face to face diagnostic evaluation of this patient and formulated the key components of the plan.  Additionally, I have personally reviewed laboratory data, imaging studies, as well as relevant notes and concur with the physician assistant's documentation above.

## 2020-06-03 NOTE — Progress Notes (Signed)
Physical Therapy Treatment Patient Details Name: Jeffery Roach MRN: [id number] DOB: [birth year] Today's Date: 06/03/2020    History of Present Illness Pt is a 87 y/o male admitted 05/30/20 with slurred speech. MRI reveals small acute infarct in bilateral cerebellum, R occipital cortex, and R pon; small remote Bil cerebellar infarcts, L remote L MCA branch infarct. MRA revealed no flow through R vertebral artery. PMH includes: CVA with residual R sided deficits and chronic expressive aphasia, afib on xarelto, rectal/prostate cancer, CKD< CAD s/p CABG 2005, CHF, HTN, DM 2.    PT Comments    Patient progressing towards physical therapy goals this session. MinA required for mobility with increased gait speed due to balance deficits. Able to don shoes/socks seated EOB and reaching towards floor with no LOB. Patient continues to be limited by impaired communication and cognition. Continue to recommend comprehensive inpatient rehab (CIR) for post-acute therapy needs.    Follow Up Recommendations  CIR     Equipment Recommendations  None recommended by PT    Recommendations for Other Services       Precautions / Restrictions Precautions Precautions: Fall Precaution Comments: Residual R side deficits from prior stroke Restrictions Weight Bearing Restrictions: No    Mobility  Bed Mobility Overal bed mobility: Needs Assistance Bed Mobility: Supine to Sit     Supine to sit: Supervision;HOB elevated     General bed mobility comments: supervision for safety with HOB max elevated. Use of momentum for trunk elevation    Transfers Overall transfer level: Needs assistance Equipment used: None Transfers: Sit to/from Stand Sit to Stand: Supervision         General transfer comment: supervision for safety. Use of momentum to rise. Sit to stand x 4 throughout session  Ambulation/Gait Ambulation/Gait assistance: Min assist Gait Distance (Feet): 250 Feet Assistive device: None Gait  Pattern/deviations: Step-through pattern;Decreased stride length;Wide base of support;Decreased weight shift to right Gait velocity: decreased   General Gait Details: Decreased weightshift to R side and stance time on R. MinA for balance, especially during turns. Cues for forward gaze   Stairs             Wheelchair Mobility    Modified Rankin (Stroke Patients Only) Modified Rankin (Stroke Patients Only) Pre-Morbid Rankin Score: Moderate disability Modified Rankin: Moderately severe disability     Balance Overall balance assessment: Needs assistance Sitting-balance support: Feet supported;No upper extremity supported Sitting balance-Leahy Scale: Good Sitting balance - Comments: able to don socks/shoes seated EOB with min guard-supervision   Standing balance support: No upper extremity supported;During functional activity Standing balance-Leahy Scale: Fair Standing balance comment: minA during ambulation                            Cognition Arousal/Alertness: Awake/alert Behavior During Therapy: WFL for tasks assessed/performed Overall Cognitive Status: Difficult to assess Area of Impairment: Problem solving;Awareness                           Awareness: Emergent Problem Solving: Slow processing;Requires verbal cues;Difficulty sequencing General Comments: Difficult to understand due to chronic aphasia but following all commands      Exercises      General Comments        Pertinent Vitals/Pain Pain Assessment: No/denies pain    Home Living                      Prior Function  PT Goals (current goals can now be found in the care plan section) Acute Rehab PT Goals Patient Stated Goal: to get better PT Goal Formulation: With patient Time For Goal Achievement: 06/14/20 Potential to Achieve Goals: Good Progress towards PT goals: Progressing toward goals    Frequency    Min 4X/week      PT Plan Current  plan remains appropriate    Co-evaluation              AM-PAC PT "6 Clicks" Mobility   Outcome Measure  Help needed turning from your back to your side while in a flat bed without using bedrails?: A Little Help needed moving from lying on your back to sitting on the side of a flat bed without using bedrails?: A Little Help needed moving to and from a bed to a chair (including a wheelchair)?: A Little Help needed standing up from a chair using your arms (e.g., wheelchair or bedside chair)?: A Little Help needed to walk in hospital room?: A Little Help needed climbing 3-5 steps with a railing? : A Little 6 Click Score: 18    End of Session Equipment Utilized During Treatment: Gait belt Activity Tolerance: Patient tolerated treatment well Patient left: in chair;with call bell/phone within reach;with chair alarm set Nurse Communication: Mobility status PT Visit Diagnosis: Unsteadiness on feet (R26.81);Difficulty in walking, not elsewhere classified (R26.2);Other symptoms and signs involving the nervous system (R29.898)     Time: 2229-7989 PT Time Calculation (min) (ACUTE ONLY): 24 min  Charges:  $Gait Training: 8-22 mins $Therapeutic Activity: 8-22 mins                     Claramae Rigdon A. Gilford Rile PT, DPT Acute Rehabilitation Services Pager [phone] Office [phone]    Linna Hoff 06/03/2020, 11:31 AM

## 2020-06-03 NOTE — Progress Notes (Signed)
Jeffery Heys, MD  Physician  Physical Medicine and Rehabilitation  Consult Note      Signed  Date of Service:  06/01/2020 11:31 AM      Related encounter: ED to Hosp-Admission (Current) from 05/30/2020 in [facility]       Signed      Expand All Collapse All     Show:Clear all [x] Manual[x] Template[] Copied  Added by: [x] Love, Ivan Anchors, PA-C[x] Jeffery Heys, MD   [] Hover for details           Physical Medicine and Rehabilitation Consult   Reason for Consult:Stroke with functional decline.  Referring Physician: Dr. Philipp Ovens.      HPI: Jeffery Roach is a 87 y.o. 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 He was found to have small acute infarct in bilateral cerebellum, right occipital cortex and possibly right pons with no flow throughout most of R-VA with distal V4 segment reconstitution and remover large L-MCA infarct.  2 D echo showed EF 40-45% with global hypokinesis and severely elevated PAH. Stroke felt to embolic in setting of CAF and he continues on low dose ASA/Xarelto.  Dr. Terrence Dupont consulted for input on A fib with RVR and occasional NSVT. He recommended addition of metoprolol for rate control as well as Entresto for depressed cardiac function. Therapy evaluations completed revealing severe dysarthria with mild oropharyngeal dysphagia--started on DII, thin liquids due to mild aspiration risk, balance deficits and diplopia affecting functional mobility and ADLs. CIR was recommended due to functional decline.       Pt reports having severe dysarthria- very hard to understand him.  Denies pain; LBM via ostomy this AM- got ostomy changed this AM.  Peeing OK.  Coughing occ- pt says feels like something goes down the wrong way.      Review of Systems  Constitutional: Negative.    HENT: Positive for hearing loss.   Eyes: Negative.  Negative for pain and discharge.  Respiratory: Positive for cough. Negative for sputum production, shortness of breath and wheezing.   Cardiovascular: Negative.  Negative for chest pain and leg swelling.  Gastrointestinal: Negative for constipation, diarrhea and heartburn.  Genitourinary: Negative for dysuria, frequency and urgency.  Musculoskeletal: Negative.   Skin: Negative.   Neurological: Positive for speech change, focal weakness and weakness. Negative for headaches.  Endo/Heme/Allergies: Negative.   Psychiatric/Behavioral: Negative.   All other systems reviewed and are negative.           Past Medical History:  Diagnosis Date  . Acute on chronic systolic (congestive) heart failure ([place])    . Anemia due to stage 3 chronic kidney disease ([place])    . Arteriosclerotic coronary artery disease    . Atrial fibrillation ([place])    . Benign hypertension with chronic kidney disease, stage III ([place])    . Cerebral artery occlusion with cerebral infarction ([place])    . Cortical age-related cataract of both eyes    . Dermatochalasis of both upper eyelids    . Diabetes mellitus with stage 3 chronic kidney disease ([place])    . Gastroesophageal reflux disease without esophagitis    . History of heart attack    . Hypercalcemia    . Hyperlipidemia    . Hyperopia with astigmatism and presbyopia, bilateral    . Hypertension with goal to be determined    .  Hyperthyroidism    . Hypokalemia    . LV dysfunction    . Malignant neoplasm of prostate ([place])    . Metabolic bone disease    . Myocardial infarction ([place])    . Nuclear sclerotic cataract of both eyes    . Personal history of rectal cancer    . Toxic multinodular goiter    . Toxic multinodular goiter    . Transient ischemic attack    . Type 2 diabetes mellitus with hyperglycemia ([place])    . Vitamin D deficiency    . Vitreous floaters of both eyes           Past Surgical History:   Procedure Laterality Date  . CARDIAC SURGERY      . COLOPROCTECTOMY      . CORONARY ARTERY BYPASS GRAFT      . GASTROSTOMY TUBE PLACEMENT      . INGUINAL HERNIA REPAIR      . TONSILLECTOMY             Family History  Problem Relation Age of Onset  . Diabetes Sister    . Stroke Sister    . Hypertension Sister      Social History:  reports that he has quit smoking. He has never used smokeless tobacco. No history on file for alcohol use and drug use. Allergies: No Known Allergies       Medications Prior to Admission  Medication Sig Dispense Refill  . amLODipine (NORVASC) 5 MG tablet Take 5 mg by mouth daily after breakfast.      . aspirin EC 81 MG tablet Take 81 mg by mouth daily after breakfast.      . atorvastatin (LIPITOR) 10 MG tablet Take 1 tablet (10 mg total) by mouth daily. (Patient taking differently: Take 10 mg by mouth daily after breakfast.) 30 tablet 0  . cholecalciferol (VITAMIN D) 25 MCG (1000 UT) tablet Take 1,000 Units by mouth daily after breakfast.      . dapagliflozin propanediol (FARXIGA) 10 MG TABS tablet Take 10 mg by mouth daily before breakfast.      . ferrous sulfate 325 (65 FE) MG tablet Take 1 tablet (325 mg total) by mouth daily with breakfast. (Patient taking differently: Take 325 mg by mouth daily after breakfast.) 30 tablet 0  . furosemide (LASIX) 40 MG tablet Take 40 mg by mouth daily after breakfast.      . glipiZIDE (GLUCOTROL XL) 10 MG 24 hr tablet Take 1 tablet (10 mg total) by mouth daily with breakfast. (Patient taking differently: Take 10 mg by mouth daily before breakfast.) 30 tablet 0  . pantoprazole (PROTONIX) 40 MG tablet Take 40 mg by mouth daily after breakfast.      . potassium chloride (KLOR-CON) 10 MEQ tablet Take 10 mEq by mouth daily before supper.      . rivaroxaban (XARELTO) 20 MG TABS tablet Take 1 tablet (20 mg total) by mouth daily with supper. (Patient taking differently: Take 20 mg by mouth daily after breakfast.) 30 tablet 0  .  diltiazem (CARDIZEM) 120 MG tablet Take 1 tablet (120 mg total) by mouth daily. (Patient not taking: No sig reported) 30 tablet 0  . insulin lispro (HUMALOG) 100 UNIT/ML injection Inject 0.03-0.15 mLs (3-15 Units total) into the skin 3 (three) times daily with meals. SSI: 151-200: 3 units; 201-250: 5 units; 251-300: 8 units; 301-350: 11 units; 351-400: 15 units (Patient not taking: No sig reported) 10 mL 0  . metoprolol succinate (  TOPROL-XL) 25 MG 24 hr tablet Take 1 tablet (25 mg total) by mouth daily. (Patient not taking: No sig reported) 30 tablet 0      Home: Home Living Family/patient expects to be discharged to:: Private residence Living Arrangements: Spouse/significant other Available Help at Discharge: Family,Available 24 hours/day Type of Home: House Home Access: Stairs to enter [employer] of Steps: 2 Home Layout: One level Bathroom Shower/Tub: Multimedia programmer: [place]: [place] - single point  Functional History: Prior Function Level of Independence: Independent Comments: independent with ADLs, mobility; spouse completes IADLs and med mgmt Functional Status:  Mobility: Bed Mobility Overal bed mobility: Needs Assistance Bed Mobility: Supine to Sit Supine to sit: Min assist General bed mobility comments: Min assist due to posterior lean. Pt initiated trunk flexion and was able to scoot out fully to EOB with increased time. Transfers Overall transfer level: Needs assistance Equipment used: None Transfers: Sit to/from Stand Sit to Stand: Min guard General transfer comment: Hands-on guarding for safety as pt powered up to full stand. No overt LOB noted. Ambulation/Gait Ambulation/Gait assistance: Min guard,Min assist Gait Distance (Feet): 175 Feet Assistive device: 1 person hand held assist,None Gait Pattern/deviations: Step-through pattern,Decreased stride length,Antalgic,Trunk flexed General Gait Details: Initially without AD. Pt  mildly unsteady requiring intermittent assist to steady. With HHA, pt improved. Gait velocity: Decreased Gait velocity interpretation: <1.8 ft/sec, indicate of risk for recurrent falls   ADL: ADL Overall ADL's : Needs assistance/impaired Grooming: Minimal assistance,Wash/dry hands,Wash/dry face,Standing Grooming Details (indicate cue type and reason): cueing to sequence and locate objects Upper Body Dressing : Sitting,Set up Upper Body Dressing Details (indicate cue type and reason): to down back gown Lower Body Dressing: Minimal assistance,Sit to/from stand Lower Body Dressing Details (indicate cue type and reason): able to manage socks, min assist in standing Toilet Transfer: Minimal assistance,Ambulation Toilet Transfer Details (indicate cue type and reason): no AD, simulated Functional mobility during ADLs: Minimal assistance,Cueing for safety General ADL Comments: pt limtied by balance, activity tolerance   Cognition: Cognition Overall Cognitive Status: Difficult to assess Orientation Level: Oriented X4 Attention: Sustained Sustained Attention: Appears intact Awareness: Impaired Awareness Impairment: Emergent impairment Safety/Judgment: Appears intact Cognition Arousal/Alertness: Awake/alert Behavior During Therapy: WFL for tasks assessed/performed Overall Cognitive Status: Difficult to assess Area of Impairment: Problem solving,Awareness Awareness: Emergent Problem Solving: Slow processing,Requires verbal cues,Difficulty sequencing General Comments: slow processing and some decreased awareness (initally reports feeling normal) to deficits, but difficult to understand at times   Blood pressure (!) 129/97, pulse 73, temperature 98.7 F (37.1 C), temperature source Oral, resp. rate 20, height 6' (1.829 m), weight 104.6 kg, SpO2 96 %. Physical Exam Vitals and nursing note reviewed.  Constitutional:      Comments: Pt sitting up in bed watching TV- appropriate, appears 20-30  yrs younger than stated age of 65, NAD; wearing EG's  HENT:     Head: Normocephalic and atraumatic.     Comments: Significant R sided facial droop Tongue deviated to right Facial sensation decreased on Right side    Right Ear: External ear normal.     Left Ear: External ear normal.     Nose: Nose normal. No congestion or rhinorrhea.     Mouth/Throat:     Mouth: Mucous membranes are dry.     Pharynx: Oropharynx is clear. No oropharyngeal exudate.  Eyes:     General:        Right eye: No discharge.        Left eye: No  discharge.     Extraocular Movements: Extraocular movements intact.     Comments: No nystagmus Muddy sclerae B/L  Cardiovascular:     Comments: irregular rhythm, rate controlled Pulmonary:     Comments: CTA B/L- no W/R/R- good air movement occ cough, but nonproductive Abdominal:     Comments: Soft, NT, ND, (+)BS  ; has empty new ostomy bag with pink stoma on LLQ  Musculoskeletal:     Cervical back: Normal range of motion and neck supple.     Comments: LLUE 5/5 RUE- 4+/5 in biceps, triceps ; 4/5 in WE and grip and 2-/5 in FA LLE- 5/5 RLE- HF, KE, DF and PF all 4+/5 in RLE   Skin:    General: Skin is warm and dry.     Comments: L hand IV- looks OK A little dry skin  Neurological:     Mental Status: He is oriented to person, place, and time.     Comments: Very dysarthric Maybe mild aphasia- hard to tell with severity of dysarthria Knew was 2022- made an error- fixed it saying 1922 initially- fixed immediately Also knew was in hospital- but sounded like word salad when tried to say so.  Decreased sensation to light touch in R side of body  Psychiatric:     Comments: Joking some; appropriate        Lab Results Last 24 Hours       Results for orders placed or performed during the hospital encounter of 05/30/20 (from the past 24 hour(s))  Glucose, capillary     Status: Abnormal    Collection Time: 05/31/20  3:57 PM  Result Value Ref Range     Glucose-Capillary 125 (H) 70 - 99 mg/dL  CBC     Status: None    Collection Time: 06/01/20  3:00 AM  Result Value Ref Range    WBC 9.8 4.0 - 10.5 K/uL    RBC 5.10 4.22 - 5.81 MIL/uL    Hemoglobin 14.3 13.0 - 17.0 g/dL    HCT 44.6 39.0 - 52.0 %    MCV 87.5 80.0 - 100.0 fL    MCH 28.0 26.0 - 34.0 pg    MCHC 32.1 30.0 - 36.0 g/dL    RDW 14.4 11.5 - 15.5 %    Platelets 202 150 - 400 K/uL    nRBC 0.0 0.0 - 0.2 %  Basic metabolic panel     Status: Abnormal    Collection Time: 06/01/20  3:00 AM  Result Value Ref Range    Sodium 137 135 - 145 mmol/L    Potassium 4.3 3.5 - 5.1 mmol/L    Chloride 105 98 - 111 mmol/L    CO2 22 22 - 32 mmol/L    Glucose, Bld 117 (H) 70 - 99 mg/dL    BUN 27 (H) 8 - 23 mg/dL    Creatinine, Ser 1.72 (H) 0.61 - 1.24 mg/dL    Calcium 9.0 8.9 - 10.3 mg/dL    GFR, Estimated 37 (L) >60 mL/min    Anion gap 10 5 - 15  Magnesium     Status: None    Collection Time: 06/01/20  3:00 AM  Result Value Ref Range    Magnesium 2.4 1.7 - 2.4 mg/dL  Lipid panel     Status: Abnormal    Collection Time: 06/01/20  3:00 AM  Result Value Ref Range    Cholesterol 102 0 - 200 mg/dL    Triglycerides 49 <150 mg/dL  HDL 38 (L) >40 mg/dL    Total CHOL/HDL Ratio 2.7 RATIO    VLDL 10 0 - 40 mg/dL    LDL Cholesterol 54 0 - 99 mg/dL  Glucose, capillary     Status: Abnormal    Collection Time: 06/01/20  6:33 AM  Result Value Ref Range    Glucose-Capillary 130 (H) 70 - 99 mg/dL  Glucose, capillary     Status: Abnormal    Collection Time: 06/01/20  7:58 AM  Result Value Ref Range    Glucose-Capillary 167 (H) 70 - 99 mg/dL       Imaging Results (Last 48 hours)  DG Chest 1 View   Result Date: 05/31/2020 CLINICAL DATA:  Heart failure EXAM: CHEST  1 VIEW COMPARISON:  03/07/2018 FINDINGS: Post sternotomy changes. Cardiomegaly with vascular congestion and mild diffuse interstitial opacity, likely edema. Patchy atelectasis left base. No large effusion. Aortic atherosclerosis.  IMPRESSION: 1. Cardiomegaly with vascular congestion and probable mild interstitial edema. 2. Streaky atelectasis at the left lung base. Electronically Signed   By: Donavan Foil M.D.   On: 05/31/2020 19:33    NM Pulmonary Perfusion   Result Date: 05/31/2020 CLINICAL DATA:  Difficulty breathing EXAM: NUCLEAR MEDICINE PERFUSION LUNG SCAN TECHNIQUE: Perfusion images were obtained in multiple projections after intravenous injection of radiopharmaceutical. Ventilation scans intentionally deferred if perfusion scan and chest x-ray adequate for interpretation during COVID 19 epidemic. RADIOPHARMACEUTICALS:  4.4 mCi Tc-6m MAA IV COMPARISON:  Chest x-ray from earlier in the same day. FINDINGS: Adequate uptake is noted throughout both lungs. No wedge-shaped defects are identified to suggest pulmonary embolism. IMPRESSION: No evidence of pulmonary embolism. Electronically Signed   By: Inez Catalina M.D.   On: 05/31/2020 22:31    ECHOCARDIOGRAM COMPLETE   Result Date: 05/30/2020    ECHOCARDIOGRAM REPORT   Patient Name:   Jeffery Roach Date of Exam: 05/30/2020 Medical Rec #:  [id number]        Height:       72.0 in Accession #:    [id number]       Weight:       241.0 lb Date of Birth:  Aug 15, 1927         BSA:          2.306 m Patient Age:    89 years         BP:           141/107 mmHg Patient Gender: M                HR:           75 bpm. Exam Location:  Inpatient Procedure: 3D Echo, Cardiac Doppler and Color Doppler Indications:    CVA  History:        Patient has no prior history of Echocardiogram examinations and                 Patient has prior history of Echocardiogram examinations, most                 recent 03/08/2018. CHF, CAD and Previous Myocardial Infarction,                 Stroke, Arrythmias:Atrial Fibrillation; Risk                 Factors:Hypertension, Diabetes, Dyslipidemia and obesity.  Sonographer:    Dustin Flock Referring Phys: [id number] [place]  1. Left ventricular ejection  fraction, by estimation, is 40 to 45%.  The left ventricle has mildly decreased function. The left ventricle demonstrates global hypokinesis. The left ventricular internal cavity size was mildly dilated. Left ventricular diastolic function could not be evaluated. There is the interventricular septum is flattened in systole, consistent with right ventricular pressure overload.  2. Right ventricular systolic function was not well visualized. The right ventricular size is not well visualized. There is severely elevated pulmonary artery systolic pressure.  3. The mitral valve is abnormal. Mild mitral valve regurgitation.  4. Tricuspid valve regurgitation is mild to moderate.  5. The aortic valve is tricuspid. There is moderate calcification of the aortic valve. There is moderate thickening of the aortic valve. Aortic valve regurgitation is moderate.  6. The inferior vena cava is dilated in size with <50% respiratory variability, suggesting right atrial pressure of 15 mmHg. Comparison(s): Prior images unable to be directly viewed, comparison made by report only. Changes from prior study are noted. Echo [facility] 03/08/2018: EF 60-65%, normal RV, no significant valve disease. Conclusion(s)/Recommendation(s): No intracardiac source of embolism detected on this transthoracic study. A transesophageal echocardiogram is recommended to exclude cardiac source of embolism if clinically indicated. Very difficult windows for imaging. Recommend echo contrast for future studies. Irregular rhythm throughout. Appears globally hypokinetic, change from reported normal EF. Findings reported to Dr. Philipp Ovens and her team via Epic. FINDINGS  Left Ventricle: Left ventricular ejection fraction, by estimation, is 40 to 45%. The left ventricle has mildly decreased function. The left ventricle demonstrates global hypokinesis. The left ventricular internal cavity size was mildly dilated. There is  borderline left ventricular hypertrophy. The  interventricular septum is flattened in systole, consistent with right ventricular pressure overload. Left ventricular diastolic function could not be evaluated due to atrial fibrillation. Left ventricular diastolic function could not be evaluated. Right Ventricle: The right ventricular size is not well visualized. Right vetricular wall thickness was not well visualized. Right ventricular systolic function was not well visualized. There is severely elevated pulmonary artery systolic pressure. The tricuspid regurgitant velocity is 3.53 m/s, and with an assumed right atrial pressure of 15 mmHg, the estimated right ventricular systolic pressure is 38.4 mmHg. Left Atrium: Left atrial size was not well visualized. Right Atrium: Right atrial size was not well visualized. Pericardium: There is no evidence of pericardial effusion. Mitral Valve: The mitral valve is abnormal. Mild mitral valve regurgitation. Tricuspid Valve: The tricuspid valve is normal in structure. Tricuspid valve regurgitation is mild to moderate. Aortic Valve: Restricted movement of noncoronary cusp. The aortic valve is tricuspid. There is moderate calcification of the aortic valve. There is moderate thickening of the aortic valve. Aortic valve regurgitation is moderate. Pulmonic Valve: The pulmonic valve was not well visualized. Pulmonic valve regurgitation is trivial. Aorta: The aortic arch was not well visualized. Venous: The inferior vena cava is dilated in size with less than 50% respiratory variability, suggesting right atrial pressure of 15 mmHg. IAS/Shunts: The interatrial septum was not well visualized.  LEFT VENTRICLE PLAX 2D LVIDd:         5.60 cm  Diastology LVIDs:         4.30 cm  LV e' medial:    5.87 cm/s LV PW:         1.10 cm  LV E/e' medial:  14.3 LV IVS:        1.10 cm  LV e' lateral:   8.70 cm/s LVOT diam:     2.20 cm  LV E/e' lateral: 9.7 LV SV:  51 LV SV Index:   22 LVOT Area:     3.80 cm  RIGHT VENTRICLE RV Basal diam:   3.10 cm RV S prime:     6.20 cm/s LEFT ATRIUM             Index       RIGHT ATRIUM           Index LA diam:        4.50 cm 1.95 cm/m  RA Area:     16.50 cm LA Vol (A2C):   49.1 ml 21.29 ml/m RA Volume:   42.90 ml  18.60 ml/m LA Vol (A4C):   54.1 ml 23.46 ml/m LA Biplane Vol: 51.7 ml 22.42 ml/m  AORTIC VALVE LVOT Vmax:   77.00 cm/s LVOT Vmean:  39.300 cm/s LVOT VTI:    0.135 m  AORTA Ao Root diam: 3.10 cm MITRAL VALVE               TRICUSPID VALVE MV Area (PHT): 3.99 cm    TR Peak grad:   49.8 mmHg MV Decel Time: 190 msec    TR Vmax:        353.00 cm/s MV E velocity: 84.00 cm/s MV A velocity: 34.30 cm/s  SHUNTS MV E/A ratio:  2.45        Systemic VTI:  0.14 m                            Systemic Diam: 2.20 cm Buford Dresser MD Electronically signed by Buford Dresser MD Signature Date/Time: 05/30/2020/5:52:47 PM    Final          Assessment/Plan: Diagnosis: B/L cerebellum, R occoiptal and R pons infarct and Large L mCA infarct- embolic cause- with R hemiparesis and aphasia and dysarthria and dysphagia 1. Does the need for close, 24 hr/day medical supervision in concert with the patient's rehab needs make it unreasonable for this patient to be served in a less intensive setting? Potentially 2. Co-Morbidities requiring supervision/potential complications: Afib on Xarelto, CHF, CAD s/p CABG; DM, CKD3, rectal CA with colostomy; GERD; hyperthyroidism with goiter 3. Due to bladder management, bowel management, safety, skin/wound care, disease management, medication administration, pain management and patient education, does the patient require 24 hr/day rehab nursing? Yes 4. Does the patient require coordinated care of a physician, rehab nurse, therapy disciplines of PT, OT, and SLP to address physical and functional deficits in the context of the above medical diagnosis(es)? Yes Addressing deficits in the following areas: balance, endurance, locomotion, strength, transferring, bathing,  dressing, feeding, grooming, toileting, speech, language and swallowing 5. Can the patient actively participate in an intensive therapy program of at least 3 hrs of therapy per day at least 5 days per week? Yes 6. The potential for patient to make measurable gains while on inpatient rehab is excellent 7. Anticipated functional outcomes upon discharge from inpatient rehab are modified independent and supervision  with PT, modified independent and supervision with OT, modified independent and supervision with SLP. 8. Estimated rehab length of stay to reach the above functional goals is: 7-10 days 9. Anticipated discharge destination: Home 10. Overall Rehab/Functional Prognosis: excellent   RECOMMENDATIONS: This patient's condition is appropriate for continued rehabilitative care in the following setting: CIR and HH Therapy Patient has agreed to participate in recommended program. Potentially Note that insurance prior authorization may be required for reimbursement for recommended care.   Comment:  1. Pt wants to go  home- knows could benefit from inpt CIR/rehab, but was pretty emphatic doesn't want to go to CIR.  2. Asked him to discuss with his wife and then make a decision-  3. Pt could and would benefit from CIR/inpt rehab- for PT, OT and SLP based on his exam- esp SLP.  4. Will d/w admissions coordinators-  5. Thank you for this consult.    Bary Leriche, PA-C 06/01/2020          Revision History         Routing History              Note Details  Author Jeffery Heys, MD File Time 06/01/2020  2:34 PM  Author Type Physician Status Signed  Last Editor Jeffery Heys, MD Service Physical Medicine and Rehabilitation

## 2020-06-03 NOTE — Progress Notes (Signed)
Prior auth for Eliquis completed. Ref # [id number] auth #: [id number] Approved through 01/01/2021 [employer]: [phone]  Still a tier 4 medication with $254.94 copay

## 2020-06-03 NOTE — H&P (Signed)
Physical Medicine and Rehabilitation Admission H&P        Chief Complaint  Patient presents with  .  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  He was found to have small acute infarct in bilateral cerebellum, right occipital cortex and possibly right pons with no flow through most of the right-created distal V4 segment reconstitution and remote large-MCA infarct.  2D echo showed EF of 40 to 45% with global hypokinesis and severely elevated PAH.  Stroke was felt to be embolic in setting of chronic A. fib and DOAC was changed to renal dose Eliquis.  Hospital course significant for A. fib with RVR with occasional NSVT and he was started on beta-blocker however developed bradycardia with this therefore this was changed to amiodarone with good rate control.  Therapy has been ongoing and patient continues to be limited by severe dysarthria with mild oropharyngeal dysphagia, balance deficits, diplopia as well as worsening of right hemiplegia.  CIR was recommended due to functional decline.   Pt reports he still is having difficulty talking and making himself understood.  Also difficulty with swallowing secretions when talking.   Having BMs via colostomy but its harder.    Review of Systems  Constitutional: Negative for chills and fever.  HENT: Negative for hearing loss and tinnitus.   Eyes: Negative for blurred vision and double vision.  Respiratory: Positive for shortness of breath. Negative for cough.   Cardiovascular: Negative for chest pain and palpitations.  Gastrointestinal: Positive for constipation. Negative for heartburn and nausea.  Genitourinary: Negative for dysuria and urgency.  Musculoskeletal: Negative for myalgias and neck pain.   Skin: Negative for rash.  Neurological: Positive for sensory change, speech change and weakness. Negative for dizziness and headaches.  Psychiatric/Behavioral: The patient does not have insomnia.   All other systems reviewed and are negative.         Past Medical History:  Diagnosis Date  . Acute on chronic systolic (congestive) heart failure ([place])    . Anemia due to stage 3 chronic kidney disease ([place])    . Arteriosclerotic coronary artery disease    . Atrial fibrillation ([place])    . Benign hypertension with chronic kidney disease, stage III ([place])    . Cerebral artery occlusion with cerebral infarction ([place])    . Cortical age-related cataract of both eyes    . Dermatochalasis of both upper eyelids    . Diabetes mellitus with stage 3 chronic kidney disease ([place])    . Gastroesophageal reflux disease without esophagitis    . History of heart attack    . Hypercalcemia    . Hyperlipidemia    . Hyperopia with astigmatism and presbyopia, bilateral    . Hypertension with goal to be determined    . Hyperthyroidism    . Hypokalemia    . LV dysfunction    . Malignant neoplasm of prostate ([place])    . Metabolic bone disease    . Myocardial infarction ([place])    . Nuclear sclerotic cataract of both eyes    . Personal history of rectal cancer    . Toxic multinodular goiter    . Toxic multinodular goiter    . Transient ischemic attack    . Type 2 diabetes mellitus  with hyperglycemia ([place])    . Vitamin D deficiency    . Vitreous floaters of both eyes             Past Surgical History:  Procedure Laterality Date  . CARDIAC SURGERY      . COLOPROCTECTOMY      . CORONARY ARTERY BYPASS GRAFT      . GASTROSTOMY TUBE PLACEMENT      . INGUINAL HERNIA REPAIR      . TONSILLECTOMY               Family History  Problem Relation Age of Onset  . Diabetes Sister    . Stroke Sister    . Hypertension Sister        Social History:  Married. Independent PTA. He reports that he has quit  smoking. He has never used smokeless tobacco. No history on file for alcohol use and drug use.      Allergies: No Known Allergies            Medications Prior to Admission  Medication Sig Dispense Refill  . amLODipine (NORVASC) 5 MG tablet Take 5 mg by mouth daily after breakfast.      . aspirin EC 81 MG tablet Take 81 mg by mouth daily after breakfast.      . atorvastatin (LIPITOR) 10 MG tablet Take 1 tablet (10 mg total) by mouth daily. (Patient taking differently: Take 10 mg by mouth daily after breakfast.) 30 tablet 0  . cholecalciferol (VITAMIN D) 25 MCG (1000 UT) tablet Take 1,000 Units by mouth daily after breakfast.      . dapagliflozin propanediol (FARXIGA) 10 MG TABS tablet Take 10 mg by mouth daily before breakfast.      . ferrous sulfate 325 (65 FE) MG tablet Take 1 tablet (325 mg total) by mouth daily with breakfast. (Patient taking differently: Take 325 mg by mouth daily after breakfast.) 30 tablet 0  . furosemide (LASIX) 40 MG tablet Take 40 mg by mouth daily after breakfast.      . glipiZIDE (GLUCOTROL XL) 10 MG 24 hr tablet Take 1 tablet (10 mg total) by mouth daily with breakfast. (Patient taking differently: Take 10 mg by mouth daily before breakfast.) 30 tablet 0  . pantoprazole (PROTONIX) 40 MG tablet Take 40 mg by mouth daily after breakfast.      . potassium chloride (KLOR-CON) 10 MEQ tablet Take 10 mEq by mouth daily before supper.      . rivaroxaban (XARELTO) 20 MG TABS tablet Take 1 tablet (20 mg total) by mouth daily with supper. (Patient taking differently: Take 20 mg by mouth daily after breakfast.) 30 tablet 0  . diltiazem (CARDIZEM) 120 MG tablet Take 1 tablet (120 mg total) by mouth daily. (Patient not taking: No sig reported) 30 tablet 0  . insulin lispro (HUMALOG) 100 UNIT/ML injection Inject 0.03-0.15 mLs (3-15 Units total) into the skin 3 (three) times daily with meals. SSI: 151-200: 3 units; 201-250: 5 units; 251-300: 8 units; 301-350: 11 units; 351-400: 15  units (Patient not taking: No sig reported) 10 mL 0  . metoprolol succinate (TOPROL-XL) 25 MG 24 hr tablet Take 1 tablet (25 mg total) by mouth daily. (Patient not taking: No sig reported) 30 tablet 0      Drug Regimen Review  Drug regimen was reviewed and remains appropriate with no significant issues identified   Home: Home Living Family/patient expects to be discharged to:: Private residence Living Arrangements: Spouse/significant other Available  Help at Discharge: Family,Available 24 hours/day Type of Home: House Home Access: Stairs to enter [employer] of Steps: 2 Home Layout: One level Bathroom Shower/Tub: Multimedia programmer: Standard Bathroom Accessibility: Yes Home Equipment: Cane - single point Additional Comments: Patient loves to play checkers and poker  Lives With: Spouse   Functional History: Prior Function Level of Independence: Independent Comments: independent with ADLs, mobility; spouse completes IADLs and med mgmt   Functional Status:  Mobility: Bed Mobility Overal bed mobility: Needs Assistance Bed Mobility: Supine to Sit Supine to sit: Supervision General bed mobility comments: supervision for safety. Use of momentum for trunk elevation Transfers Overall transfer level: Needs assistance Equipment used: None Transfers: Sit to/from Stand Sit to Stand: Supervision General transfer comment: supervision for safety. repeated sit to stand x 10 from recliner with use of momentum to rise Ambulation/Gait Ambulation/Gait assistance: Min assist Gait Distance (Feet): 250 Feet Assistive device: None Gait Pattern/deviations: Step-through pattern,Decreased stride length,Wide base of support,Decreased weight shift to right General Gait Details: minA for balance due to unsteadiness with no AD. Decreased weight shift to R during ambulation with short shuffle steps at times.Cues for forward gaze during ambulation due to patient tending to look at  ground Gait velocity: decreased Gait velocity interpretation: <1.8 ft/sec, indicate of risk for recurrent falls   ADL: ADL Overall ADL's : Needs assistance/impaired Grooming: Wash/dry hands,Wash/dry face,Standing,Minimal assistance Grooming Details (indicate cue type and reason): VC's to incorporate R hand and arm with functional tasks, even as stabilize assist. Upper Body Dressing : Sitting,Set up Upper Body Dressing Details (indicate cue type and reason): to down back gown Lower Body Dressing: Minimal assistance,Sit to/from stand Lower Body Dressing Details (indicate cue type and reason): able to manage socks, min assist in standing Toilet Transfer: Minimal assistance,Ambulation Toilet Transfer Details (indicate cue type and reason): no AD, simulated Functional mobility during ADLs: Minimal assistance,Cueing for safety General ADL Comments: pt limtied by balance, activity tolerance   Cognition: Cognition Overall Cognitive Status: Difficult to assess Orientation Level: Oriented X4 Attention: Sustained Sustained Attention: Appears intact Awareness: Impaired Awareness Impairment: Emergent impairment Safety/Judgment: Appears intact Cognition Arousal/Alertness: Awake/alert Behavior During Therapy: WFL for tasks assessed/performed Overall Cognitive Status: Difficult to assess Area of Impairment: Problem solving,Awareness Awareness: Emergent Problem Solving: Slow processing,Requires verbal cues,Difficulty sequencing General Comments: Difficult to understand due to chronic aphasia but following all commands Difficult to assess due to: Impaired communication     Blood pressure (!) 130/96, pulse 77, temperature 98.8 F (37.1 C), temperature source Oral, resp. rate 16, height 6' (1.829 m), weight 104.6 kg, SpO2 96 %. Physical Exam Vitals and nursing note reviewed.  Constitutional:      Appearance: Normal appearance.     Comments: Up in chair with poor postural reflexes, slumped to  the left and needed cues to maintain midline posture.  NAD.  Questioning cobbler on meal tray. Tall elderly gentleman, looks much younger than age; sitting up in bedside chair, wearing shoes with PJs, NAD  HENT:     Head: Normocephalic.     Comments: Significant R facial droop and R tongue deviation Also missing some teeth    Right Ear: External ear normal.     Left Ear: External ear normal.     Nose: Nose normal. No congestion.     Mouth/Throat:     Mouth: Mucous membranes are moist.     Pharynx: Oropharynx is clear. No oropharyngeal exudate.  Eyes:     General:  Right eye: No discharge.        Left eye: No discharge.     Extraocular Movements: Extraocular movements intact.     Comments: Muddy sclerae  Cardiovascular:     Rate and Rhythm: Normal rate. Rhythm irregular.     Heart sounds: Normal heart sounds. No murmur heard. No gallop.   Pulmonary:     Comments: CTA B/L- no W/R/R- good air movement  Abdominal:     Comments: Colostomy with formed stool. Soft, NT, ND, (+)BS  colostomy -stoma pink  Musculoskeletal:     Cervical back: Normal range of motion and neck supple.     Comments: RUE- 4/5 except Finger abd 2/5 LUE 5/5 RLE- 4+/5 in HF, KE, DF and PF LLE- 5/5  Skin:    Comments: L hand IV- looks OK No skin breakdown  Neurological:     Mental Status: He is alert and oriented to person, place, and time.     Comments: Expressive aphasia with moderate to severe dysarthria. Able to follow simple motor commands. Right hemiparesis with sensory deficits  Severe dysarthria complicated by some aphasia- word finding deficits.  Ox3  Psychiatric:     Comments: A little frustrated over communication        Lab Results Last 48 Hours        Results for orders placed or performed during the hospital encounter of 05/30/20 (from the past 48 hour(s))  Glucose, capillary     Status: Abnormal    Collection Time: 06/01/20 12:26 PM  Result Value Ref Range    Glucose-Capillary 144  (H) 70 - 99 mg/dL      Comment: Glucose reference range applies only to samples taken after fasting for at least 8 hours.  Glucose, capillary     Status: Abnormal    Collection Time: 06/01/20  4:19 PM  Result Value Ref Range    Glucose-Capillary 189 (H) 70 - 99 mg/dL      Comment: Glucose reference range applies only to samples taken after fasting for at least 8 hours.  Glucose, capillary     Status: Abnormal    Collection Time: 06/01/20  8:24 PM  Result Value Ref Range    Glucose-Capillary 146 (H) 70 - 99 mg/dL      Comment: Glucose reference range applies only to samples taken after fasting for at least 8 hours.  Glucose, capillary     Status: Abnormal    Collection Time: 06/01/20 11:46 PM  Result Value Ref Range    Glucose-Capillary 171 (H) 70 - 99 mg/dL      Comment: Glucose reference range applies only to samples taken after fasting for at least 8 hours.  CBC     Status: None    Collection Time: 06/02/20  2:42 AM  Result Value Ref Range    WBC 9.2 4.0 - 10.5 K/uL    RBC 5.11 4.22 - 5.81 MIL/uL    Hemoglobin 14.5 13.0 - 17.0 g/dL    HCT 44.8 39.0 - 52.0 %    MCV 87.7 80.0 - 100.0 fL    MCH 28.4 26.0 - 34.0 pg    MCHC 32.4 30.0 - 36.0 g/dL    RDW 14.3 11.5 - 15.5 %    Platelets 196 150 - 400 K/uL    nRBC 0.0 0.0 - 0.2 %      Comment: Performed at [facility], [place] [address] [place], [place] [address]  Basic metabolic panel     Status:  Abnormal    Collection Time: 06/02/20  2:42 AM  Result Value Ref Range    Sodium 136 135 - 145 mmol/L    Potassium 3.8 3.5 - 5.1 mmol/L    Chloride 104 98 - 111 mmol/L    CO2 22 22 - 32 mmol/L    Glucose, Bld 142 (H) 70 - 99 mg/dL      Comment: Glucose reference range applies only to samples taken after fasting for at least 8 hours.    BUN 30 (H) 8 - 23 mg/dL    Creatinine, Ser 2.12 (H) 0.61 - 1.24 mg/dL    Calcium 9.4 8.9 - 10.3 mg/dL    GFR, Estimated 29 (L) >60 mL/min      Comment: (NOTE) Calculated using the CKD-EPI Creatinine  Equation (2021)      Anion gap 10 5 - 15      Comment: Performed at [facility] [address] [place], [place] [address]  Glucose, capillary     Status: Abnormal    Collection Time: 06/02/20  6:24 AM  Result Value Ref Range    Glucose-Capillary 137 (H) 70 - 99 mg/dL      Comment: Glucose reference range applies only to samples taken after fasting for at least 8 hours.  Glucose, capillary     Status: Abnormal    Collection Time: 06/02/20 12:07 PM  Result Value Ref Range    Glucose-Capillary 174 (H) 70 - 99 mg/dL      Comment: Glucose reference range applies only to samples taken after fasting for at least 8 hours.  Glucose, capillary     Status: Abnormal    Collection Time: 06/02/20  4:56 PM  Result Value Ref Range    Glucose-Capillary 150 (H) 70 - 99 mg/dL      Comment: Glucose reference range applies only to samples taken after fasting for at least 8 hours.  Glucose, capillary     Status: Abnormal    Collection Time: 06/02/20  9:15 PM  Result Value Ref Range    Glucose-Capillary 153 (H) 70 - 99 mg/dL      Comment: Glucose reference range applies only to samples taken after fasting for at least 8 hours.  CBC     Status: None    Collection Time: 06/03/20  3:06 AM  Result Value Ref Range    WBC 9.9 4.0 - 10.5 K/uL    RBC 4.91 4.22 - 5.81 MIL/uL    Hemoglobin 13.8 13.0 - 17.0 g/dL    HCT 43.2 39.0 - 52.0 %    MCV 88.0 80.0 - 100.0 fL    MCH 28.1 26.0 - 34.0 pg    MCHC 31.9 30.0 - 36.0 g/dL    RDW 14.2 11.5 - 15.5 %    Platelets 197 150 - 400 K/uL    nRBC 0.0 0.0 - 0.2 %      Comment: Performed at [facility], [place] [address] [place], [place] [address]  Basic metabolic panel     Status: Abnormal    Collection Time: 06/03/20  3:06 AM  Result Value Ref Range    Sodium 136 135 - 145 mmol/L    Potassium 4.3 3.5 - 5.1 mmol/L    Chloride 107 98 - 111 mmol/L    CO2 24 22 - 32 mmol/L    Glucose, Bld 146 (H) 70 - 99 mg/dL      Comment: Glucose reference range applies  only to samples taken after  fasting for at least 8 hours.    BUN 32 (H) 8 - 23 mg/dL    Creatinine, Ser 1.97 (H) 0.61 - 1.24 mg/dL    Calcium 9.4 8.9 - 10.3 mg/dL    GFR, Estimated 32 (L) >60 mL/min      Comment: (NOTE) Calculated using the CKD-EPI Creatinine Equation (2021)      Anion gap 5 5 - 15      Comment: Performed at [facility] [address], [place], [place] [address]  Glucose, capillary     Status: Abnormal    Collection Time: 06/03/20  6:15 AM  Result Value Ref Range    Glucose-Capillary 138 (H) 70 - 99 mg/dL      Comment: Glucose reference range applies only to samples taken after fasting for at least 8 hours.      Imaging Results (Last 48 hours)  No results found.           Medical Problem List and Plan: 1.  R hemiparesis, dysarthria and aphasia secondary to small acute infarct in bilateral cerebellum, right occipital cortex and possibly right pons             -patient may  shower             -ELOS/Goals: 5-7 days mod I except will need Assist with SLP/communication 2.  Antithrombotics: -DVT/anticoagulation:  Pharmaceutical: Other (comment)--now on Eliquis renal dose             -antiplatelet therapy: low dose ASA 3. Pain Management: N/A 4. Mood: LCSW to follow for evaluation and support.              -antipsychotic agents: N/a 5. Neuropsych: This patient maybe  capable of making decisions on his own behalf. 6. Skin/Wound Care: Routine pressure relief measures  7. Fluids/Electrolytes/Nutrition: strict I/O. Check CMET in am.  8.  Chronic A fib: On amiodarone for NSVT/rate control (had bradycardia with BB)             Monitor HR TID and follow up with Dr. Terrence Dupont after d/c 9. T2DM: Will check Hgb A1C in am. Resume dapagligozin. Hold glipizide due to CKD.              --Monitor BS ac/hs and continue SSI for elevated BS.              --Carb modified/heart healthy restrictions added. 10. CKDIIIb: SCR 1.8 at admission-->2.12-->1.97 11. CAD s/p CABG/ICM with  EF 40-45%: Monitor for signs of overload. Check weights daily.              --Was on dapaglifozin (resume for cardiac/DM), Lasix and atorvastatin PTA.  12. Hypocalcemia: Ionized Calcium 1.09. Add oral supplement.  13. Constipation: Will add 1 senna S at bedtime to help soften stools.         Bary Leriche, PA-C 06/03/2020    I have personally performed a face to face diagnostic evaluation of this patient and formulated the key components of the plan.  Additionally, I have personally reviewed laboratory data, imaging studies, as well as relevant notes and concur with the physician assistant's documentation above.

## 2020-06-03 NOTE — TOC Transition Note (Signed)
Transition of Care [facility]) - CM/SW Discharge Note   Patient Details  Name: Jeffery Roach MRN: [id number] Date of Birth: [birth year]  Transition of Care [facility]) CM/SW Contact:  Pollie Friar, RN Phone Number: 06/03/2020, 10:39 AM   Clinical Narrative:    Patient discharging to CIR today. CM signing off.   Final next level of care: IP Rehab Facility Barriers to Discharge: No Barriers Identified   Patient Goals and CMS Choice        Discharge Placement                       Discharge Plan and Services                                     Social Determinants of Health (SDOH) Interventions     Readmission Risk Interventions No flowsheet data found.

## 2020-06-03 NOTE — Progress Notes (Signed)
Inpatient Rehabilitation Admissions Coordinator  CIR bed is available to admit patient to today. I met with patient at bedside and he is in agreement. I will notify Acute team, TOC , wife and daughter, Baker Janus to make the arrangements to admit to CIR.  Danne Baxter, RN, MSN Rehab Admissions Coordinator [phone] 06/03/2020 10:03 AM

## 2020-06-03 NOTE — Progress Notes (Addendum)
Inpatient Rehabilitation Medication Review by a Pharmacist  A complete drug regimen review was completed for this patient to identify any potential clinically significant medication issues.  Clinically significant medication issues were identified:  yes   Type of Medication Issue Identified Description of Issue Urgent (address now) Non-Urgent (address on AM team rounds) Plan Plan Accepted by Provider? (Yes / No / Pending AM Rounds)  Drug Interaction(s) (clinically significant)       Duplicate Therapy       Allergy       No Medication Administration End Date       Incorrect Dose       Additional Drug Therapy Needed       Other  Farxiga ordered, patient has renal impairement and hasnt been on it inpatient.  Non urgent, left unverified with comment.  Pending AM rounds to determine if appropriate or needs to be d/c;d Pt has been on for CHF and CRI is not new. Ok to cont per Algis Liming    Name of provider notified for urgent issues identified:   Provider Method of Notification: Attempted secure chat Dr Posey Pronto    For non-urgent medication issues to be resolved on team rounds tomorrow morning a [facility] Secure Chat Handoff was sent to:    Pharmacist comments:   Time spent performing this drug regimen review (minutes):  15

## 2020-06-04 DIAGNOSIS — G8191 Hemiplegia, unspecified affecting right dominant side: Secondary | ICD-10-CM

## 2020-06-04 DIAGNOSIS — R531 Weakness: Secondary | ICD-10-CM | POA: Diagnosis not present

## 2020-06-04 DIAGNOSIS — I5042 Chronic combined systolic (congestive) and diastolic (congestive) heart failure: Secondary | ICD-10-CM | POA: Insufficient documentation

## 2020-06-04 DIAGNOSIS — I639 Cerebral infarction, unspecified: Secondary | ICD-10-CM

## 2020-06-04 DIAGNOSIS — I482 Chronic atrial fibrillation, unspecified: Secondary | ICD-10-CM

## 2020-06-04 DIAGNOSIS — I69391 Dysphagia following cerebral infarction: Secondary | ICD-10-CM

## 2020-06-04 DIAGNOSIS — R471 Dysarthria and anarthria: Secondary | ICD-10-CM

## 2020-06-04 DIAGNOSIS — E1165 Type 2 diabetes mellitus with hyperglycemia: Secondary | ICD-10-CM

## 2020-06-04 LAB — CBC WITH DIFFERENTIAL/PLATELET
Abs Immature Granulocytes: 0.03 10*3/uL (ref 0.00–0.07)
Basophils Absolute: 0.1 10*3/uL (ref 0.0–0.1)
Basophils Relative: 1 %
Eosinophils Absolute: 0.5 10*3/uL (ref 0.0–0.5)
Eosinophils Relative: 6 %
HCT: 45.3 % (ref 39.0–52.0)
Hemoglobin: 14.5 g/dL (ref 13.0–17.0)
Immature Granulocytes: 0 %
Lymphocytes Relative: 33 %
Lymphs Abs: 3.1 10*3/uL (ref 0.7–4.0)
MCH: 28 pg (ref 26.0–34.0)
MCHC: 32 g/dL (ref 30.0–36.0)
MCV: 87.6 fL (ref 80.0–100.0)
Monocytes Absolute: 0.8 10*3/uL (ref 0.1–1.0)
Monocytes Relative: 9 %
Neutro Abs: 4.9 10*3/uL (ref 1.7–7.7)
Neutrophils Relative %: 51 %
Platelets: 187 10*3/uL (ref 150–400)
RBC: 5.17 MIL/uL (ref 4.22–5.81)
RDW: 14.3 % (ref 11.5–15.5)
WBC: 9.3 10*3/uL (ref 4.0–10.5)
nRBC: 0 % (ref 0.0–0.2)

## 2020-06-04 LAB — COMPREHENSIVE METABOLIC PANEL
ALT: 18 U/L (ref 0–44)
AST: 23 U/L (ref 15–41)
Albumin: 3 g/dL — ABNORMAL LOW (ref 3.5–5.0)
Alkaline Phosphatase: 58 U/L (ref 38–126)
Anion gap: 10 (ref 5–15)
BUN: 29 mg/dL — ABNORMAL HIGH (ref 8–23)
CO2: 20 mmol/L — ABNORMAL LOW (ref 22–32)
Calcium: 9.6 mg/dL (ref 8.9–10.3)
Chloride: 107 mmol/L (ref 98–111)
Creatinine, Ser: 1.92 mg/dL — ABNORMAL HIGH (ref 0.61–1.24)
GFR, Estimated: 33 mL/min — ABNORMAL LOW (ref >60)
Glucose, Bld: 123 mg/dL — ABNORMAL HIGH (ref 70–99)
Potassium: 4.4 mmol/L (ref 3.5–5.1)
Sodium: 137 mmol/L (ref 135–145)
Total Bilirubin: 0.8 mg/dL (ref 0.3–1.2)
Total Protein: 7.6 g/dL (ref 6.5–8.1)

## 2020-06-04 LAB — GLUCOSE, CAPILLARY
Glucose-Capillary: 161 mg/dL — ABNORMAL HIGH (ref 70–99)
Glucose-Capillary: 154 mg/dL — ABNORMAL HIGH (ref 70–99)
Glucose-Capillary: 143 mg/dL — ABNORMAL HIGH (ref 70–99)
Glucose-Capillary: 159 mg/dL — ABNORMAL HIGH (ref 70–99)

## 2020-06-04 LAB — HEMOGLOBIN A1C
Hgb A1c MFr Bld: 8.4 % — ABNORMAL HIGH (ref 4.8–5.6)
Mean Plasma Glucose: 194 mg/dL

## 2020-06-04 MED ORDER — DAPAGLIFLOZIN PROPANEDIOL 10 MG PO TABS
10.0000 mg | ORAL_TABLET | Freq: Every day | ORAL | Status: DC
  Administered 2020-06-04 – 2020-06-10 (×7): 10 mg via ORAL
  Filled 2020-06-04 (×8): qty 1

## 2020-06-04 NOTE — IPOC Note (Signed)
Individualized overall Plan of Care [facility]) Patient Details Name: Jeffery Roach MRN: [id number] DOB: [birth year]  Admitting Diagnosis: Cerebellar stroke [facility])  Hospital Problems: Principal Problem:   Cerebellar stroke [facility]) Active Problems:   Right sided weakness   Dysarthria   Chronic combined systolic and diastolic congestive heart failure (HCC)   Chronic atrial fibrillation (HCC)   Right hemiparesis (HCC)   Dysphagia, post-stroke     Functional Problem List: Nursing Bowel,Bladder,Endurance,Safety,Medication Management  PT Balance,Safety,Endurance,Motor,Perception  OT Balance,Cognition,Endurance,Motor,Perception,Safety,Sensory  SLP Cognition,Motor,Sensory  TR         Basic ADL's: OT Grooming,Bathing,Dressing,Toileting     Advanced  ADL's: OT Simple Meal Preparation     Transfers: PT Bed Mobility,Bed to Chair,Car,Furniture  OT Toilet,Tub/Shower     Locomotion: PT Ambulation,Stairs     Additional Impairments: OT Fuctional Use of Upper Extremity  SLP Swallowing,Communication,Social Cognition expression Memory  TR      Anticipated Outcomes Item Anticipated Outcome  Self Feeding mod I  Swallowing  Mod I   Basic self-care  mod I-spvsn  Toileting  mod I-spvsn   Bathroom Transfers mod I-spvsn  Bowel/Bladder  Manage bowel with min assist and bladder with mod I assist  Transfers  Supervision  Locomotion  Supervision with LRAD  Communication  Supervision  Cognition  Mod I  Pain  n/a  Safety/Judgment  Maintain safety wtih cues/reminders   Therapy Plan: PT Intensity: Minimum of 1-2 x/day ,45 to 90 minutes PT Frequency: 5 out of 7 days PT Duration Estimated Length of Stay: 7-9 days OT Intensity: Minimum of 1-2 x/day, 45 to 90 minutes OT Frequency: 5 out of 7 days OT Duration/Estimated Length of Stay: 7-10 days SLP Intensity: Minumum of 1-2 x/day, 30 to 90 minutes SLP Frequency: 3 to 5 out of 7 days SLP Duration/Estimated Length of Stay: 5-7 days     Team Interventions: Nursing Interventions Skin Care/Wound Management  PT interventions Ambulation/gait training,Balance/vestibular training,Community reintegration,Disease management/prevention,Functional electrical stimulation,Neuromuscular re-education,Patient/family education,Stair training,Skin care/wound management,Discharge planning,DME/adaptive equipment instruction,Functional mobility training,Splinting/orthotics,Therapeutic Activities,UE/LE Strength taining/ROM,Visual/perceptual remediation/compensation  OT Interventions Balance/vestibular training,Discharge planning,Functional electrical stimulation,Self Care/advanced ADL retraining,Pain management,Therapeutic Activities,UE/LE Coordination activities,Cognitive remediation/compensation,Functional mobility training,Disease mangement/prevention,Patient/family education,Therapeutic Exercise,Visual/perceptual remediation/compensation,Community reintegration,DME/adaptive equipment instruction,Neuromuscular re-education,Psychosocial support,Splinting/orthotics,UE/LE Strength taining/ROM  SLP Interventions Cognitive remediation/compensation,Speech/Language facilitation,Dysphagia/aspiration precaution training,Functional tasks,Patient/family education,Cueing hierarchy,Environmental controls,Internal/external aids  TR Interventions    SW/CM Interventions Discharge Planning,Psychosocial Support,Patient/Family Education   Barriers to Discharge MD  Medical stability and Weight  Nursing Decreased caregiver support,Home environment access/layout 1 level 2 ste o rails with wife and daughter (works)  PT      OT Decreased caregiver support,Home environment access/layout pt's wife able to provide spvsn but not any physical assistance; 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, product/process development  SLP      SW Lack of/limited family support wife elderly and small stature   Team Discharge Planning: Destination: PT-Home ,OT- Home , SLP-Home Projected  Follow-up: PT-Home health PT,24 hour supervision/assistance, OT-  Outpatient OT, SLP-24 hour supervision/assistance Projected Equipment Needs: PT-Rolling walker with 5" wheels (patient may already have one), OT- 3 in 1 bedside comode,Tub/shower bench, SLP-None recommended by SLP Equipment Details: PT- , OT-  Patient/family involved in discharge planning: PT- Patient,Family member/caregiver,  OT-Patient, SLP-Patient,Family member/caregiver  MD ELOS: 5-7 days. Medical Rehab Prognosis:  Good Assessment: 87 year old RH- male with history of CAD, T2DM with CKD 3, CVA with mild dysarthria and residual right hemiparesis, rectal/prostate cancer, CAF-Xarelto who was admitted on 05/31/2018 with slurred speech and difficulty standing.  He was found to have small acute  infarct in bilateral cerebellum, right occipital cortex and possibly right pons with no flow through most of the right-created distal V4 segment reconstitution and remote large-MCA infarct.  2D echo showed EF of 40 to 45% with global hypokinesis and severely elevated PAH.  Stroke was felt to be embolic in setting of chronic A. fib and DOAC was changed to renal dose Eliquis.  Hospital course significant for A. fib with RVR with occasional NSVT and he was started on beta-blocker however developed bradycardia with this therefore this was changed to amiodarone with good rate control.  Therapy has been ongoing and patient continues to be limited by severe dysarthria with mild oropharyngeal dysphagia, expressive aphasia, balance deficits, diplopia as well as worsening of right hemiparesis.  We will set goals for Mod I/Supervision with PT/OT/SLP.   Due to the current state of emergency, patients may not be receiving their 3-hours of Medicare-mandated therapy.  See Team Conference Notes for weekly updates to the plan of care

## 2020-06-04 NOTE — Progress Notes (Signed)
Inpatient Rehabilitation  Patient information reviewed and entered into eRehab system by Livi Mcgann M. Kendall Justo, M.A., CCC/SLP, PPS Coordinator.  Information including medical coding, functional ability and quality indicators will be reviewed and updated through discharge.    

## 2020-06-04 NOTE — Evaluation (Signed)
Speech Language Pathology Assessment and Plan  Patient Details  Name: Jeffery Roach MRN: [id number] Date of Birth: [birth year]  SLP Diagnosis: Dysarthria;Speech and Language deficits;Cognitive Impairments;Dysphagia  Rehab Potential: Good ELOS: 5-7 days    Today's Date: 06/04/2020 SLP Individual Time: 1030-1130 SLP Individual Time Calculation (min): 60 min   Hospital Problem: Principal Problem:   Cerebellar stroke ([place]) Active Problems:   Right sided weakness   Dysarthria   Chronic combined systolic and diastolic congestive heart failure (HCC)   Chronic atrial fibrillation (HCC)   Right hemiparesis ([place])   Dysphagia, post-stroke  Past Medical History:  Past Medical History:  Diagnosis Date  . Acute on chronic systolic (congestive) heart failure ([place])   . Anemia due to stage 3 chronic kidney disease ([place])   . Arteriosclerotic coronary artery disease   . Atrial fibrillation ([place])   . Benign hypertension with chronic kidney disease, stage III ([place])   . Cerebral artery occlusion with cerebral infarction ([place])   . Cortical age-related cataract of both eyes   . Dermatochalasis of both upper eyelids   . Diabetes mellitus with stage 3 chronic kidney disease ([place])   . Gastroesophageal reflux disease without esophagitis   . History of heart attack   . Hypercalcemia   . Hyperlipidemia   . Hyperopia with astigmatism and presbyopia, bilateral   . Hypertension with goal to be determined   . Hyperthyroidism   . Hypokalemia   . LV dysfunction   . Malignant neoplasm of prostate ([place])   . Metabolic bone disease   . Myocardial infarction ([place])   . Nuclear sclerotic cataract of both eyes   . Personal history of rectal cancer   . Toxic multinodular goiter   . Toxic multinodular goiter   . Transient ischemic attack   . Type 2 diabetes mellitus with hyperglycemia ([place])   . Vitamin D deficiency   . Vitreous floaters of both eyes    Past Surgical History:  Past Surgical History:   Procedure Laterality Date  . CARDIAC SURGERY    . COLOPROCTECTOMY    . CORONARY ARTERY BYPASS GRAFT    . GASTROSTOMY TUBE PLACEMENT    . INGUINAL HERNIA REPAIR    . TONSILLECTOMY      Assessment / Plan / Recommendation Clinical Impression  Jeffery Roach is a 87 year old RH- male with history of CAD, T2DM with CKD 3, CVA with mild dysarthria and residual right hemiparesis, rectal/prostate cancer, CAF-Xarelto who was admitted on 05/31/2018 with slurred speech and difficulty standing.  He was found to have small acute infarct in bilateral cerebellum, right occipital cortex and possibly right pons with no flow through most of the right-created distal V4 segment reconstitution and remote large-MCA infarct. Therapy has been ongoing and patient continues to be limited by severe dysarthria with mild oropharyngeal dysphagia, balance deficits, diplopia as well as worsening of right hemiplegia.  CIR was recommended due to functional decline. Patient admitted 06/03/2020  Patient presents with a mild oral dysphagia characterized by prolonged mastication, and oral residue with solid consistencies with decreased awareness. Secondary dry swallow and liquid rinse was effective in clearing. Patient appeared to exhibit timely swallow initiation. No pharyngeal symptoms observed. No overt signs or symptoms of aspiration observed among tested consistencies with clear vocal quality post swallows. Recommend continuation of Dys 3 diet and thin liquids. Pill may be taken whole with liquids as tolerated. Patient reports consuming "soft" textures at baseline.   Patient presents with severe dysarthria characterized by impaired articulatory precision likely secondary  to decreased lingual ROM and strength resulting in decreased ability to articulate wants/needs. Patient was perceived as approximately 25% intelligible at word and phrase level to this unknown listener with both known and unknown contexts. Patient presents with a  mild expressive aphasia characterized by intermittent word finding difficulty and occasional phonemic paraphasias. Patient named 7/10 objects accurately, and able to generate 3/5 words within simple categories. Benefited from phonemic cues and cloze phrase to repair communication breakdowns. Patient was able to follow simple one and two step commands and able to identify objects on visual communication board up to field of 10 with 100% accuracy. He was oriented to all concepts with the exception of year, by stating it was "29" with a confused look when SLP informed patient it is 2022. Patient unable to recall simple word list (chair, tie, apple) after short term delay (5 minutes) although successful with semantic cues. Able to recall 2/3 words after repetition and additional 5 minute delay. SLP spoke with patient's spouse on the phone who reported  patient's current speech, language, and cognition appears at baseline to her knowledge from prior CVA however expect some level of new  impairment secondary to recent CVA. Spouse reports difficulty understanding patient's speech. Patient would benefit from skilled SLP intervention to maximize speech intelligibility and communication strategies, safe swallow techniques, cognitive strategies (orientation), and overall functional independence prior to discharge.   Skilled Therapeutic Interventions          Administered a BSE, speech/language assessment, cognitive assessment. Please see above for details.  SLP Assessment  Patient will need skilled Speech Lanaguage Pathology Services during CIR admission    Recommendations  SLP Diet Recommendations: Dysphagia 3 (Mech soft);Thin Liquid Administration via: Straw;Cup Medication Administration: Whole meds with liquid Supervision: Patient able to self feed;Intermittent supervision to cue for compensatory strategies Compensations: Small sips/bites;Follow solids with liquid;Slow rate Postural Changes and/or Swallow  Maneuvers: Seated upright 90 degrees Oral Care Recommendations: Oral care BID Patient destination: Home Follow up Recommendations: 24 hour supervision/assistance Equipment Recommended: None recommended by SLP    SLP Frequency 3 to 5 out of 7 days   SLP Duration  SLP Intensity  SLP Treatment/Interventions 5-7 days  Minumum of 1-2 x/day, 30 to 90 minutes  Cognitive remediation/compensation;Speech/Language facilitation;Dysphagia/aspiration precaution training;Functional tasks;Patient/family education;Cueing hierarchy;Environmental controls;Internal/external aids    Pain Pain Assessment Pain Scale: 0-10 Pain Score: 0-No pain  Prior Functioning Baseline deficit details: Spouse reports residual speech and word finding deficits at baseline secondary to initial CVA. Type of Home: (P) House  Lives With: (P) Spouse Available Help at Discharge: (P) Family;Available 24 hours/day Vocation: (P) Retired  SLP Evaluation Cognition Overall Cognitive Status: History of cognitive impairments - at baseline Arousal/Alertness: Awake/alert Orientation Level: Oriented to person;Oriented to place;Disoriented to time;Other (comment) (reported year at "39") Attention: Sustained Sustained Attention: Appears intact Memory: Impaired Memory Impairment: Retrieval deficit;Decreased short term memory Decreased Short Term Memory: Verbal basic;Functional basic Immediate Memory Recall: Sock;Blue;Bed Memory Recall Sock: Not able to recall Memory Recall Blue: Without Cue Memory Recall Bed: Without Cue Awareness: Impaired Awareness Impairment: Emergent impairment Problem Solving: Appears intact Executive Function: Sequencing Sequencing: Appears intact Safety/Judgment: Appears intact Comments: Pt's cognition deficits made difficult to fully assess due to expressive aphasia and dysarthria; however, pt demo'd good problem solving, awareness, and sequencing during functional tasks assessed.   Comprehension Auditory Comprehension Overall Auditory Comprehension: Appears within functional limits for tasks assessed Yes/No Questions: Within Functional Limits Conversation: Simple EffectiveTechniques: Repetition Visual Recognition/Discrimination Discrimination: Within Function Limits Expression Verbal  Expression Overall Verbal Expression: Impaired Initiation: No impairment Level of Generative/Spontaneous Verbalization: Phrase Repetition: No impairment Naming: Impairment Responsive: 51-75% accurate Confrontation: Impaired Other Naming Comments: naming objects within category= 75% accurate Verbal Errors: Phonemic paraphasias;Aware of errors Pragmatics: No impairment Interfering Components: Speech intelligibility Effective Techniques:  (asking yes/no questions for clarification) Non-Verbal Means of Communication: Gestures Written Expression Dominant Hand: Right Written Expression: Not tested Oral Motor Oral Motor/Sensory Function Overall Oral Motor/Sensory Function: Moderate impairment Facial ROM: Reduced right Facial Symmetry: Abnormal symmetry right Facial Strength: Reduced right Lingual ROM: Reduced left;Reduced right Lingual Symmetry: Within Functional Limits Lingual Strength: Reduced Lingual Sensation: Reduced Velum: Within Functional Limits Mandible: Within Functional Limits Motor Speech Overall Motor Speech: Impaired Respiration: Within functional limits Phonation: Normal Resonance: Within functional limits Articulation: Impaired Level of Impairment: Word Intelligibility: Intelligibility reduced Word: 25-49% accurate Phrase: 0-24% accurate Sentence: 0-24% accurate Conversation: 0-24% accurate Motor Planning: Witnin functional limits Motor Speech Errors: Inconsistent Interfering Components: Premorbid status Effective Techniques: Slow rate;Increased vocal intensity;Over-articulate  Care Tool Care Tool Cognition Expression of Ideas and Wants Expression  of Ideas and Wants: Frequent difficulty - frequently exhibits difficulty with expressing needs and ideas   Understanding Verbal and Non-Verbal Content Understanding Verbal and Non-Verbal Content: Usually understands - understands most conversations, but misses some part/intent of message. Requires cues at times to understand   Memory/Recall Ability *first 3 days only Memory/Recall Ability *first 3 days only: Current season;That he or she is in a hospital/hospital unit     Bedside Swallowing Assessment General Date of Onset: 05/31/20 Previous Swallow Assessment: BSE 05/31/20 Diet Prior to this Study: Dysphagia 3 (soft);Thin liquids Temperature Spikes Noted: No Respiratory Status: Room air History of Recent Intubation: No Behavior/Cognition: Alert;Cooperative;Pleasant mood Oral Cavity - Dentition: Missing dentition;Dentures, top Self-Feeding Abilities: Needs set up Vision: Functional for self-feeding Patient Positioning: Upright in bed Baseline Vocal Quality: Normal Volitional Cough: Strong Volitional Swallow: Able to elicit  Oral Care Assessment Does patient have any of the following "high(er) risk" factors?: None of the above Does patient have any of the following "at risk" factors?: Tongue - coated Ice Chips Ice chips: Not tested Thin Liquid Thin Liquid: Within functional limits Presentation: Straw;Self Fed Nectar Thick Nectar Thick Liquid: Not tested Honey Thick Honey Thick Liquid: Not tested Puree Puree: Within functional limits Presentation: Spoon;Self Fed Solid Solid: Impaired Presentation: Self Fed Oral Phase Impairments: Impaired mastication Oral Phase Functional Implications: Oral residue BSE Assessment Suspected Esophageal Findings Suspected Esophageal Findings: (P) Belching Risk for Aspiration Impact on safety and function: Mild aspiration risk Other Related Risk Factors: Previous CVA  Short Term Goals: Week 1: SLP Short Term Goal 1 (Week 1): STG = LTG due  to ELOS  Refer to Care Plan for Long Term Goals  Recommendations for other services: None   Discharge Criteria: Patient will be discharged from SLP if patient refuses treatment 3 consecutive times without medical reason, if treatment goals not met, if there is a change in medical status, if patient makes no progress towards goals or if patient is discharged from hospital.  The above assessment, treatment plan, treatment alternatives and goals were discussed and mutually agreed upon: by patient  Patty Sermons 06/04/2020, 3:02 PM

## 2020-06-04 NOTE — Progress Notes (Signed)
Occupational Therapy Session Note  Patient Details  Name: Jeffery Roach MRN: [id number] Date of Birth: 1927-12-22  Today's Date: 06/04/2020 OT Individual Time: 7124-5809 OT Individual Time Calculation (min): 41 min    Short Term Goals: Week 1:  OT Short Term Goal 1 (Week 1): Pt will complete 3/3 toileting tasks with close spvsn using LRAD - toilet or BSC level. OT Short Term Goal 2 (Week 1): Pt will complete LB adls with close spvsn consistently. OT Short Term Goal 3 (Week 1): Pt will complete dynamic standing adl with no LOB using LRAD. OT Short Term Goal 4 (Week 1): Pt will use RUE as stabilizer in grooming task without cues for initiation.  Skilled Therapeutic Interventions: Pt received seated in recliner, agreeable to OT, and reporting no pain. Sit<>stands with RW close spvsn. Pt ambulated with RW from room<>ortho gym with close spvsn and no overt LOB; pt navigated obstacles in hallway safely without vc's. Noted R hand fell off RW multiple times, recommend a RW hand splint. Pt sat on therapy mat to complete multiple fine motor tasks to form baseline, see scores below. Pt unable to grasp pegs or 1" blocks with RUE. Further assessment of pt's RUE strength and ROM (see evaluation note for results); pt reported unrated pain in R hand following PROM in wrist/fingers. Pt education provided on preventing flexion contractures/positioning, strengthening exercises to perform in between therapies, and importance of incorporating RUE in tasks. Pt demo'd good understanding and provided with blue foam block. Upon return to room, pt reported desire to have R knee brace from home; assisted pt by calling wife and requesting she bring in. Pt doffed shoes with close spvsn EOB; sit>sup close spvsn. Pt positioned with pillow to promote RUE extension, remained supine, alarm set, call bell in hand, and all needs met.  Therapy Documentation Precautions:  Precautions Precautions: Fall Precaution Comments: R hemi,  sacral wound Restrictions Weight Bearing Restrictions: No Pain: Pain Assessment Pain Scale: 0-10 Pain Score:  (unrated) Pain Type: Acute pain Pain Location: Hand Pain Orientation: Right Pain Descriptors / Indicators: Aching Pain Onset: With Activity Pain Intervention(s): Repositioned;Distraction;Rest Multiple Pain Sites: No  Therapy/Group: Individual Therapy  Mellissa Kohut 06/04/2020, 4:12 PM

## 2020-06-04 NOTE — Evaluation (Signed)
Physical Therapy Assessment and Plan  Patient Details  Name: Jeffery Roach MRN: [id number] Date of Birth: 1927-03-11  PT Diagnosis: Abnormality of gait, Hemiplegia dominant and Muscle weakness Rehab Potential: Good ELOS: 7-9 days   Today's Date: 06/04/2020 PT Individual Time: 1300-1400 PT Individual Time Calculation (min): 60 min    Hospital Problem: Principal Problem:   Cerebellar stroke ([place]) Active Problems:   Right sided weakness   Dysarthria   Chronic combined systolic and diastolic congestive heart failure (HCC)   Chronic atrial fibrillation (HCC)   Right hemiparesis ([place])   Dysphagia, post-stroke   Past Medical History:  Past Medical History:  Diagnosis Date  . Acute on chronic systolic (congestive) heart failure ([place])   . Anemia due to stage 3 chronic kidney disease ([place])   . Arteriosclerotic coronary artery disease   . Atrial fibrillation ([place])   . Benign hypertension with chronic kidney disease, stage III ([place])   . Cerebral artery occlusion with cerebral infarction ([place])   . Cortical age-related cataract of both eyes   . Dermatochalasis of both upper eyelids   . Diabetes mellitus with stage 3 chronic kidney disease ([place])   . Gastroesophageal reflux disease without esophagitis   . History of heart attack   . Hypercalcemia   . Hyperlipidemia   . Hyperopia with astigmatism and presbyopia, bilateral   . Hypertension with goal to be determined   . Hyperthyroidism   . Hypokalemia   . LV dysfunction   . Malignant neoplasm of prostate ([place])   . Metabolic bone disease   . Myocardial infarction ([place])   . Nuclear sclerotic cataract of both eyes   . Personal history of rectal cancer   . Toxic multinodular goiter   . Toxic multinodular goiter   . Transient ischemic attack   . Type 2 diabetes mellitus with hyperglycemia ([place])   . Vitamin D deficiency   . Vitreous floaters of both eyes    Past Surgical History:  Past Surgical History:  Procedure Laterality Date   . CARDIAC SURGERY    . COLOPROCTECTOMY    . CORONARY ARTERY BYPASS GRAFT    . GASTROSTOMY TUBE PLACEMENT    . INGUINAL HERNIA REPAIR    . TONSILLECTOMY      Assessment & Plan Clinical Impression:Pt is a 87 y/o male admitted 05/30/20 with slurred speech. MRI reveals small acute infarct in bilateral cerebellum, R occipital cortex, and R pon; small remote Bil cerebellar infarcts, L remote L MCA branch infarct. MRA revealed no flow through R vertebral artery. PMH includes: CVA with residual R sided deficits and chronic expressive aphasia, afib on xarelto, rectal/prostate cancer, CKD< CAD s/p CABG 2005, CHF, HTN, DM 2. Patient transferred to CIR on 06/03/2020 .    Patient currently requires min with mobility secondary to muscle weakness, decreased coordination and decreased motor planning and decreased attention to right.  Prior to hospitalization, patient was independent  with mobility and lived with Spouse in a House home.  Home access is 2Stairs to enter.  Patient will benefit from skilled PT intervention to maximize safe functional mobility, minimize fall risk and decrease caregiver burden for planned discharge home with 24 hour supervision.  Anticipate patient will benefit from follow up [place] at discharge.  PT - End of Session Activity Tolerance: Tolerates 30+ min activity with multiple rests Endurance Deficit: Yes Endurance Deficit Description: mild SOB with ambulation, VSS, seated rest break needed PT Assessment Rehab Potential (ACUTE/IP ONLY): Good PT Patient demonstrates impairments in the following area(s):  Balance;Safety;Endurance;Motor;Perception PT Transfers Functional Problem(s): Bed Mobility;Bed to Chair;Car;Furniture PT Locomotion Functional Problem(s): Ambulation;Stairs PT Plan PT Intensity: Minimum of 1-2 x/day ,45 to 90 minutes PT Frequency: 5 out of 7 days PT Duration Estimated Length of Stay: 7-9 days PT Treatment/Interventions: Ambulation/gait training;Balance/vestibular  training;Community reintegration;Disease management/prevention;Functional electrical stimulation;Neuromuscular re-education;Patient/family education;Stair training;Skin care/wound management;Discharge planning;DME/adaptive equipment instruction;Functional mobility training;Splinting/orthotics;Therapeutic Activities;UE/LE Strength taining/ROM;Visual/perceptual remediation/compensation PT Transfers Anticipated Outcome(s): Supervision PT Locomotion Anticipated Outcome(s): Supervision with LRAD PT Recommendation Recommendations for Other Services: Therapeutic Recreation consult Therapeutic Recreation Interventions: Other (comment) (leisure activities for more active lifestyle) Follow Up Recommendations: Home health PT;24 hour supervision/assistance Patient destination: Home Equipment Recommended: Rolling walker with 5" wheels (patient may already have one)   PT Evaluation Precautions/Restrictions Precautions Precautions: Fall Precaution Comments: R hemi, sacral wound Pain Pain Assessment Pain Scale: 0-10 Pain Score:  (unrated) Pain Type: Acute pain Pain Location: Hand Pain Orientation: Right Pain Descriptors / Indicators: Aching Pain Onset: With Activity Pain Intervention(s): Repositioned;Distraction;Rest Multiple Pain Sites: No Home Living/Prior Functioning Home Living Available Help at Discharge: Family;Available 24 hours/day Type of Home: House Home Access: Stairs to enter [employer] of Steps: 2 Entrance Stairs-Rails: Left Home Layout: One level Bathroom Shower/Tub: Multimedia programmer: Standard  Lives With: Spouse Prior Function Level of Independence: Independent with basic ADLs;Independent with gait Driving: No Vocation: Retired Comments: independent with ADLs, mobility; spouse completes IADLs and med mgmt Vision/Perception  Perception Perception: Impaired Inattention/Neglect: Does not attend to right side of body Praxis Praxis: Intact   Cognition Overall Cognitive Status: History of cognitive impairments - at baseline Arousal/Alertness: Awake/alert Attention: Sustained Memory: Impaired Problem Solving: Appears intact Safety/Judgment: Appears intact Sensation Sensation Light Touch: Impaired by gross assessment Hot/Cold: Impaired by gross assessment Proprioception: Impaired by gross assessment Stereognosis: Impaired by gross assessment Additional Comments: decreased R attention Coordination Gross Motor Movements are Fluid and Coordinated: No Fine Motor Movements are Fluid and Coordinated: No Coordination and Movement Description: decreased coordination due to R UE weakness, B LE generalized weakness Finger Nose Finger Test: dysmetria noted in RUE 9 Hole Peg Test: LUE: 38 sec; RUE: unable; Box and Blocks: LUE: 33 blocks;  RUE: unable to complete Motor  Motor Motor: Hemiplegia;Motor apraxia Motor - Skilled Clinical Observations: R UE hemiplegia, decreased R side awareness/apraxia R UE, significant dysarthria   Trunk/Postural Assessment  Cervical Assessment Cervical Assessment: Exceptions to [facility] (forward head) Thoracic Assessment Thoracic Assessment: Exceptions to [facility] (rounded shoulders) Lumbar Assessment Lumbar Assessment: Exceptions to [facility] (stiffness throughout L spine) Postural Control Postural Control: Deficits on evaluation Protective Responses: delayed Postural Limitations: as noted above  Balance Balance Balance Assessed: Yes Standardized Balance Assessment Standardized Balance Assessment: Berg Balance Test Berg Balance Test Sit to Stand: Able to stand  independently using hands Standing Unsupported: Able to stand 2 minutes with supervision Sitting with Back Unsupported but Feet Supported on Floor or Stool: Able to sit safely and securely 2 minutes Stand to Sit: Controls descent by using hands Transfers: Able to transfer safely, definite need of hands Standing Unsupported with Eyes Closed: Able to  stand 10 seconds with supervision Standing Ubsupported with Feet Together: Able to place feet together independently and stand for 1 minute with supervision From Standing, Reach Forward with Outstretched Arm: Can reach forward >12 cm safely (5") From Standing Position, Pick up Object from Floor: Able to pick up shoe, needs supervision From Standing Position, Turn to Look Behind Over each Shoulder: Turn sideways only but maintains balance Turn 360 Degrees: Needs close supervision or verbal cueing  Standing Unsupported, Alternately Place Feet on Step/Stool: Able to complete >2 steps/needs minimal assist Standing Unsupported, One Foot in Front: Able to take small step independently and hold 30 seconds Standing on One Leg: Tries to lift leg/unable to hold 3 seconds but remains standing independently Total Score: 35 Static Sitting Balance Static Sitting - Balance Support: Feet supported;No upper extremity supported Static Sitting - Level of Assistance: 5: Stand by assistance Dynamic Sitting Balance Dynamic Sitting - Balance Support: No upper extremity supported;During functional activity;Feet supported Dynamic Sitting - Level of Assistance: 5: Stand by assistance Static Standing Balance Static Standing - Balance Support: No upper extremity supported;During functional activity Static Standing - Level of Assistance: 5: Stand by assistance Dynamic Standing Balance Dynamic Standing - Balance Support: No upper extremity supported;During functional activity;Right upper extremity supported;Left upper extremity supported Dynamic Standing - Level of Assistance: 4: Min assist Dynamic Standing - Balance Activities: Lateral lean/weight shifting;Forward lean/weight shifting;Reaching for objects Dynamic Standing - Comments: picking up item from floor Extremity Assessment  RUE Assessment RUE Assessment: Exceptions to [facility] Passive Range of Motion (PROM) Comments: <full range. ~100 degrees before pain; wrist  <full before pain Active Range of Motion (AROM) Comments: ~90 degrees shoulder FLX, elbow WFL, trace in wrist, finger flx WFL but <full range in ext General Strength Comments: 3- strength shoulder flx/abd, 4- elbow flx/ext, grip strength poor RUE Body System: Neuro Brunstrum levels for arm and hand: Arm;Hand Brunstrum level for arm: Stage IV Movement is deviating from synergy;Stage V Relative Independence from Synergy Brunstrum level for hand: Stage III Synergies performed voluntarily RUE AROM (degrees) Overall AROM Right Upper Extremity: Deficits RUE Strength Right Hand Gross Grasp: Impaired RUE Tone RUE Tone: Modified Ashworth Body Part - Modified Ashworth Scale: Elbow;Wrist;Fingers Elbow - Modified Ashworth Scale for Grading Hypertonia RUE: Slight increase in muscle tone, manifested by a catch and release or by minimal resistance at the end of the range of motion when the affected part(s) is moved in flexion or extension Wrist - Modified Ashworth Scale for Grading Hypertonia RUE: Slight increase in muscle tone, manifested by a catch and release or by minimal resistance at the end of the range of motion when the affected part(s) is moved in flexion or extension LUE Assessment LUE Assessment: Within Functional Limits RLE Assessment RLE Assessment: Exceptions to [facility] Active Range of Motion (AROM) Comments: [facility] General Strength Comments: hip flexion 3/5, knee extension 4/5, ankle DF 4-/5 LLE Assessment Active Range of Motion (AROM) Comments: WFL General Strength Comments: hip flexion 3+/5, knee extension 4+/5, ankle DF 4+/5  Care Tool Care Tool Bed Mobility Roll left and right activity   Roll left and right assist level: Supervision/Verbal cueing    Sit to lying activity   Sit to lying assist level: Supervision/Verbal cueing    Lying to sitting edge of bed activity   Lying to sitting edge of bed assist level: Supervision/Verbal cueing     Care Tool Transfers Sit to stand transfer    Sit to stand assist level: Contact Guard/Touching assist    Chair/bed transfer   Chair/bed transfer assist level: Contact Guard/Touching assist     Toilet transfer   Assist Level: Contact Guard/Touching assist    Car transfer   Car transfer assist level: Contact Guard/Touching assist      Care Tool Locomotion Ambulation   Assist level: Contact Guard/Touching assist Assistive device: Walker-rolling Max distance: 200  Walk 10 feet activity   Assist level: Contact Guard/Touching assist Assistive device: Walker-rolling   Walk  50 feet with 2 turns activity   Assist level: Contact Guard/Touching assist Assistive device: Walker-rolling  Walk 150 feet activity   Assist level: Contact Guard/Touching assist    Walk 10 feet on uneven surfaces activity   Assist level: Contact Guard/Touching assist Assistive device: Walker-rolling  Stairs   Assist level: Minimal Assistance - Patient > 75% Stairs assistive device: 1 hand rail Max number of stairs: 8  Walk up/down 1 step activity   Walk up/down 1 step (curb) assist level: Minimal Assistance - Patient > 75% Walk up/down 1 step or curb assistive device: 1 hand rail;2 hand rails    Walk up/down 4 steps activity Walk up/down 4 steps assist level: Minimal Assistance - Patient > 75% Walk up/down 4 steps assistive device: 1 hand rail;2 hand rails  Walk up/down 12 steps activity Walk up/down 12 steps activity did not occur: Safety/medical concerns      Pick up small objects from floor   Pick up small object from the floor assist level: Minimal Assistance - Patient > 75%    Wheelchair Will patient use wheelchair at discharge?: No          Wheel 50 feet with 2 turns activity Wheelchair 50 feet with 2 turns activity did not occur: N/A    Wheel 150 feet activity Wheelchair 150 feet activity did not occur: N/A      Refer to Care Plan for Long Term Goals  SHORT TERM GOAL WEEK 1 PT Short Term Goal 1 (Week 1): STG=LTG due to  ELOS  Recommendations for other services: Therapeutic Recreation  Other leisure activities for more active lifestyle  Skilled Therapeutic Intervention Patient in supine and agreeable to PT.  Performed supine to sit with S.  Patient seated and with assist to don compression socks and shoes with min to mod A.  Patient's wife and daughter present during session.  Patient sit to stand with CGA and transfer to w/c CGA without device.  Assisted in w/c to therapy gym for time management.  Patient sit to stand with S and ambulated x 80' without device with CGA to min A for safety/balance.  Patient negotiated 8 steps with L rail to occasional both rails with min A and cues for foot to clear steps.  Patient completed Berg balance assessment as noted below scored 35/56 and discussed fall risk.  Obtained walker for pt and pt ambulated 200' with RW and CGA watching R UE to make sure able to maintain grip on walker, cues for forward gaze and posture.  Patient negotiated ramp with RW and CGA.  Performed car transfer to simulated SUV height with CGA and cues.  Patient requesting to toilet so assisted in w/c to room and ambulated into bathroom standing to urinate with CGA.  Washed hands at sink with CGA and mod cues for including R hand.  Patient seated on EOB and doffed shoes with S.  Sit to supine with S and left with bed alarm active, wife and daughter in the room and call bell/needs in reach. Mobility Bed Mobility Bed Mobility: Sit to Supine;Supine to Sit Supine to Sit: Supervision/Verbal cueing Sit to Supine: Supervision/Verbal cueing Transfers Transfers: Sit to Stand;Stand to Sit;Stand Pivot Transfers Sit to Stand: Contact Guard/Touching assist Stand to Sit: Supervision/Verbal cueing Stand Pivot Transfers: Contact Guard/Touching assist Stand Pivot Transfer Details: Verbal cues for technique;Verbal cues for precautions/safety Transfer (Assistive device): None Locomotion  Gait Ambulation: Yes Gait  Assistance: Contact Guard/Touching assist Gait Distance (Feet): 200 Feet Assistive device:  Rolling walker Stairs / Additional Locomotion Stairs: Yes Stairs Assistance: Minimal Assistance - Patient > 75% Stair Management Technique: One rail Left;Two rails Number of Stairs: 8 Height of Stairs: 3 Ramp: Contact Guard/touching assist Wheelchair Mobility Wheelchair Mobility: No   Discharge Criteria: Patient will be discharged from PT if patient refuses treatment 3 consecutive times without medical reason, if treatment goals not met, if there is a change in medical status, if patient makes no progress towards goals or if patient is discharged from hospital.  The above assessment, treatment plan, treatment alternatives and goals were discussed and mutually agreed upon: by patient and by family  Jamison Oka, PT 06/04/2020, 4:32 PM

## 2020-06-04 NOTE — Evaluation (Signed)
Occupational Therapy Assessment and Plan  Patient Details  Name: Crit Obremski MRN: [id number] Date of Birth: 04/21/1927  OT Diagnosis: apraxia, cognitive deficits, hemiplegia affecting dominant side and muscle weakness (generalized) Rehab Potential: Rehab Potential (ACUTE ONLY): Good ELOS: 5-7 days   Today's Date: 06/04/2020 OT Individual Time: 7989-2119 OT Individual Time Calculation (min): 62 min     Hospital Problem: Principal Problem:   Cerebellar stroke ([place]) Active Problems:   Right sided weakness   Dysarthria   Chronic combined systolic and diastolic congestive heart failure (HCC)   Chronic atrial fibrillation (HCC)   Right hemiparesis ([place])   Dysphagia, post-stroke   Past Medical History:  Past Medical History:  Diagnosis Date  . Acute on chronic systolic (congestive) heart failure ([place])   . Anemia due to stage 3 chronic kidney disease ([place])   . Arteriosclerotic coronary artery disease   . Atrial fibrillation ([place])   . Benign hypertension with chronic kidney disease, stage III ([place])   . Cerebral artery occlusion with cerebral infarction ([place])   . Cortical age-related cataract of both eyes   . Dermatochalasis of both upper eyelids   . Diabetes mellitus with stage 3 chronic kidney disease ([place])   . Gastroesophageal reflux disease without esophagitis   . History of heart attack   . Hypercalcemia   . Hyperlipidemia   . Hyperopia with astigmatism and presbyopia, bilateral   . Hypertension with goal to be determined   . Hyperthyroidism   . Hypokalemia   . LV dysfunction   . Malignant neoplasm of prostate ([place])   . Metabolic bone disease   . Myocardial infarction ([place])   . Nuclear sclerotic cataract of both eyes   . Personal history of rectal cancer   . Toxic multinodular goiter   . Toxic multinodular goiter   . Transient ischemic attack   . Type 2 diabetes mellitus with hyperglycemia ([place])   . Vitamin D deficiency   . Vitreous floaters of both eyes    Past  Surgical History:  Past Surgical History:  Procedure Laterality Date  . CARDIAC SURGERY    . COLOPROCTECTOMY    . CORONARY ARTERY BYPASS GRAFT    . GASTROSTOMY TUBE PLACEMENT    . INGUINAL HERNIA REPAIR    . TONSILLECTOMY      Assessment & Plan Clinical Impression: Pt is a 87 y/o male admitted 05/30/20 with slurred speech. MRI reveals small acute infarct in bilateral cerebellum, R occipital cortex, and R pon; small remote Bil cerebellar infarcts, L remote L MCA branch infarct. MRA revealed no flow through R vertebral artery. PMH includes: CVA with residual R sided deficits and chronic expressive aphasia, afib on xarelto, rectal/prostate cancer, CKD< CAD s/p CABG 2005, CHF, HTN, DM 2. Patient transferred to CIR on 06/03/2020 .    Patient currently requires supervision-CGA/min guard with basic self-care skills secondary to muscle weakness, decreased cardiorespiratoy endurance, impaired timing and sequencing, unbalanced muscle activation, motor apraxia, decreased coordination and decreased motor planning, decreased motor planning, decreased initiation, decreased memory and delayed processing and decreased standing balance, decreased postural control, hemiplegia and decreased balance strategies.  Prior to hospitalization, patient could complete ADLs at independent level.  Patient will benefit from skilled intervention to increase independence with basic self-care skills prior to discharge home with care partner.  Anticipate patient will require 24 hour supervision and follow up outpatient.  OT - End of Session Activity Tolerance: Tolerates 30+ min activity with multiple rests Endurance Deficit: Yes OT Assessment Rehab Potential (ACUTE ONLY):  Good OT Barriers to Discharge: Decreased caregiver support;Home environment access/layout OT Barriers to Discharge Comments: pt's wife able to provide spvsn but not any physical assistance; pt reports walk in shower in bathroom is small/tight OT Patient  demonstrates impairments in the following area(s): Balance;Cognition;Endurance;Motor;Perception;Safety;Sensory OT Basic ADL's Functional Problem(s): Grooming;Bathing;Dressing;Toileting OT Advanced ADL's Functional Problem(s): Simple Meal Preparation OT Transfers Functional Problem(s): Toilet;Tub/Shower OT Additional Impairment(s): Fuctional Use of Upper Extremity OT Plan OT Intensity: Minimum of 1-2 x/day, 45 to 90 minutes OT Frequency: 5 out of 7 days OT Duration/Estimated Length of Stay: 7-10 days OT Treatment/Interventions: Balance/vestibular training;Discharge planning;Functional electrical stimulation;Self Care/advanced ADL retraining;Pain management;Therapeutic Activities;UE/LE Coordination activities;Cognitive remediation/compensation;Functional mobility training;Disease mangement/prevention;Patient/family education;Therapeutic Exercise;Visual/perceptual remediation/compensation;Community reintegration;DME/adaptive equipment instruction;Neuromuscular re-education;Psychosocial support;Splinting/orthotics;UE/LE Strength taining/ROM OT Self Feeding Anticipated Outcome(s): mod I OT Basic Self-Care Anticipated Outcome(s): mod I-spvsn OT Toileting Anticipated Outcome(s): mod I-spvsn OT Bathroom Transfers Anticipated Outcome(s): mod I-spvsn OT Recommendation Recommendations for Other Services: Therapeutic Recreation consult Therapeutic Recreation Interventions: Stress management;Outing/community reintergration Patient destination: Home Follow Up Recommendations: Outpatient OT Equipment Recommended: 3 in 1 bedside comode;Tub/shower bench   OT Evaluation Precautions/Restrictions  Precautions Precautions: Fall Precaution Comments: R hemi, sacral wound Restrictions Weight Bearing Restrictions: No General Chart Reviewed: Yes Response to Previous Treatment: Not applicable Family/Caregiver Present: No Pain Pain Assessment Pain Scale: 0-10 Pain Score: 0-No pain Home Living/Prior  Functioning Home Living Living Arrangements: Spouse/significant other Available Help at Discharge: Family,Available 24 hours/day Type of Home: House Home Access: Stairs to enter Technical brewer of Steps: 2 Home Layout: One level Bathroom Shower/Tub: Multimedia programmer: Programmer, systems: Yes  Lives With: Spouse IADL History Homemaking Responsibilities: No Occupation: Retired Leisure and Hobbies: checkers, poker IADL Comments: wife cooks and in charge of iADLs for pt Prior Function Level of Independence: Independent with basic ADLs,Independent with gait Driving: No Vocation: Retired Surveyor, mining Baseline Vision/History: Wears glasses Wears Glasses: At all times Patient Visual Report: No change from baseline Vision Assessment?: Vision impaired- to be further tested in functional context Additional Comments: Pt no longer reporting diplopia; able to read written words near and far away and correctly identify # of fingers presented; will continue to assess in functional contexts Perception  Perception: Impaired (depth perception appears slightly impaired) Praxis Praxis: Intact Cognition Overall Cognitive Status: History of cognitive impairments - at baseline Arousal/Alertness: Awake/alert Orientation Level: Person;Place;Situation Person: Oriented Place: Oriented Situation: Oriented Year: 2022 Month: May Day of Week: Incorrect (Pt thought it was Thursday) Memory: Impaired Memory Impairment: Retrieval deficit;Decreased short term memory Decreased Short Term Memory: Verbal basic;Functional basic Immediate Memory Recall: Sock;Blue;Bed Memory Recall Sock: Not able to recall Memory Recall Blue: Without Cue Memory Recall Bed: Without Cue Attention: Sustained Sustained Attention: Appears intact Awareness: Impaired Awareness Impairment: Emergent impairment Problem Solving: Appears intact Executive Function: Sequencing Sequencing: Appears  intact Safety/Judgment: Appears intact Comments: Pt's cognition deficits made difficult to fully assess due to expressive aphasia and dysarthria; however, pt demo'd good problem solving, awareness, and sequencing during functional tasks assessed. Sensation Sensation Light Touch: Impaired by gross assessment Hot/Cold: Impaired by gross assessment Proprioception: Impaired by gross assessment Stereognosis: Impaired by gross assessment Additional Comments: Sensation deficits in RUE; will continue to assess and treat. Coordination Gross Motor Movements are Fluid and Coordinated: No Fine Motor Movements are Fluid and Coordinated: No (LUE FM coordinated; RUE FM limits due to R hemi) Coordination and Movement Description: decreased balance and coordination during ambulation, standing balance tasks, and transfers Finger Nose Finger Test: dysmetria noted 9 hole peg: LUE: 38 sec, RUE:  unable; Box and Blocks: LUE: 33 blocks; RUE: unable to complete Motor  Motor Motor: Hemiplegia;Motor apraxia Motor - Skilled Clinical Observations: RUE hemiplegia and motor apraxia during functional tasks noted  Trunk/Postural Assessment  Cervical Assessment Cervical Assessment: Exceptions to [facility] (forward head) Thoracic Assessment Thoracic Assessment: Within Functional Limits Lumbar Assessment Lumbar Assessment: Within Functional Limits Postural Control Postural Control: Deficits on evaluation (static and dynamic standing balance and postural control in ambulation)  Balance Balance Balance Assessed: Yes Static Sitting Balance Static Sitting - Balance Support: Feet supported;No upper extremity supported Static Sitting - Level of Assistance: 5: Stand by assistance Dynamic Sitting Balance Dynamic Sitting - Balance Support: No upper extremity supported;During functional activity;Feet supported Dynamic Sitting - Level of Assistance: 5: Stand by assistance Dynamic Sitting - Balance Activities: Lateral lean/weight  shifting;Forward lean/weight shifting;Reaching across midline;Reaching for objects Static Standing Balance Static Standing - Balance Support: No upper extremity supported;During functional activity Static Standing - Level of Assistance: 5: Stand by assistance Dynamic Standing Balance Dynamic Standing - Balance Support: No upper extremity supported;During functional activity;Right upper extremity supported;Left upper extremity supported Dynamic Standing - Level of Assistance: 5: Stand by assistance;4: Min assist Dynamic Standing - Balance Activities: Lateral lean/weight shifting;Forward lean/weight shifting;Reaching for objects Extremity/Trunk Assessment RUE Assessment RUE Assessment: (P) Exceptions to [facility] Active Range of Motion (AROM) Comments: (P) ~90 degrees shoulder FLX General Strength Comments: (P) 2/5 grip strength RUE Body System: Neuro Brunstrum levels for arm and hand: Arm;Hand RUE AROM (degrees) Overall AROM Right Upper Extremity: (P) Deficits RUE Strength Right Hand Gross Grasp: (P) Impaired RUE Tone RUE Tone: (P) Modified Ashworth Body Part - Modified Ashworth Scale: Elbow;Wrist;Fingers LUE Assessment LUE Assessment: Within Functional Limits  Care Tool Care Tool Self Care Eating   Eating Assist Level: Set up assist    Oral Care    Oral Care Assist Level: Supervision/Verbal cueing    Bathing   Body parts bathed by patient: Right arm;Left arm;Chest;Abdomen;Front perineal area;Buttocks;Right upper leg;Left upper leg;Right lower leg;Left lower leg;Face     Assist Level: Supervision/Verbal cueing    Upper Body Dressing(including orthotics)   What is the patient wearing?: Pull over shirt   Assist Level: Set up assist    Lower Body Dressing (excluding footwear)   What is the patient wearing?: Underwear/pull up;Pants Assist for lower body dressing: Contact Guard/Touching assist    Putting on/Taking off footwear   What is the patient wearing?: Non-skid slipper  socks Assist for footwear: Supervision/Verbal cueing       Care Tool Toileting Toileting activity         Care Tool Bed Mobility Roll left and right activity        Sit to lying activity   Sit to lying assist level: Supervision/Verbal cueing    Lying to sitting edge of bed activity   Lying to sitting edge of bed assist level: Supervision/Verbal cueing     Care Tool Transfers Sit to stand transfer   Sit to stand assist level: Supervision/Verbal cueing    Chair/bed transfer   Chair/bed transfer assist level: Contact Guard/Touching assist     Toilet transfer   Assist Level: Contact Guard/Touching assist     Care Tool Cognition Expression of Ideas and Wants Expression of Ideas and Wants: Frequent difficulty - frequently exhibits difficulty with expressing needs and ideas   Understanding Verbal and Non-Verbal Content Understanding Verbal and Non-Verbal Content: Usually understands - understands most conversations, but misses some part/intent of message. Requires cues at times to understand  Memory/Recall Ability *first 3 days only Memory/Recall Ability *first 3 days only: Current season;That he or she is in a hospital/hospital unit    Refer to Care Plan for [place] 1 OT Short Term Goal 1 (Week 1): Pt will complete 3/3 toileting tasks with close spvsn using LRAD - toilet or BSC level. OT Short Term Goal 2 (Week 1): Pt will complete LB adls with close spvsn consistently. OT Short Term Goal 3 (Week 1): Pt will complete dynamic standing adl with no LOB using LRAD. OT Short Term Goal 4 (Week 1): Pt will use RUE as stabilizer in grooming task without cues for initiation.  Recommendations for other services: Therapeutic Recreation  Stress management and Outing/community reintegration   Skilled Therapeutic Intervention Pt received supine in bed, agreeable to OT eval, no pain reported. Education on role of OT, POC, and some safety precautions during  functional activities and at home were provided with pt demo'ing understanding; safety and home set up will need reinforcement. Pt completed ADLs/mobility as described below. During ambulation with no AD, pt was offered therapist hand to hold, but after a few steps pushed hand away and wanted to walk on own; demo'd fair balance in ambulation but was unsteady. Pt completed oral hygiene standing at sink following shower and dressing; pt asked therapist for help to open toothpaste and place on toothbrush, not using RUE. VC's and education provided on use of RUE as a stabilizer in functional tasks. Pt held toothpaste in R hand to unscrew cap, but then required min A to place on toothbrush as he did not use R hand despite cues. Pt demo'd sensation deficits in RUE when feeling temperature of water (was hot but reported cold). Pt is a good candidate for CIR to increase safe independence in BADLs and functional mobility for safe d/c home as well as to increase functional use of RUE. Pt remained supine in bed, alarm set, call bell in reach, and all needs met.   ADL ADL Eating: Set up Where Assessed-Eating: Bed level Grooming: Supervision/safety Where Assessed-Grooming: Standing at sink Upper Body Bathing: Supervision/safety Where Assessed-Upper Body Bathing: Shower;Other (Comment) (TTB) Lower Body Bathing: Supervision/safety Where Assessed-Lower Body Bathing: Shower;Other (Comment) (seated and standing) Upper Body Dressing: Setup Where Assessed-Upper Body Dressing: Edge of bed Lower Body Dressing: Supervision/safety;Contact guard Where Assessed-Lower Body Dressing: Edge of bed;Other (Comment) (seated and standing no AD) Toileting: Not assessed (pt has been using urinal in bed) Toilet Transfer: Contact guard;Close supervision Toilet Transfer Method: Counselling psychologist: Energy manager: Close supervision;Contact guard Social research officer, government Method: Conservation officer, historic buildings: Transfer tub bench;Grab bars ADL Comments: Standing ADLs and transfers requiring intermittent CGA due to poor dynamic and static standing balance Mobility  Bed Mobility Bed Mobility: Sit to Supine;Supine to Sit Supine to Sit: Supervision/Verbal cueing Sit to Supine: Supervision/Verbal cueing Transfers Sit to Stand: Supervision/Verbal cueing Stand to Sit: Supervision/Verbal cueing   Discharge Criteria: Patient will be discharged from OT if patient refuses treatment 3 consecutive times without medical reason, if treatment goals not met, if there is a change in medical status, if patient makes no progress towards goals or if patient is discharged from hospital.  The above assessment, treatment plan, treatment alternatives and goals were discussed and mutually agreed upon: by patient  Mellissa Kohut 06/04/2020, 12:12 PM

## 2020-06-04 NOTE — Progress Notes (Signed)
Inpatient [facility] Individual Statement of Services  Patient Name:  Jeffery Roach  Date:  06/04/2020  Welcome to the [facility].  Our goal is to provide you with an individualized program based on your diagnosis and situation, designed to meet your specific needs.  With this comprehensive rehabilitation program, you will be expected to participate in at least 3 hours of rehabilitation therapies Monday-Friday, with modified therapy programming on the weekends.  Your rehabilitation program will include the following services:  Physical Therapy (PT), Occupational Therapy (OT), Speech Therapy (ST), 24 hour per day rehabilitation nursing, Care Coordinator, Rehabilitation Medicine, Nutrition Services and Pharmacy Services  Weekly team conferences will be held on Wednesday to discuss your progress.  Your Inpatient Rehabilitation Care Coordinator will talk with you frequently to get your input and to update you on team discussions.  Team conferences with you and your family in attendance may also be held.  Expected length of stay: 7-8 days Overall anticipated outcome: supervision level  Depending on your progress and recovery, your program may change. Your Inpatient Rehabilitation Care Coordinator will coordinate services and will keep you informed of any changes. Your Inpatient Rehabilitation Care Coordinator's name and contact numbers are listed  below.  The following services may also be recommended but are not provided by the [facility]:    [place] will be made to provide these services after discharge if needed.  Arrangements include referral to agencies that provide these services.  Your insurance has been verified to be:  Medicare & [place] Your primary doctor is:  Ival Bible  Pertinent information will be shared with your doctor and your insurance  company.  Inpatient Rehabilitation Care Coordinator:  Ovidio Kin, [place] or Emilia Beck  Information discussed with and copy given to patient by: Elease Hashimoto, 06/04/2020, 10:54 AM

## 2020-06-04 NOTE — Progress Notes (Signed)
[facility] PHYSICAL MEDICINE & REHABILITATION PROGRESS NOTE  Subjective/Complaints: Patient seen sitting up at the edge of his bed this morning, working with therapies.  Good sitting balance noted.  He states he slept well overnight.  He is ready to begin therapies.  ROS: Denies CP, SOB, N/V/D  Objective: Vital Signs: Blood pressure (!) 156/88, pulse 73, temperature 99.3 F (37.4 C), temperature source Oral, resp. rate 18, height 6\' 3"  (1.905 m), weight 105 kg, SpO2 96 %. No results found. Recent Labs    06/03/20 0306 06/04/20 0610  WBC 9.9 9.3  HGB 13.8 14.5  HCT 43.2 45.3  PLT 197 187   Recent Labs    06/03/20 0306 06/04/20 0610  NA 136 137  K 4.3 4.4  CL 107 107  CO2 24 20*  GLUCOSE 146* 123*  BUN 32* 29*  CREATININE 1.97* 1.92*  CALCIUM 9.4 9.6    Intake/Output Summary (Last 24 hours) at 06/04/2020 1020 Last data filed at 06/04/2020 0914 Gross per 24 hour  Intake 780 ml  Output 1775 ml  Net -995 ml     Pressure Injury 05/31/20 Coccyx Mid Stage 2 -  Partial thickness loss of dermis presenting as a shallow open injury with a red, pink wound bed without slough. area of breakdown just above rectum in gluteal fold (Active)  05/31/20 0240  Location: Coccyx  Location Orientation: Mid  Staging: Stage 2 -  Partial thickness loss of dermis presenting as a shallow open injury with a red, pink wound bed without slough.  Wound Description (Comments): area of breakdown just above rectum in gluteal fold  Present on Admission: Yes    Physical Exam: BP (!) 156/88 (BP Location: Right Arm)   Pulse 73   Temp 99.3 F (37.4 C) (Oral)   Resp 18   Ht 6\' 3"  (1.905 m)   Wt 105 kg   SpO2 96%   BMI 28.93 kg/m  Constitutional: No distress . Vital signs reviewed. HENT: Normocephalic.  Atraumatic. Eyes: EOMI. No discharge. Cardiovascular: No JVD.  Irregularly irregular. Respiratory: Normal effort.  No stridor.  Bilateral clear to auscultation. GI: Non-distended.  BS +.  +  Colostomy. Skin: Warm and dry.  Intact. Psych: Normal mood.  Normal behavior. Musc: No edema in extremities.  No tenderness in extremities. Neuro: Alert and oriented Right facial weakness Expressive aphasia Dysarthria Motor: RUE: Shoulder abduction 2+/5, distally 3 -/5 RLE: 4/5 proximal to distal  Assessment/Plan: 1. Functional deficits which require 3+ hours per day of interdisciplinary therapy in a comprehensive inpatient rehab setting.  Physiatrist is providing close team supervision and 24 hour management of active medical problems listed below.  Physiatrist and rehab team continue to assess barriers to discharge/monitor patient progress toward functional and medical goals   Care Tool:  Bathing              Bathing assist       Upper Body Dressing/Undressing Upper body dressing        Upper body assist Assist Level: Set up assist    Lower Body Dressing/Undressing Lower body dressing            Lower body assist Assist for lower body dressing: Minimal Assistance - Patient > 75%     Toileting Toileting    Toileting assist Assist for toileting: (P) Independent with assistive device Assistive Device Comment: (P) urinal   Transfers Chair/bed transfer  Transfers assist           Locomotion Ambulation  Ambulation assist              Walk 10 feet activity   Assist           Walk 50 feet activity   Assist           Walk 150 feet activity   Assist           Walk 10 feet on uneven surface  activity   Assist           Wheelchair     Assist               Wheelchair 50 feet with 2 turns activity    Assist            Wheelchair 150 feet activity     Assist           Medical Problem List and Plan: 1. Right hemiparesis, dysarthria and aphasiasecondary to small acute infarct in bilateral cerebellum, right occipital cortex and possibly right pons  Begin CIR evaluations  2.  Antithrombotics: -DVT/anticoagulation:Pharmaceutical:Other (comment)--now on Eliquis renal dose -antiplatelet therapy: low dose ASA 3. Pain Management:N/A 4. Mood:LCSW to follow for evaluation and support. -antipsychotic agents: N/a 5. Neuropsych: This patientmaybeiscapable of making decisions onhisown behalf. 6. Skin/Wound Care:Routine pressure relief measures 7. Fluids/Electrolytes/Nutrition:strict I/Os.  8. Chronic A fib: On amiodarone for NSVT/rate control (had bradycardia with BB) Monitor HR TID and follow up with Dr. Terrence Dupont after d/c  Monitor with increased exertion 9. T2DM with hyperglycemia:   Hgb A1C pending  Discussed with pharmacy, dapagligozin on hold due to kidney function.   Hold glipizide due to CKD. --Monitor BS ac/hs and continue SSI for elevated BS.  --Carb modified/heart healthy restrictions added.  Monitor with increased mobility 10. CKDIIIb:   Creatinine 1.92 on 6/3, continue to monitor 11.CAD s/p CABG/ICM with EF 40-45%: Monitor for signs of overload.  Was on dapaglifozin(resume for cardiac/DM), Lasix and atorvastatin PTA.  Filed Weights   06/03/20 1718 06/04/20 0443  Weight: 104.9 kg 105 kg   13. Constipation: Added 1senna S at bedtime to help soften stools.  Increase bowel meds as necessary 14.  Post stroke dysphagia  D3 thins, advance diet as tolerated   LOS: 1 days A FACE TO FACE EVALUATION WAS PERFORMED  Angela Platner Lorie Phenix 06/04/2020, 10:20 AM

## 2020-06-04 NOTE — Plan of Care (Signed)
  Problem: RH Balance Goal: LTG Patient will maintain dynamic standing balance (PT) Description: LTG:  Patient will maintain dynamic standing balance with assistance during mobility activities (PT) Flowsheets (Taken 06/04/2020 1652) LTG: Pt will maintain dynamic standing balance during mobility activities with:: Supervision/Verbal cueing   Problem: Sit to Stand Goal: LTG:  Patient will perform sit to stand with assistance level (PT) Description: LTG:  Patient will perform sit to stand with assistance level (PT) Flowsheets (Taken 06/04/2020 1652) LTG: PT will perform sit to stand in preparation for functional mobility with assistance level: Supervision/Verbal cueing   Problem: RH Bed Mobility Goal: LTG Patient will perform bed mobility with assist (PT) Description: LTG: Patient will perform bed mobility with assistance, with/without cues (PT). Flowsheets (Taken 06/04/2020 1652) LTG: Pt will perform bed mobility with assistance level of: Independent   Problem: RH Bed to Chair Transfers Goal: LTG Patient will perform bed/chair transfers w/assist (PT) Description: LTG: Patient will perform bed to chair transfers with assistance (PT). Flowsheets (Taken 06/04/2020 1652) LTG: Pt will perform Bed to Chair Transfers with assistance level: Supervision/Verbal cueing   Problem: RH Car Transfers Goal: LTG Patient will perform car transfers with assist (PT) Description: LTG: Patient will perform car transfers with assistance (PT). Flowsheets (Taken 06/04/2020 1652) LTG: Pt will perform car transfers with assist:: Supervision/Verbal cueing   Problem: RH Ambulation Goal: LTG Patient will ambulate in controlled environment (PT) Description: LTG: Patient will ambulate in a controlled environment, # of feet with assistance (PT). Flowsheets (Taken 06/04/2020 1652) LTG: Pt will ambulate in controlled environ  assist needed:: Supervision/Verbal cueing LTG: Ambulation distance in controlled environment: 200' w/  LRAD Goal: LTG Patient will ambulate in home environment (PT) Description: LTG: Patient will ambulate in home environment, # of feet with assistance (PT). Flowsheets (Taken 06/04/2020 1652) LTG: Pt will ambulate in home environ  assist needed:: Supervision/Verbal cueing LTG: Ambulation distance in home environment: 51' w/LRAD   Problem: RH Stairs Goal: LTG Patient will ambulate up and down stairs w/assist (PT) Description: LTG: Patient will ambulate up and down # of stairs with assistance (PT) Flowsheets (Taken 06/04/2020 1652) LTG: Pt will ambulate up/down stairs assist needed:: Supervision/Verbal cueing LTG: Pt will  ambulate up and down number of stairs:  at least 2 with L rail for home entry  12 with rails for community access  Magda Kiel, PT

## 2020-06-04 NOTE — Progress Notes (Signed)
Inpatient Rehabilitation Care Coordinator Assessment and Plan Patient Details  Name: Jeffery Roach MRN: [id number] Date of Birth: 24-May-1927  Today's Date: 06/04/2020  Hospital Problems: Principal Problem:   Cerebellar stroke [facility]) Active Problems:   Right sided weakness   Dysarthria   Chronic combined systolic and diastolic congestive heart failure (HCC)   Chronic atrial fibrillation (HCC)   Right hemiparesis ([place])   Dysphagia, post-stroke  Past Medical History:  Past Medical History:  Diagnosis Date  . Acute on chronic systolic (congestive) heart failure ([place])   . Anemia due to stage 3 chronic kidney disease ([place])   . Arteriosclerotic coronary artery disease   . Atrial fibrillation ([place])   . Benign hypertension with chronic kidney disease, stage III ([place])   . Cerebral artery occlusion with cerebral infarction ([place])   . Cortical age-related cataract of both eyes   . Dermatochalasis of both upper eyelids   . Diabetes mellitus with stage 3 chronic kidney disease ([place])   . Gastroesophageal reflux disease without esophagitis   . History of heart attack   . Hypercalcemia   . Hyperlipidemia   . Hyperopia with astigmatism and presbyopia, bilateral   . Hypertension with goal to be determined   . Hyperthyroidism   . Hypokalemia   . LV dysfunction   . Malignant neoplasm of prostate ([place])   . Metabolic bone disease   . Myocardial infarction ([place])   . Nuclear sclerotic cataract of both eyes   . Personal history of rectal cancer   . Toxic multinodular goiter   . Toxic multinodular goiter   . Transient ischemic attack   . Type 2 diabetes mellitus with hyperglycemia ([place])   . Vitamin D deficiency   . Vitreous floaters of both eyes    Past Surgical History:  Past Surgical History:  Procedure Laterality Date  . CARDIAC SURGERY    . COLOPROCTECTOMY    . CORONARY ARTERY BYPASS GRAFT    . GASTROSTOMY TUBE PLACEMENT    . INGUINAL HERNIA REPAIR    . TONSILLECTOMY     Social  History:  reports that he has quit smoking. He has never used smokeless tobacco. No history on file for alcohol use and drug use.  Family / Support Systems Marital Status: Married Patient Roles: Spouse,Parent Spouse/Significant Other: Louise [phone] Children: Tyrell-son [phone]  Gail-daughter [phone] Other Supports: Some extended family Anticipated Caregiver: Wife and children Ability/Limitations of Caregiver: Baker Janus works for [employer] and wife is elderly herself-needs to be supervision level Caregiver Availability: 24/7 Family Dynamics: Close knit family who are willing to assist Dad and have provided assist after first CVA. Wife will be there with him as his primary caregiver. All hope he will get mobile while here  Social History Preferred language: English Religion:  Cultural Background: NO issues Education: Some schooling Read: Yes Write: Yes Employment Status: Retired Public relations account executive Issues: No issues Guardian/Conservator: None-according to MD pt is not fully capable of making his own decisions, will look toward his wife and children to make any decisions while here   Abuse/Neglect Abuse/Neglect Assessment Can Be Completed: Yes Physical Abuse: Denies Verbal Abuse: Denies Sexual Abuse: Denies Exploitation of patient/patient's resources: Denies Self-Neglect: Denies  Emotional Status Pt's affect, behavior and adjustment status: Pt is not able to participate due to speech/language issues from first stroke. He was mod/i physically after first CVA. Wife voiced he did well and mowed the yard. Walked down the street for exercise. Recent Psychosocial Issues: first CVA, and other health issues-cancer-has  ostomy manages at home Psychiatric History: No history deferred depression screen due to speehc and language deficits and adjusting to rehab. Wife voiced: " He is back to baseline with speech issues." Substance Abuse History: No issues  Patient / Family  Perceptions, Expectations & Goals Pt/Family understanding of illness & functional limitations: Pt seems to have a basic understanding of his stroke and deficits-his wife can explain his stroke and new deficits. She has spoken with the MD and feels has a good understanding of his treatment plan going forward. Premorbid pt/family roles/activities: Husband, father, grandfather, retiree, church member, etc Anticipated changes in roles/activities/participation: resume Pt/family expectations/goals: Pt states: "I'm alright."  Wife states: " I hope he can be mobile like he was I am 104 lbs and can't do physical care."  [employer]: Other (Comment) (Been to [place] in the past) Premorbid Home Care/DME Agencies: Other (Comment) (has had HH-[facility] OP-[facility] and a cane) Transportation available at discharge: Wife and daughter who works  Dentist Living Arrangements: Spouse/significant other [place]: Spouse/significant other,Children,Other relatives,Church/faith community Type of Residence: Private residence Administrator, 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 Their daughter works first shift and can check on in the evenings. Made aware evaluating today and setting goals. Wife doesn;t come daily due to cost of gas, but between she and daughter someone visits daily. Care Coordinator Barriers to Discharge: Lack of/limited family support Care Coordinator Barriers to Discharge Comments: wife elderly and small stature Care Coordinator Anticipated Follow Up Needs: HH/OP  Clinical  Impression Pleasant gentleman who is motivated to recover from this stroke. He was managing prior to admission after first stroke. His main deficits was his speech but was mobile and could ambulate with no assistive device. Their children are involved and supportive and will do what they can for parents. Aware evaluating and setting goals along with ELOS. Will work on discharge needs.  Elease Hashimoto 06/04/2020, 10:52 AM

## 2020-06-05 LAB — GLUCOSE, CAPILLARY
Glucose-Capillary: 123 mg/dL — ABNORMAL HIGH (ref 70–99)
Glucose-Capillary: 143 mg/dL — ABNORMAL HIGH (ref 70–99)
Glucose-Capillary: 195 mg/dL — ABNORMAL HIGH (ref 70–99)
Glucose-Capillary: 137 mg/dL — ABNORMAL HIGH (ref 70–99)

## 2020-06-05 NOTE — Progress Notes (Signed)
Occupational Therapy Session Note  Patient Details  Name: Jeffery Roach MRN: [id number] Date of Birth: September 05, 1927  Today's Date: 06/05/2020 OT Individual Time: 9518-8416 OT Individual Time Calculation (min): 57 min    Short Term Goals: Week 1:  OT Short Term Goal 1 (Week 1): Pt will complete 3/3 toileting tasks with close spvsn using LRAD - toilet or BSC level. OT Short Term Goal 2 (Week 1): Pt will complete LB adls with close spvsn consistently. OT Short Term Goal 3 (Week 1): Pt will complete dynamic standing adl with no LOB using LRAD. OT Short Term Goal 4 (Week 1): Pt will use RUE as stabilizer in grooming task without cues for initiation.  Skilled Therapeutic Interventions/Progress Updates:    Treatment session with focus on LB dressing, functional transfers, and use of RUE as stabilizer.  Pt received supine in bed agreeable to therapy session.  Pt declined bathing but donned pants seated EOB with CGA for sit > stand and standing balance.  Pt ambulated to sink without AD with CGA to complete oral care.  Therapist encouraged pt to utilize RUE as stabilizer, requiring increased cues and encouragement.  Pt able to maintain loose grasp on toothpaste while screwing cap back on.  Pt's breakfast arrived and pt expressing desire to eat now.  Pt ambulated back to recliner with CGA without AD.  Therapist encouraging pt to utilize RUE as stabilizer, however pt refusing.  Pt ambulated to Dayroom with RW with hand over hand assist to maintain grasp on RW.  Engaged in cup stacking and rotating in sitting with focus on forced use of RUE.  Pt demonstrating good opening and closing of R hand, however requires increased time and focused attention to task to manipulate cups.  Pt able to stack cups, but unable to rotate.  Pt ambulated back to room with CGA without AD with focus on increased arm swing.  Therapy Documentation Precautions:  Precautions Precautions: Fall Precaution Comments: R hemi, sacral  wound Restrictions Weight Bearing Restrictions: No General:   Vital Signs: Therapy Vitals Temp: 98.4 F (36.9 C) Pulse Rate: (!) 50 Resp: 16 BP: (!) 159/72 Patient Position (if appropriate): Lying Oxygen Therapy SpO2: 97 % O2 Device: Room Air Pain:   ADL: ADL Eating: Set up Where Assessed-Eating: Bed level Grooming: Supervision/safety Where Assessed-Grooming: Standing at sink Upper Body Bathing: Supervision/safety Where Assessed-Upper Body Bathing: Shower,Other (Comment) (TTB) Lower Body Bathing: Supervision/safety Where Assessed-Lower Body Bathing: Shower,Other (Comment) (seated and standing) Upper Body Dressing: Setup Where Assessed-Upper Body Dressing: Edge of bed Lower Body Dressing: Supervision/safety,Contact guard Where Assessed-Lower Body Dressing: Edge of bed,Other (Comment) (seated and standing no AD) Toileting: Not assessed (pt has been using urinal in bed) Toilet Transfer: Contact guard,Close supervision Toilet Transfer Method: Counselling psychologist: Energy manager: Close supervision,Contact guard Social research officer, government Method: Heritage manager: Transfer tub bench,Grab bars ADL Comments: Standing ADLs and transfers requiring intermittent CGA due to poor dynamic and static standing balance Vision   Perception    Praxis   Exercises:   Other Treatments:     Therapy/Group: Individual Therapy  Simonne Come 06/05/2020, 7:49 AM

## 2020-06-05 NOTE — Progress Notes (Signed)
Physical Therapy Session Note  Patient Details  Name: Jeffery Roach MRN: [id number] Date of Birth: 06-03-1927  Today's Date: 06/05/2020 PT Individual Time: 8413-2440 PT Individual Time Calculation (min): 45 min   Short Term Goals: Week 1:  PT Short Term Goal 1 (Week 1): STG=LTG due to ELOS  Skilled Therapeutic Interventions/Progress Updates:    pt received in recliner and agreeable to therapy. Pt directed in gait training no AD 150' x2 CGA with VC for increased step length and height on RLE. Pt directed in toe taps on 3" box with one UE support at walker pt able to complete CGA with alternating pattern for improved reciprocal mobility and dynamic balance VC for trunk to midline to decrease compensatory strategies. Pt directed in dynamic balance training without AD for understand bean bag toss with target 4' away CGA 2x20 with RUE used to grasp bean bags and toss to board with pt demonstrated fair ability to complete task though with fatigue on second set pt used LUE at [facility] for final 5 reps. Pt required rest breaks intermittently 2/2 faitgue. Pt returned to room CGA, listed above. Pt left in recliner, All needs in reach and in good condition. Call light in hand.  And alarm set. Pt continued to demonstrate expressive aphasia throughout session, extra time and multiple attempts pt able to effectively expressive self ~50% of the time.   Therapy Documentation Precautions:  Precautions Precautions: Fall Precaution Comments: R hemi, sacral wound Restrictions Weight Bearing Restrictions: No General:   Vital Signs: Therapy Vitals Temp: 97.7 F (36.5 C) Pulse Rate: 64 Resp: 17 BP: 135/75 Patient Position (if appropriate): Sitting Oxygen Therapy SpO2: 100 % O2 Device: Room Air Pain:   Mobility:   Locomotion :    Trunk/Postural Assessment :    Balance:   Exercises:   Other Treatments:      Therapy/Group: Individual Therapy  Junie Panning 06/05/2020, 3:50 PM

## 2020-06-05 NOTE — Progress Notes (Signed)
Jeffery Roach PHYSICAL MEDICINE & REHABILITATION PROGRESS NOTE  Subjective/Complaints: Doing great ambulating with Jeffery Roach! Would like to go home Biggest deficit currently appears to be expressive aphasia- he tries hard, pleasant despite his difficulty in communicating  ROS: Denies CP, SOB, N/V/D  Objective: Vital Signs: Blood pressure 135/75, pulse 64, temperature 97.7 F (36.5 C), resp. rate 17, height 6\' 3"  (1.905 m), weight 103.8 kg, SpO2 100 %. No results found. Recent Labs    06/03/20 0306 06/04/20 0610  WBC 9.9 9.3  HGB 13.8 14.5  HCT 43.2 45.3  PLT 197 187   Recent Labs    06/03/20 0306 06/04/20 0610  NA 136 137  K 4.3 4.4  CL 107 107  CO2 24 20*  GLUCOSE 146* 123*  BUN 32* 29*  CREATININE 1.97* 1.92*  CALCIUM 9.4 9.6    Intake/Output Summary (Last 24 hours) at 06/05/2020 1717 Last data filed at 06/05/2020 1300 Gross per 24 hour  Intake 818 ml  Output 1625 ml  Net -807 ml     Pressure Injury 05/31/20 Coccyx Mid Stage 2 -  Partial thickness loss of dermis presenting as a shallow open injury with a red, pink wound bed without slough. area of breakdown just above rectum in gluteal fold (Active)  05/31/20 0240  Location: Coccyx  Location Orientation: Mid  Staging: Stage 2 -  Partial thickness loss of dermis presenting as a shallow open injury with a red, pink wound bed without slough.  Wound Description (Comments): area of breakdown just above rectum in gluteal fold  Present on Admission: Yes    Physical Exam: BP 135/75 (BP Location: Left Arm)   Pulse 64   Temp 97.7 F (36.5 C)   Resp 17   Ht 6\' 3"  (1.905 m)   Wt 103.8 kg   SpO2 100%   BMI 28.60 kg/m   Gen: no distress, normal appearing HEENT: oral mucosa pink and moist, NCAT Cardiovascular: No JVD.  Irregularly irregular. Respiratory: Normal effort.  No stridor.  Bilateral clear to auscultation. GI: Non-distended.  BS +.  + Colostomy. Ext: no edema Psych: pleasant, normal affect Skin:  intact Neuro: Alert and oriented Right facial weakness Expressive aphasia Dysarthria Motor: RUE: Shoulder abduction 2+/5, distally 3 -/5 RLE: 4/5 proximal to distal   Assessment/Plan: 1. Functional deficits which require 3+ hours per day of interdisciplinary therapy in a comprehensive inpatient rehab setting.  Physiatrist is providing close team supervision and 24 hour management of active medical problems listed below.  Physiatrist and rehab team continue to assess barriers to discharge/monitor patient progress toward functional and medical goals   Care Tool:  Bathing    Body parts bathed by patient: Right arm,Left arm,Chest,Abdomen,Front perineal area,Buttocks,Right upper leg,Left upper leg,Right lower leg,Left lower leg,Face         Bathing assist Assist Level: Supervision/Verbal cueing     Upper Body Dressing/Undressing Upper body dressing   What is the patient wearing?: Pull over shirt    Upper body assist Assist Level: Set up assist    Lower Body Dressing/Undressing Lower body dressing      What is the patient wearing?: Pants     Lower body assist Assist for lower body dressing: Contact Guard/Touching assist     Toileting Toileting    Toileting assist Assist for toileting: Supervision/Verbal cueing Assistive Device Comment: (P) urinal   Transfers Chair/bed transfer  Transfers assist     Chair/bed transfer assist level: Contact Guard/Touching assist     Locomotion Ambulation  Ambulation assist      Assist level: Contact Guard/Touching assist Assistive device: Walker-rolling Max distance: 200   Walk 10 feet activity   Assist     Assist level: Contact Guard/Touching assist Assistive device: Walker-rolling   Walk 50 feet activity   Assist    Assist level: Contact Guard/Touching assist Assistive device: Walker-rolling    Walk 150 feet activity   Assist    Assist level: Contact Guard/Touching assist      Walk 10 feet  on uneven surface  activity   Assist     Assist level: Contact Guard/Touching assist Assistive device: Walker-rolling   Wheelchair     Assist Will patient use wheelchair at discharge?: No   Wheelchair activity did not occur: N/A         Wheelchair 50 feet with 2 turns activity    Assist    Wheelchair 50 feet with 2 turns activity did not occur: N/A       Wheelchair 150 feet activity     Assist  Wheelchair 150 feet activity did not occur: N/A        Medical Problem List and Plan: 1. 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 Antithrombotics: -DVT/anticoagulation:Pharmaceutical:Other (comment)--now on Eliquis renal dose -antiplatelet therapy: low dose ASA 3. Pain Management:N/A 4. Mood:LCSW to follow for evaluation and support. -antipsychotic agents: N/a 5. Neuropsych: This patientmaybeiscapable of making decisions onhisown behalf. 6. Skin/Wound Care:Routine pressure relief measures 7. Fluids/Electrolytes/Nutrition:strict I/Os.  8. Chronic A fib: Continue amiodarone for NSVT/rate control (had bradycardia with BB) Monitor HR TID and follow up with Dr. Terrence Roach after d/c  Monitor with increased exertion 9. T2DM with hyperglycemia:   Hgb A1C reviewed 6/4: 8.4. Current CBGs 123-195: continue to monitor.   Discussed with pharmacy, dapagligozin on hold due to kidney function.   Hold glipizide due to CKD. --Monitor BS ac/hs and continue SSI for elevated BS.  --Carb modified/heart healthy restrictions added.  Monitor with increased mobility 10. CKDIIIb:   Creatinine 1.92 on 6/3, continue to monitor 11.CAD s/p CABG/ICM with EF 40-45%: Monitor for signs of overload.  Was on dapaglifozin(resume for cardiac/DM), Lasix and atorvastatin PTA.  Filed  Weights   06/03/20 1718 06/04/20 0443 06/05/20 0600  Weight: 104.9 kg 105 kg 103.8 kg   13. Constipation: Added 1senna S at bedtime to help soften stools.  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 Disposition: patient prefers shorter length of stay, has support of wife at home.    LOS: 2 days A FACE TO FACE EVALUATION WAS PERFORMED  Jeffery Roach 06/05/2020, 5:17 PM

## 2020-06-05 NOTE — Progress Notes (Signed)
Occupational Therapy Session Note  Patient Details  Name: Jeffery Roach MRN: [id number] Date of Birth: 1927-05-06  Today's Date: 06/05/2020 OT Individual Time: 5056-9794 OT Individual Time Calculation (min): 40 min    Short Term Goals: Week 1:  OT Short Term Goal 1 (Week 1): Pt will complete 3/3 toileting tasks with close spvsn using LRAD - toilet or BSC level. OT Short Term Goal 2 (Week 1): Pt will complete LB adls with close spvsn consistently. OT Short Term Goal 3 (Week 1): Pt will complete dynamic standing adl with no LOB using LRAD. OT Short Term Goal 4 (Week 1): Pt will use RUE as stabilizer in grooming task without cues for initiation.  Skilled Therapeutic Interventions/Progress Updates:    Treatment session with focus on functional mobility, R attention, and RUE NMR.  Pt received upright in recliner agreeable to therapy session.  Pt ambulated to Dayroom without AD with CGA and min cues to attend to obstacles and items in R vision, pt even passing hallway to gym on R.  Engaged in [place] in sitting with use of dowel with chest presses and overhead presses with focus on increased symmetry, sustained grasp, and ROM.  Utilized ring arc with focus on finger isolation and pinch to obtain ring and move it across arc and across midline.  Pt frequently dropping rings, reporting decreased finger mobility.  Pt also releasing ring prematurely, resulting in dropping ring.  Pt ambulated back to room with CGA and cues to increase arm swing as pt with tendency to keep RUE in flexed position.  Pt remained upright in recliner with seat belt alarm on and all needs in reach.  Therapy Documentation Precautions:  Precautions Precautions: Fall Precaution Comments: R hemi, sacral wound Restrictions Weight Bearing Restrictions: No General:   Vital Signs: Therapy Vitals Temp: 97.7 F (36.5 C) Pulse Rate: 64 Resp: 17 BP: 135/75 Patient Position (if appropriate): Sitting Oxygen Therapy SpO2: 100 % O2  Device: Room Air Pain:  Pt with no c/o pain   Therapy/Group: Individual Therapy  Simonne Come 06/05/2020, 1:42 PM

## 2020-06-05 NOTE — Progress Notes (Signed)
Physical Therapy Session Note  Patient Details  Name: Jeffery Roach MRN: [id number] Date of Birth: 16-Sep-1927  Today's Date: 06/05/2020 PT Individual Time: 1100-1200 PT Individual Time Calculation (min): 60 min   Short Term Goals: Week 1:  PT Short Term Goal 1 (Week 1): STG=LTG due to ELOS  Skilled Therapeutic Interventions/Progress Updates:    pt received in recliner agreeable to therapy. Pt demonstrated expressive aphasia throughout session with intermittent ability to effectively expressive self with hand gestures, yes/no answers, and extra time. Pt grossly CGA-supervision for all transfers throughout session without AD. Pt directed in gait training without AD CGA for 150' +250' +250' with VC for trunk extension, increased step height on RLE. Pt directed in 2x10 toe taps on BLE on 8" box initially however pt unable to complete with tapping on LLE as he was unable to balance on RLE, box lowered to 6" and CGA on RLE to complete and min A -mod A on LLE to tap for improved dynamic balance and coordination. Pt directed in 3x6 cone weaving CGA with poor step length noted and increased trunk flexion, slightly improved with cues. Pt directed in stepping over small objects on RLE for 20' x4 without AD for improved foot clearance with fair effect through regressed with fatigue on final 2 items. Pt directed in dynamic standing activity with checkers to improve duel task with mobility grossly supervision. Pt then directed in lateral stepping 10' x4 and backward stepping 10' x4 CGA no AD. Pt then directed in gait back to room, included above and returned to recliner, All needs in reach and in good condition. Call light in hand.  And alarm set.   Therapy Documentation Precautions:  Precautions Precautions: Fall Precaution Comments: R hemi, sacral wound Restrictions Weight Bearing Restrictions: No General:   Vital Signs:  Pain: Pain Assessment Pain Scale: 0-10 Pain Score: 0-No pain Mobility:    Locomotion :    Trunk/Postural Assessment :    Balance:   Exercises:   Other Treatments:      Therapy/Group: Individual Therapy  Junie Panning 06/05/2020, 12:08 PM

## 2020-06-06 LAB — GLUCOSE, CAPILLARY
Glucose-Capillary: 118 mg/dL — ABNORMAL HIGH (ref 70–99)
Glucose-Capillary: 180 mg/dL — ABNORMAL HIGH (ref 70–99)
Glucose-Capillary: 125 mg/dL — ABNORMAL HIGH (ref 70–99)
Glucose-Capillary: 162 mg/dL — ABNORMAL HIGH (ref 70–99)

## 2020-06-06 MED ORDER — FUROSEMIDE 20 MG PO TABS
20.0000 mg | ORAL_TABLET | Freq: Once | ORAL | Status: AC
  Administered 2020-06-06: 20 mg via ORAL
  Filled 2020-06-06: qty 1

## 2020-06-06 MED ORDER — AMLODIPINE BESYLATE 2.5 MG PO TABS: 2.5000 mg | ORAL_TABLET | Freq: Every day | ORAL | Status: DC

## 2020-06-06 NOTE — Progress Notes (Signed)
[facility] PHYSICAL MEDICINE & REHABILITATION PROGRESS NOTE  Subjective/Complaints: No complaints Very pleasant, jovial Expressive aphasia SBP elevated to 171  ROS: Denies CP, SOB, N/V/D  Objective: Vital Signs: Blood pressure (!) 171/80, pulse 63, temperature 97.7 F (36.5 C), temperature source Oral, resp. rate 17, height 6\' 3"  (1.905 m), weight 105.7 kg, SpO2 94 %. No results found. Recent Labs    06/04/20 0610  WBC 9.3  HGB 14.5  HCT 45.3  PLT 187   Recent Labs    06/04/20 0610  NA 137  K 4.4  CL 107  CO2 20*  GLUCOSE 123*  BUN 29*  CREATININE 1.92*  CALCIUM 9.6    Intake/Output Summary (Last 24 hours) at 06/06/2020 1349 Last data filed at 06/06/2020 1245 Gross per 24 hour  Intake 1316 ml  Output 2400 ml  Net -1084 ml     Pressure Injury 05/31/20 Coccyx Mid Stage 2 -  Partial thickness loss of dermis presenting as a shallow open injury with a red, pink wound bed without slough. area of breakdown just above rectum in gluteal fold (Active)  05/31/20 0240  Location: Coccyx  Location Orientation: Mid  Staging: Stage 2 -  Partial thickness loss of dermis presenting as a shallow open injury with a red, pink wound bed without slough.  Wound Description (Comments): area of breakdown just above rectum in gluteal fold  Present on Admission: Yes    Physical Exam: BP (!) 171/80 (BP Location: Left Arm)   Pulse 63   Temp 97.7 F (36.5 C) (Oral)   Resp 17   Ht 6\' 3"  (1.905 m)   Wt 105.7 kg   SpO2 94%   BMI 29.13 kg/m   Gen: no distress, normal appearing HEENT: oral mucosa pink and moist, NCAT Cardiovascular: No JVD.  Irregularly irregular. Respiratory: Normal effort.  No stridor.  Bilateral clear to auscultation. GI: Non-distended.  BS +.  + Colostomy. Cardio: Reg rate Chest: normal effort, normal rate of breathing Abd: soft, non-distended Ext: no edema Psych: pleasant, normal affect Skin: intact Neuro: Alert and oriented Right facial weakness Expressive  aphasia Dysarthria Motor: RUE: Shoulder abduction 2+/5, distally 3 -/5 RLE: 4/5 proximal to distal    Assessment/Plan: 1. Functional deficits which require 3+ hours per day of interdisciplinary therapy in a comprehensive inpatient rehab setting.  Physiatrist is providing close team supervision and 24 hour management of active medical problems listed below.  Physiatrist and rehab team continue to assess barriers to discharge/monitor patient progress toward functional and medical goals   Care Tool:  Bathing    Body parts bathed by patient: Right arm,Left arm,Chest,Abdomen,Front perineal area,Buttocks,Right upper leg,Left upper leg,Right lower leg,Left lower leg,Face         Bathing assist Assist Level: Supervision/Verbal cueing     Upper Body Dressing/Undressing Upper body dressing   What is the patient wearing?: Pull over shirt    Upper body assist Assist Level: Set up assist    Lower Body Dressing/Undressing Lower body dressing      What is the patient wearing?: Pants     Lower body assist Assist for lower body dressing: Contact Guard/Touching assist     Toileting Toileting    Toileting assist Assist for toileting: Supervision/Verbal cueing Assistive Device Comment: (P) urinal   Transfers Chair/bed transfer  Transfers assist     Chair/bed transfer assist level: Contact Guard/Touching assist     Locomotion Ambulation   Ambulation assist      Assist level: Contact Guard/Touching assist  Assistive device: Walker-rolling Max distance: 200   Walk 10 feet activity   Assist     Assist level: Contact Guard/Touching assist Assistive device: Walker-rolling   Walk 50 feet activity   Assist    Assist level: Contact Guard/Touching assist Assistive device: Walker-rolling    Walk 150 feet activity   Assist    Assist level: Contact Guard/Touching assist      Walk 10 feet on uneven surface  activity   Assist     Assist level:  Contact Guard/Touching assist Assistive device: Walker-rolling   Wheelchair     Assist Will patient use wheelchair at discharge?: No   Wheelchair activity did not occur: N/A         Wheelchair 50 feet with 2 turns activity    Assist    Wheelchair 50 feet with 2 turns activity did not occur: N/A       Wheelchair 150 feet activity     Assist  Wheelchair 150 feet activity did not occur: N/A        Medical Problem List and Plan: 1. 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 Antithrombotics: -DVT/anticoagulation:Pharmaceutical:Other (comment)--now on Eliquis renal dose -antiplatelet therapy: low dose ASA 3. Pain Management:N/A 4. Mood:LCSW to follow for evaluation and support. -antipsychotic agents: N/a 5. Neuropsych: This patientmaybeiscapable of making decisions onhisown behalf. 6. Skin/Wound Care:Routine pressure relief measures 7. Fluids/Electrolytes/Nutrition:strict I/Os.  8. Chronic A fib: Continue amiodarone for NSVT/rate control (had bradycardia with BB) Monitor HR TID and follow up with Dr. Terrence Dupont after d/c  Monitor with increased exertion 9. T2DM with hyperglycemia:   Hgb A1C reviewed 6/4: 8.4. Current CBGs 123-195: continue to monitor.   Discussed with pharmacy, dapagligozin on hold due to kidney function.   Hold glipizide due to CKD. --Monitor BS ac/hs and continue SSI for elevated BS.  --Carb modified/heart healthy restrictions added.  Monitor with increased mobility 10. CKDIIIb:   Creatinine 1.92 on 6/3, continue to monitor 11.CAD s/p CABG/ICM with EF 40-45%: Monitor for signs of overload.  Was on dapaglifozin(resume for cardiac/DM), Lasix and atorvastatin PTA.   6/5: weight up, Cr trending down, 20mg  lasix today, repeat Cr tomorrow.   Filed Weights   06/04/20 0443 06/05/20 0600 06/06/20 0500  Weight: 105 kg 103.8 kg 105.7 kg   13. Constipation: Added 1senna S at bedtime to help soften stools.  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 HTN: Lasix as #11 15. Disposition: patient prefers shorter length of stay, has support of wife at home.    LOS: 3 days A FACE TO FACE EVALUATION WAS PERFORMED  Jeffery Roach 06/06/2020, 1:49 PM

## 2020-06-07 DIAGNOSIS — N1832 Chronic kidney disease, stage 3b: Secondary | ICD-10-CM

## 2020-06-07 DIAGNOSIS — E669 Obesity, unspecified: Secondary | ICD-10-CM

## 2020-06-07 DIAGNOSIS — E1169 Type 2 diabetes mellitus with other specified complication: Secondary | ICD-10-CM

## 2020-06-07 LAB — GLUCOSE, CAPILLARY
Glucose-Capillary: 125 mg/dL — ABNORMAL HIGH (ref 70–99)
Glucose-Capillary: 158 mg/dL — ABNORMAL HIGH (ref 70–99)
Glucose-Capillary: 114 mg/dL — ABNORMAL HIGH (ref 70–99)
Glucose-Capillary: 170 mg/dL — ABNORMAL HIGH (ref 70–99)

## 2020-06-07 LAB — BASIC METABOLIC PANEL
Anion gap: 8 (ref 5–15)
BUN: 29 mg/dL — ABNORMAL HIGH (ref 8–23)
CO2: 24 mmol/L (ref 22–32)
Calcium: 9.3 mg/dL (ref 8.9–10.3)
Chloride: 106 mmol/L (ref 98–111)
Creatinine, Ser: 1.92 mg/dL — ABNORMAL HIGH (ref 0.61–1.24)
GFR, Estimated: 33 mL/min — ABNORMAL LOW (ref >60)
Glucose, Bld: 187 mg/dL — ABNORMAL HIGH (ref 70–99)
Potassium: 3.7 mmol/L (ref 3.5–5.1)
Sodium: 138 mmol/L (ref 135–145)

## 2020-06-07 LAB — CBC
HCT: 45.2 % (ref 39.0–52.0)
Hemoglobin: 14.2 g/dL (ref 13.0–17.0)
MCH: 27.9 pg (ref 26.0–34.0)
MCHC: 31.4 g/dL (ref 30.0–36.0)
MCV: 88.8 fL (ref 80.0–100.0)
Platelets: 222 10*3/uL (ref 150–400)
RBC: 5.09 MIL/uL (ref 4.22–5.81)
RDW: 14 % (ref 11.5–15.5)
WBC: 9.1 10*3/uL (ref 4.0–10.5)
nRBC: 0 % (ref 0.0–0.2)

## 2020-06-07 NOTE — Progress Notes (Signed)
Physical Therapy Session Note  Patient Details  Name: Jeffery Roach MRN: [id number] Date of Birth: 1927/06/10  Today's Date: 06/07/2020 PT Individual Time: 1430-1500 PT Individual Time Calculation (min): 30 min   Short Term Goals: Week 1:  PT Short Term Goal 1 (Week 1): STG=LTG due to ELOS Week 2:     Skilled Therapeutic Interventions/Progress Updates:    Pain:  Pt reports no pain.  Treatment to tolerance.  Rest breaks and repositioning as needed.  Pt initially oob in recliner and agreeable to treatment session w/focus on ADLs, gait. Pt gesturing to dresser.  Sit to stand w/cga, gait to dresser x 14ft w/cga.  Pt able to bend to open bottom drawer and retrieve pants from bottom drawer.  stand pivot transfer to edge of bed w/cga. Pt removes pants and dons clean pants to thighs in sitting w/supervision and additional time.  Poor use of RUE w/task, completes  w/min assist to complete R side in standing.   Pt then gestures to urinal and attempts to use in sitting but unable to void. cga gait  to sink where pt washes hands w/verbal cues, supervision. Pt then ambultates to commode w/cga and stands to void w/close supervision.  Gait 179ft x 2w/cga mild R knee instability/wobbles approx 10% steps, increased lateral trunk excursions at stance bilaterally, decreased step length bilat R slightly more so than L.  Standing balance: Standing marching in place x 45 sec w/cga.  Pt then ambulates to dresser, leans over to open middle drawer, pulls out 2 neoprene knee sleeves to show therapist.  Returns to recliner w/cga.   Pt left oob in recliner w/chair alarm set and needs in reach.  Therapy Documentation Precautions:  Precautions Precautions: Fall Precaution Comments: R hemi, sacral wound Restrictions Weight Bearing Restrictions: No    Therapy/Group: Individual Therapy  Callie Fielding, [place] 06/07/2020, 3:36 PM

## 2020-06-07 NOTE — Progress Notes (Signed)
Patient ID: Rhea Thrun, male   DOB: [birth year], 86 y.o.   MRN: [id number] Follow up with the patient regarding secondary stroke risks and education on HLD, DM and HTN management. Reviewed HH, low carb diet and CKD/HF recommended dietary modifications. Patient noted that he ate salads more than vegetables PTA and acknowledged understanding of tips to help make salads healthier; modifications for dressings and toppings. Also reviewed medications although patient noted his wife managed his meds. Briefly reviewed Zone tool for HF and daily weight checks. Difficult to understand due to aphasia, however, patient appears to understand information reviewed and handouts in room for wife. Continue to follow along to discharge to address educational needs and collaborate with the SW to facilitate preparation for discharge. Margarito Liner

## 2020-06-07 NOTE — Progress Notes (Signed)
PHYSICAL MEDICINE & REHABILITATION PROGRESS NOTE  Subjective/Complaints: No new issues. Slept well. Comfortable  ROS: limited due to language/communication    Objective: Vital Signs: Blood pressure (!) 167/84, pulse (!) 59, temperature 97.6 F (36.4 C), temperature source Oral, resp. rate 18, height 6\' 3"  (1.905 m), weight 103.3 kg, SpO2 94 %. No results found. Recent Labs    06/07/20 0736  WBC 9.1  HGB 14.2  HCT 45.2  PLT 222   Recent Labs    06/07/20 0736  NA 138  K 3.7  CL 106  CO2 24  GLUCOSE 187*  BUN 29*  CREATININE 1.92*  CALCIUM 9.3    Intake/Output Summary (Last 24 hours) at 06/07/2020 1010 Last data filed at 06/07/2020 0606 Gross per 24 hour  Intake 600 ml  Output 2375 ml  Net -1775 ml     Pressure Injury 05/31/20 Coccyx Mid Stage 2 -  Partial thickness loss of dermis presenting as a shallow open injury with a red, pink wound bed without slough. area of breakdown just above rectum in gluteal fold (Active)  05/31/20 0240  Location: Coccyx  Location Orientation: Mid  Staging: Stage 2 -  Partial thickness loss of dermis presenting as a shallow open injury with a red, pink wound bed without slough.  Wound Description (Comments): area of breakdown just above rectum in gluteal fold  Present on Admission: Yes    Physical Exam: BP (!) 167/84 (BP Location: Right Arm)   Pulse (!) 59   Temp 97.6 F (36.4 C) (Oral)   Resp 18   Ht 6\' 3"  (1.905 m)   Wt 103.3 kg   SpO2 94%   BMI 28.46 kg/m   Constitutional: No distress . Vital signs reviewed. HEENT: EOMI, oral membranes moist Neck: supple Cardiovascular: IRR without murmur. No JVD    Respiratory/Chest: CTA Bilaterally without wheezes or rales. Normal effort    GI/Abdomen: BS +, non-tender, non-distended, colostomy Ext: no clubbing, cyanosis, or edema Psych: pleasant and cooperative Skin: intact Neuro: Alert and oriented Right facial weakness Expressive aphasia but able to express  needs Dysarthria Motor: RUE: Shoulder abduction 2+/5, distally 3-/5 RLE: 4/5 prox to distal.    Assessment/Plan: 1. Functional deficits which require 3+ hours per day of interdisciplinary therapy in a comprehensive inpatient rehab setting.  Physiatrist is providing close team supervision and 24 hour management of active medical problems listed below.  Physiatrist and rehab team continue to assess barriers to discharge/monitor patient progress toward functional and medical goals   Care Tool:  Bathing    Body parts bathed by patient: Right arm,Left arm,Chest,Abdomen,Front perineal area,Buttocks,Right upper leg,Left upper leg,Right lower leg,Left lower leg,Face         Bathing assist Assist Level: Supervision/Verbal cueing     Upper Body Dressing/Undressing Upper body dressing   What is the patient wearing?: Pull over shirt    Upper body assist Assist Level: Set up assist    Lower Body Dressing/Undressing Lower body dressing      What is the patient wearing?: Pants     Lower body assist Assist for lower body dressing: Contact Guard/Touching assist     Toileting Toileting    Toileting assist Assist for toileting: Supervision/Verbal cueing Assistive Device Comment: (P) urinal   Transfers Chair/bed transfer  Transfers assist     Chair/bed transfer assist level: Contact Guard/Touching assist     Locomotion Ambulation   Ambulation assist      Assist level: Contact Guard/Touching assist Assistive device: Walker-rolling  Max distance: 200   Walk 10 feet activity   Assist     Assist level: Contact Guard/Touching assist Assistive device: Walker-rolling   Walk 50 feet activity   Assist    Assist level: Contact Guard/Touching assist Assistive device: Walker-rolling    Walk 150 feet activity   Assist    Assist level: Contact Guard/Touching assist      Walk 10 feet on uneven surface  activity   Assist     Assist level: Contact  Guard/Touching assist Assistive device: Walker-rolling   Wheelchair     Assist Will patient use wheelchair at discharge?: No   Wheelchair activity did not occur: N/A         Wheelchair 50 feet with 2 turns activity    Assist    Wheelchair 50 feet with 2 turns activity did not occur: N/A       Wheelchair 150 feet activity     Assist  Wheelchair 150 feet activity did not occur: N/A        Medical Problem List and Plan: 1. 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 Antithrombotics: -DVT/anticoagulation:Pharmaceutical:Other (comment)--now on Eliquis renal dose -antiplatelet therapy: low dose ASA 3. Pain Management:N/A 4. Mood:LCSW to follow for evaluation and support. -antipsychotic agents: N/a 5. Neuropsych: This patientmaybeiscapable of making decisions onhisown behalf. 6. Skin/Wound Care:Routine pressure relief measures 7. Fluids/Electrolytes/Nutrition:strict I/Os.  8. Chronic A fib: Continue amiodarone for NSVT/rate control (had bradycardia with BB) Monitor HR TID and follow up with Dr. Terrence Dupont after d/c  Monitor with increased exertion 9. T2DM with hyperglycemia:   -sugars under reasonable control  Hgb A1C reviewed 6/4: 8.4. Curre    dapagligozin can be taken with CKD--continue.   Resume glipizide if needed. --Monitor BS ac/hs and continue SSI for elevated BS.  --Carb modified/heart healthy restrictions added.    10. CKDIIIb:   Creatinine 1.92 on 6/6, continue to monitor 11.CAD s/p CABG/ICM with EF 40-45%: Monitor for signs of overload.  Was on dapaglifozin(resume for cardiac/DM), Lasix and atorvastatin PTA.   6/5:   20mg  lasix today   6/6 weights back to 103kg---continue same meds Filed Weights    06/05/20 0600 06/06/20 0500 06/07/20 0456  Weight: 103.8 kg 105.7 kg 103.3 kg   13. Constipation: Added 1senna S at bedtime to help soften stools.  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 HTN: Lasix as #11  .    LOS: 4 days A FACE TO FACE EVALUATION WAS PERFORMED  Meredith Staggers 06/07/2020, 10:10 AM

## 2020-06-07 NOTE — Progress Notes (Signed)
Occupational Therapy Session Note  Patient Details  Name: Jeffery Roach MRN: [id number] Date of Birth: [birth year]  Today's Date: 06/07/2020 OT Individual Time: 1000-1100 OT Individual Time Calculation (min): 60 min    Short Term Goals: Week 1:  OT Short Term Goal 1 (Week 1): Pt will complete 3/3 toileting tasks with close spvsn using LRAD - toilet or BSC level. OT Short Term Goal 2 (Week 1): Pt will complete LB adls with close spvsn consistently. OT Short Term Goal 3 (Week 1): Pt will complete dynamic standing adl with no LOB using LRAD. OT Short Term Goal 4 (Week 1): Pt will use RUE as stabilizer in grooming task without cues for initiation.  Skilled Therapeutic Interventions/Progress Updates:    Patient in bed, alert and ready for therapy session.  He denies pain and is pleasant t/o session.   dysarthric speech difficult to understand at times, but he is able to make basic wants and needs known.  He follows directions and does not demonstrate any impulsive behaviors.  Supine to sit with CS.   Sit to stand and ambulation in room to/from bed, shower bench and recliner with CS/CGA.  He completed shower with CS/set up, dressing tasks seated edge of bed with CS/set up to include compression socks, underwear, pants and OH shirt.  Oral care in stance with CS.   He ambulated on unit without AD CG/CS to/from therapy gym.  He declined trying shoe funnel.  Completed right UE inhibition of flexors, facilitation of extensors with weight bearing and stretching activities.  He returned to recliner at close of session, seat belt alarm set and call bell in hand.    Therapy Documentation Precautions:  Precautions Precautions: Fall Precaution Comments: R hemi, sacral wound Restrictions Weight Bearing Restrictions: No   Therapy/Group: Individual Therapy  Carlos Levering 06/07/2020, 7:26 AM

## 2020-06-07 NOTE — Progress Notes (Signed)
Physical Therapy Session Note  Patient Details  Name: Jeffery Roach MRN: [id number] Date of Birth: [birth year]  Today's Date: 06/07/2020 PT Individual Time: 1500-1600 PT Individual Time Calculation (min): 60 min   Short Term Goals: Week 1:  PT Short Term Goal 1 (Week 1): STG=LTG due to ELOS  Skilled Therapeutic Interventions/Progress Updates:    Patient in recliner in room and without complaints.  Patient sit to stand with CGA and refusing to use RW.  Ambulated without device with CGA to dayroom 80'.  Patient negotiated obstacle course stepping around and over obstacles with CGA.  Performed sit<>stand x 5 reps x 2 sets for LE strength.  Patient ambulated kicking bean bags x 200' with CGA for working on SLS and stride length.  Patient standing for step taps to cones with HHA difficulty with L foot so switched to 4" step and performed x 10 reps.  Patient assisted in w/c outside.  Ambulated over inclined pavement and brick with CGA x 200'.  Negotiated curb step with CGA to close S x 4 reps.  Negotiated 8 steps with L rail x 2 reps with CGA.  Patient assisted back to unit in w/c and performed 5 minutes on Nu Step at level 3 with L UE and bilateral LE's.  Patient ambulated back to room carrying cup of ice water with CGA x 80'.  Left seated in recliner with alarm belt active, call bell and needs in reach.   Therapy Documentation Precautions:  Precautions Precautions: Fall Precaution Comments: R hemi, sacral wound Restrictions Weight Bearing Restrictions: No Pain: Pain Assessment Pain Score: 0-No pain   Therapy/Group: Individual Therapy  Reginia Naas  [place], PT 06/07/2020, 3:52 PM

## 2020-06-07 NOTE — Progress Notes (Signed)
Speech Language Pathology Daily Session Note  Patient Details  Name: Jeffery Roach MRN: [id number] Date of Birth: 09-13-1927  Today's Date: 06/07/2020 SLP Individual Time: 0830-0930 SLP Individual Time Calculation (min): 60 min  Short Term Goals: Week 1: SLP Short Term Goal 1 (Week 1): STG = LTG due to ELOS  Skilled Therapeutic Interventions: Pt seen for skilled ST with focus on speech and cognitive goals. During session, with known topic, pt intelligibility ~60%, unknown topic intelligility ~25%. Pt encouraged to reduce sentence length to 2-3 words, requiring max cues to utilize throughout session. Pt naming simple picture cards and reading simple words to continue to assess and treat dysarthria. Pt demonstrates inconsistent substitutions and distortions of phonemes ("toe" for "go, "lie" for "tie") with most difficulty noted with velar and alveolar sounds. Pt benefits greatly from seeing and mimicking correct placement of articulators, recommend future SLPs wear clear mask + mirror for pt. Pt responsive to multimodal cueing throughout. SLP facilitating simple orientation task by providing min A verbal cues. Pt left in bed with alarm set and all needs within reach. Cont ST POC.   Pain Pain Assessment Pain Scale: 0-10 Pain Score: 0-No pain  Therapy/Group: Individual Therapy  Dewaine Conger 06/07/2020, 9:11 AM

## 2020-06-08 LAB — GLUCOSE, CAPILLARY
Glucose-Capillary: 129 mg/dL — ABNORMAL HIGH (ref 70–99)
Glucose-Capillary: 151 mg/dL — ABNORMAL HIGH (ref 70–99)
Glucose-Capillary: 135 mg/dL — ABNORMAL HIGH (ref 70–99)
Glucose-Capillary: 139 mg/dL — ABNORMAL HIGH (ref 70–99)
Glucose-Capillary: 213 mg/dL — ABNORMAL HIGH (ref 70–99)

## 2020-06-08 NOTE — Progress Notes (Signed)
Patient ID: Jeffery Roach, male   DOB: 1927-02-19, 87 y.o.   MRN: [id number]  Met with pt and spoke with wife via telephone to inform of team conference goals of supervision level and target discharge date 6/9. Team wants her to come in tomorrow for education she needs to get a ride and can be here at 3:00 will schedule PT session. Will discuss OP versus home health therapies then. No equipment needs. See when here tomorrow.

## 2020-06-08 NOTE — Patient Care Conference (Signed)
Inpatient RehabilitationTeam Conference and Plan of Care Update Date: 06/08/2020   Time: 12:01 PM    Patient Name: Jeffery Roach      Medical Record Number: [id number]  Date of Birth: [birth year] Sex: Male         Room/Bed: 4W06C/4W06C-01 Payor Info: Payor: MEDICARE / Plan: MEDICARE PART A AND B / Product Type: *No Product type* /    Admit Date/Time:  06/03/2020  4:56 PM  Primary Diagnosis:  Cerebellar stroke [facility])  Hospital Problems: Principal Problem:   Cerebellar stroke ([place]) Active Problems:   Right sided weakness   Dysarthria   Chronic combined systolic and diastolic congestive heart failure (HCC)   Chronic atrial fibrillation (HCC)   Right hemiparesis ([place])   Dysphagia, post-stroke    Expected Discharge Date: Expected Discharge Date: 06/10/20  Team Members Present: Physician leading conference: Dr. Alger Simons Care Coodinator Present: Ovidio Kin, LCSW;Ella Guillotte Hervey Ard, RN, BSN, [place] Nurse Present: Dorien Chihuahua, RN PT Present: Magda Kiel, PT OT Present: Elisabeth Most, OT SLP Present: Weston Anna, SLP PPS Coordinator present : Ileana Ladd, PT     Current Status/Progress Goal Weekly Team Focus  Bowel/Bladder   Patient is continent of bladder, and colostomy care  Maintain contience and colostomy care  Address and assess toileting needs,colostomy care with education   Swallow/Nutrition/ Hydration   Dys. 3 textures with thin liquids, Intermittent supervision  Mod I  use of swallow strategies   ADL's   adl set up/CS when seated, CGA in stance, funcitonal ambulation and transfers CGA/CS  CS/set up  right NMRE, family education, standing balance   Mobility   S transfers, CGA at times ambulation no device up to 250', refuses RW, steps CGA with rail  S overall  balance, activity tolerance, safety/deficit awareness, fall prevention   Communication   Max A  Supervision  use of speech intelligibility strategies, error awareness   Safety/Cognition/ Behavioral  Observations  Min A  Mod I  orientation, baic memory   Pain   Denies pain  pain < = 2/10 on pain scale  Assess QS/PRN address concerns   Skin   Colostomy site unremarkable;, Foam dressing to mid- coccyx with Stage 2, redness,pink,,  Restore skin integrity, free of skin breakdown  Assess QS/PRN,     Discharge Planning:  Home with wife who is elderly and of small statue, needs to be supervision-mod/i before going home.   Team Discussion: New infarct; now with bil cerebellar infarct 2/2 a-fib. Doing well medically. MD monitoring HTN and DM. Stage 2 coccyx healing.  Patient on target to meet rehab goals: yes, currently supervision for transfers, CGA for ambulation with supervision goals set for discharge.  *See Care Plan and progress notes for long and short-term goals.   Revisions to Treatment Plan:  Given premorbid cognitive issues; baseline for cognition and speech, no follow up SLP recommended Worked on steps , balance issues and worked on uneven surfaces and a compliment of surfaces.  Teaching Needs: Transfers, toileting, medications, secondary stroke risk management, etc.   Current Barriers to Discharge: Decreased caregiver support and Home enviroment access/layout  Possible Resolutions to Barriers:  Family education    Medical Summary Current Status: embolic cerebellar, posterior circulation infarct d/t embolus, a-fib. chronic right hemiparesis, aphasia. worsening dysarthria. bp controlled, sugars reasonable  Barriers to Discharge: Medical stability   Possible Resolutions to [employer] Focus: daily assessment of pt data, vs. optimize bp/cbg control   Continued Need for Acute Rehabilitation Level of Care: The patient requires daily  medical management by a physician with specialized training in physical medicine and rehabilitation for the following reasons: Direction of a multidisciplinary physical rehabilitation program to maximize functional independence :  Yes Medical management of patient stability for increased activity during participation in an intensive rehabilitation regime.: Yes Analysis of laboratory values and/or radiology reports with any subsequent need for medication adjustment and/or medical intervention. : Yes   I attest that I was present, lead the team conference, and concur with the assessment and plan of the team.   Dorien Chihuahua B 06/08/2020, 3:27 PM

## 2020-06-08 NOTE — Plan of Care (Signed)
  Problem: RH Balance Goal: LTG Patient will maintain dynamic standing with ADLs (OT) Description: LTG:  Patient will maintain dynamic standing balance with assist during activities of daily living (OT)  Flowsheets (Taken 06/08/2020 0733) LTG: Pt will maintain dynamic standing balance during ADLs with: Supervision/Verbal cueing   Problem: Sit to Stand Goal: LTG:  Patient will perform sit to stand in prep for activites of daily living with assistance level (OT) Description: LTG:  Patient will perform sit to stand in prep for activites of daily living with assistance level (OT) Flowsheets (Taken 06/08/2020 0733) LTG: PT will perform sit to stand in prep for activites of daily living with assistance level: Independent with assistive device   Problem: RH Grooming Goal: LTG Patient will perform grooming w/assist,cues/equip (OT) Description: LTG: Patient will perform grooming with assist, with/without cues using equipment (OT) Flowsheets (Taken 06/08/2020 0733) LTG: Pt will perform grooming with assistance level of: Independent with assistive device    Problem: RH Bathing Goal: LTG Patient will bathe all body parts with assist levels (OT) Description: LTG: Patient will bathe all body parts with assist levels (OT) Flowsheets (Taken 06/08/2020 0733) LTG: Pt will perform bathing with assistance level/cueing: Supervision/Verbal cueing   Problem: RH Dressing Goal: LTG Patient will perform upper body dressing (OT) Description: LTG Patient will perform upper body dressing with assist, with/without cues (OT). Flowsheets (Taken 06/08/2020 0733) LTG: Pt will perform upper body dressing with assistance level of: Independent with assistive device Goal: LTG Patient will perform lower body dressing w/assist (OT) Description: LTG: Patient will perform lower body dressing with assist, with/without cues in positioning using equipment (OT) Flowsheets (Taken 06/08/2020 0733) LTG: Pt will perform lower body dressing with  assistance level of: Independent with assistive device   Problem: RH Toileting Goal: LTG Patient will perform toileting task (3/3 steps) with assistance level (OT) Description: LTG: Patient will perform toileting task (3/3 steps) with assistance level (OT)  Flowsheets (Taken 06/08/2020 0733) LTG: Pt will perform toileting task (3/3 steps) with assistance level: Supervision/Verbal cueing   Problem: RH Functional Use of Upper Extremity Goal: LTG Patient will use RT/LT upper extremity as a (OT) Description: LTG: Patient will use right/left upper extremity as a stabilizer/gross assist/diminished/nondominant/dominant level with assist, with/without cues during functional activity (OT) Flowsheets (Taken 06/08/2020 0733) LTG: Use of upper extremity in functional activities: RUE as a stabilizer LTG: Pt will use upper extremity in functional activity with assistance level of: Supervision/Verbal cueing   Problem: RH Toilet Transfers Goal: LTG Patient will perform toilet transfers w/assist (OT) Description: LTG: Patient will perform toilet transfers with assist, with/without cues using equipment (OT) Flowsheets (Taken 06/08/2020 0733) LTG: Pt will perform toilet transfers with assistance level of: Supervision/Verbal cueing   Problem: RH Tub/Shower Transfers Goal: LTG Patient will perform tub/shower transfers w/assist (OT) Description: LTG: Patient will perform tub/shower transfers with assist, with/without cues using equipment (OT) Flowsheets (Taken 06/08/2020 0733) LTG: Pt will perform tub/shower stall transfers with assistance level of: Supervision/Verbal cueing

## 2020-06-08 NOTE — Progress Notes (Signed)
[facility] PHYSICAL MEDICINE & REHABILITATION PROGRESS NOTE  Subjective/Complaints: No new complaints. Seems comfortable  Ros limited d/t speech, language   Objective: Vital Signs: Blood pressure (!) 144/75, pulse 85, temperature 97.7 F (36.5 C), temperature source Oral, resp. rate 18, height 6\' 3"  (1.905 m), weight 103.5 kg, SpO2 98 %. No results found. Recent Labs    06/07/20 0736  WBC 9.1  HGB 14.2  HCT 45.2  PLT 222   Recent Labs    06/07/20 0736  NA 138  K 3.7  CL 106  CO2 24  GLUCOSE 187*  BUN 29*  CREATININE 1.92*  CALCIUM 9.3    Intake/Output Summary (Last 24 hours) at 06/08/2020 1306 Last data filed at 06/08/2020 0425 Gross per 24 hour  Intake 285 ml  Output 1050 ml  Net -765 ml     Pressure Injury 05/31/20 Coccyx Mid Stage 2 -  Partial thickness loss of dermis presenting as a shallow open injury with a red, pink wound bed without slough. area of breakdown just above rectum in gluteal fold (Active)  05/31/20 0240  Location: Coccyx  Location Orientation: Mid  Staging: Stage 2 -  Partial thickness loss of dermis presenting as a shallow open injury with a red, pink wound bed without slough.  Wound Description (Comments): area of breakdown just above rectum in gluteal fold  Present on Admission: Yes    Physical Exam: BP (!) 144/75 (BP Location: Right Arm)   Pulse 85   Temp 97.7 F (36.5 C) (Oral)   Resp 18   Ht 6\' 3"  (1.905 m)   Wt 103.5 kg   SpO2 98%   BMI 28.52 kg/m   Constitutional: No distress . Vital signs reviewed. HEENT: EOMI, oral membranes moist Neck: supple Cardiovascular: RRR without murmur. No JVD    Respiratory/Chest: CTA Bilaterally without wheezes or rales. Normal effort    GI/Abdomen: BS +, non-tender, non-distended Ext: no clubbing, cyanosis, or edema Psych: pleasant and cooperative Skin: intact Neuro: Alert and oriented Right facial weakness Expressive aphasia with severe dysarthria Motor: RUE: Shoulder abduction 2+/5,  distally 3-/5--motor planning issues RLE: 4/5 prox to distal.    Assessment/Plan: 1. Functional deficits which require 3+ hours per day of interdisciplinary therapy in a comprehensive inpatient rehab setting.  Physiatrist is providing close team supervision and 24 hour management of active medical problems listed below.  Physiatrist and rehab team continue to assess barriers to discharge/monitor patient progress toward functional and medical goals   Care Tool:  Bathing    Body parts bathed by patient: Right arm,Left arm,Chest,Abdomen,Front perineal area,Buttocks,Right upper leg,Left upper leg,Right lower leg,Left lower leg,Face         Bathing assist Assist Level: Supervision/Verbal cueing     Upper Body Dressing/Undressing Upper body dressing   What is the patient wearing?: Pull over shirt    Upper body assist Assist Level: Set up assist    Lower Body Dressing/Undressing Lower body dressing      What is the patient wearing?: Underwear/pull up,Pants     Lower body assist Assist for lower body dressing: Supervision/Verbal cueing     Toileting Toileting    Toileting assist Assist for toileting: Supervision/Verbal cueing Assistive Device Comment: (P) urinal   Transfers Chair/bed transfer  Transfers assist     Chair/bed transfer assist level: Contact Guard/Touching assist     Locomotion Ambulation   Ambulation assist      Assist level: Contact Guard/Touching assist Assistive device: No Device Max distance: 200  Walk 10 feet activity   Assist     Assist level: Contact Guard/Touching assist Assistive device: No Device   Walk 50 feet activity   Assist    Assist level: Contact Guard/Touching assist Assistive device: No Device    Walk 150 feet activity   Assist    Assist level: Contact Guard/Touching assist Assistive device: No Device    Walk 10 feet on uneven surface  activity   Assist     Assist level: Contact  Guard/Touching assist Assistive device: Other (comment) (no device)   Wheelchair     Assist Will patient use wheelchair at discharge?: No   Wheelchair activity did not occur: N/A         Wheelchair 50 feet with 2 turns activity    Assist    Wheelchair 50 feet with 2 turns activity did not occur: N/A       Wheelchair 150 feet activity     Assist  Wheelchair 150 feet activity did not occur: N/A        Medical Problem List and Plan: 1. Right hemiparesis, dysarthria and aphasiasecondary to small acute infarct in bilateral cerebellum, right occipital cortex and possibly right pons  -Continue CIR therapies including PT, OT, and SLP   -ELOS 6/9 2. Antithrombotics: -DVT/anticoagulation:Pharmaceutical:Other (comment)--now on Eliquis renal dose -antiplatelet therapy: low dose ASA 3. Pain Management:N/A 4. Mood:LCSW to follow for evaluation and support. -antipsychotic agents: N/a 5. Neuropsych: This patientmaybeiscapable of making decisions onhisown behalf. 6. Skin/Wound Care:Routine pressure relief measures 7. Fluids/Electrolytes/Nutrition:strict I/Os.  8. Chronic A fib: Continue amiodarone for NSVT/rate control (had bradycardia with BB) Monitor HR TID and follow up with Dr. Terrence Dupont after d/c  controlled 9. T2DM with hyperglycemia:   -sugars under reasonable control  Hgb A1C reviewed 6/4: 8.4. Curre    dapagligozin can be taken with CKD--continue.   Resume glipizide if needed. --Monitor BS ac/hs and continue SSI for elevated BS.  --Carb modified/heart healthy restrictions added.   -borderline controlled 6/7---continue to hold glipizide CBG (last 3)  Recent Labs    06/07/20 2123 06/08/20 0629 06/08/20 1202  GLUCAP 170* 129* 151*    10. CKDIIIb:   Creatinine 1.92 on 6/6, continue to monitor 11.CAD s/p CABG/ICM with EF 40-45%: Monitor for signs of overload.  Was on  dapaglifozin(resume for cardiac/DM), Lasix and atorvastatin PTA.   6/5:   20mg  lasix today   6/7 weights back to 103kg---continue same meds Filed Weights   06/06/20 0500 06/07/20 0456 06/08/20 0409  Weight: 105.7 kg 103.3 kg 103.5 kg   13. Constipation: Added 1senna S at bedtime to help soften stools.  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 HTN: Lasix as #11  .    LOS: 5 days A FACE TO FACE EVALUATION WAS PERFORMED  Meredith Staggers 06/08/2020, 1:06 PM

## 2020-06-08 NOTE — Progress Notes (Signed)
Physical Therapy Session Note  Patient Details  Name: Jeffery Roach MRN: [id number] Date of Birth: 09/28/1927  Today's Date: 06/08/2020 PT Individual Time:Session1: 3235-5732; Arthor Captain: 1500-1600 PT Individual Time Calculation (min): 42 min & 60 min  Short Term Goals: Week 1:  PT Short Term Goal 1 (Week 1): STG=LTG due to ELOS  Skilled Therapeutic Interventions/Progress Updates:    Session1:  Patient in supine and reports no issues.  Performed supine to sit with S HOB elevated.  Seated to don pants with S and increased time, then shoes with S and effort and time.  Sit to stand with CGA to don pants fully with close S.  Patient ambulated to sink to wash face, brush teeth and comb hair with S.  Patient ambulated to therapy gym x 180' with S to CGA cues for direction.  Patient on compliant surface stepping forward and back cues for stride length and CGA to min A for balance.  Side stepping on mat with HHA with min A and cues for feet forward.  Patient ambulated 140' while giving and getting ball with L hand and cues for head rotation.  Ambulated to room with CGA while continuing to rotate head to give/get ball with mod cues.  Left seated in recliner with alarm belt active and all needs in reach.  [place]: Patient seated in recliner and reports just finished previous session.  Transfers throughout session with S and ambulated x 180' with S noting decreased stride length, wide BOS, decreased trunk rotation/arm swing and decreased foot clearance.  Patient in parallel bars performed kicks forward and back with UE support and cues for trunk stability.  Requesting to toilet so ambulated to bathroom and standing to toilet with S.  Returned to gym and in parallel bars performed forward lunges onto BOSU with mod cues and facilitation for keeping trunk upright.  Patient performed side taps to BOSU with cues for keeping weight on stance leg on R for balance and LE strength and coordination.  Patient standing for  hamstring curls with mod cues and facilitation for keeping hip extended.  Patient standing with L foot on Aerex for R weight shift while playing checkers with CGA for maintaining R weight shift.  Patient ambulated 2 x 140' with head turns for identifying number cards with CGA to close S.  Patient ambulated to room and left up in recliner with alarm belt active and all needs in reach.  Therapy Documentation Precautions:  Precautions Precautions: Fall Precaution Comments: R hemi, sacral wound Restrictions Weight Bearing Restrictions: No    Therapy/Group: Individual Therapy  Reginia Naas  [place], PT 06/08/2020, 8:16 AM

## 2020-06-08 NOTE — Progress Notes (Signed)
Occupational Therapy Session Note  Patient Details  Name: Randee Upchurch MRN: [id number] Date of Birth: 10-Jul-1927  Today's Date: 06/08/2020 OT Individual Time: 1328-1430 OT Individual Time Calculation (min): 62 min    Short Term Goals: Week 1:  OT Short Term Goal 1 (Week 1): Pt will complete 3/3 toileting tasks with close spvsn using LRAD - toilet or BSC level. OT Short Term Goal 2 (Week 1): Pt will complete LB adls with close spvsn consistently. OT Short Term Goal 3 (Week 1): Pt will complete dynamic standing adl with no LOB using LRAD. OT Short Term Goal 4 (Week 1): Pt will use RUE as stabilizer in grooming task without cues for initiation.  Skilled Therapeutic Interventions/Progress Updates:    Pt received seated in recliner, agreeable to OT, and reporting no pain. Session focused on dynamic balance and incorporating RUE in functional tasks as both stabilizer and non-dominant assist. Sit<>stands close spvsn. Pt ambulated room<>ortho gym without AD at close spvsn level and safely navigated obstacles in hallway without vc's. Pt encouraged to walk with reciprocal arm swing and RUE elbow extended to prevent flexion contractures. Pt engaged in visual scanning, cognitive, and dynamic standing balance activity using BIMS with BUEs unsupported. Pt demo'd cognitive limitations while sequencing alphabet and could not successfully sequence past 'C'. Pt demo'd improved sequencing in numbers 1-20, still requiring min vc's for recall. Pt encouraged to use RUE to select numbers requiring intermittent support at R elbow from LUE, increased time, and reaching outside BOS/across midline; min vc's required to remind pt to use RUE. Pt engaged in checkers activity on vertical surface while seated working on incorporation of RUE to push pieces up while supporting with LUE; demo'd good AAROM in RUE. Pt education on importance of using RUE in functional tasks at home as much as possible to improve use/NMR and to promote  independence in BADLs. Pt remained seated in recliner, all needs met, alarm set, and call bell in reach.   Therapy Documentation Precautions:  Precautions Precautions: Fall Precaution Comments: R hemi, sacral wound Restrictions Weight Bearing Restrictions: No   Therapy/Group: Individual Therapy  Carlos Levering 06/08/2020, 7:20 AM

## 2020-06-09 LAB — GLUCOSE, CAPILLARY
Glucose-Capillary: 114 mg/dL — ABNORMAL HIGH (ref 70–99)
Glucose-Capillary: 158 mg/dL — ABNORMAL HIGH (ref 70–99)
Glucose-Capillary: 154 mg/dL — ABNORMAL HIGH (ref 70–99)
Glucose-Capillary: 135 mg/dL — ABNORMAL HIGH (ref 70–99)

## 2020-06-09 NOTE — Progress Notes (Signed)
Speech Language Pathology Daily Session Note  Patient Details  Name: Wilson Dusenbery MRN: [id number] Date of Birth: 10-Feb-1927  Today's Date: 06/09/2020 SLP Individual Time: 1130-1200 SLP Individual Time Calculation (min): 30 min  Short Term Goals: Week 1: SLP Short Term Goal 1 (Week 1): STG = LTG due to ELOS  Skilled Therapeutic Interventions: Skilled SLP intervention focused on speech intelligibility and apraxia. Pt named objects with Mod visual model with 70% intellogibility. Common errors were bilabials for velars however increased accuracy with visual cue to slps mouth for placement. He demonstrated awareness of errors and with repeated attempts he was able to increase accuracy with placement for speech sounds. Pt oriented to date and day of the week using calendar with min A verbal cues and visual aid. Cont with therapy per plan of care.      Pain Pain Assessment Pain Scale: Faces Pain Score: 0-No pain Faces Pain Scale: No hurt  Therapy/Group: Individual Therapy  Darrol Poke Keanna Tugwell 06/09/2020, 12:05 PM

## 2020-06-09 NOTE — Progress Notes (Signed)
Patient's wife refused family education about colostomy care, she said she has been doing it for six years. We continue to monitor.

## 2020-06-09 NOTE — Progress Notes (Signed)
Physical Therapy Discharge Summary  Patient Details  Name: Jeffery Roach MRN: [id number] Date of Birth: 08-Nov-1927   Patient has met 8 of 8 long term goals due to improved balance, improved postural control, increased strength, ability to compensate for deficits and improved coordination.  Patient to discharge at an ambulatory level Supervision.   Patient's care partner is independent to provide the necessary supervision assistance at discharge.  Reasons goals not met: Goals met  Recommendation:  Patient will benefit from ongoing skilled PT services in outpatient setting to continue to advance safe functional mobility, address ongoing impairments in safety, balance, R awareness and R UE use, and minimize fall risk.  Equipment: No equipment provided  Reasons for discharge: treatment goals met and discharge from hospital  Patient/family agrees with progress made and goals achieved: Yes  PT Discharge Precautions/Restrictions Precautions Precautions: Fall Precaution Comments: R hemi, sacral wound, colostomy Restrictions Weight Bearing Restrictions: No Pain Pain Assessment Pain Scale: Faces Pain Score: 0-No pain Faces Pain Scale: No hurt Vision/Perception  Perception Perception: Impaired Inattention/Neglect: Does not attend to right side of body Spatial Orientation: mild R inattention (likely from prior stroke) Praxis Praxis: Intact  Cognition Overall Cognitive Status: History of cognitive impairments - at baseline Arousal/Alertness: Awake/alert Orientation Level: Oriented X4 Attention: Sustained Problem Solving: Appears intact Safety/Judgment: Appears intact Sensation Sensation Light Touch: Impaired by gross assessment Additional Comments: decreased R attention and R sensation decreased to light touch Coordination Gross Motor Movements are Fluid and Coordinated: No Fine Motor Movements are Fluid and Coordinated: No Coordination and Movement Description: decreased  coordination to toe taps (slower) and heel to shin (slower and difficulty with placement) Motor  Motor Motor: Hemiplegia;Motor apraxia Motor - Skilled Clinical Observations: R UE hemiplegia, decreased R side awareness/apraxia R UE, significant dysarthria  Mobility Bed Mobility Bed Mobility: Sit to Supine;Supine to Sit Supine to Sit: Independent Sit to Supine: Independent Transfers Transfers: Sit to Stand;Stand to Sit Sit to Stand: Independent with assistive device Stand to Sit: Independent with assistive device Stand Pivot Transfers: Supervision/Verbal cueing Stand Pivot Transfer Details (indicate cue type and reason): assist for safety due to limited R side attention Transfer (Assistive device): None Locomotion  Gait Ambulation: Yes Gait Assistance: Supervision/Verbal cueing Assistive device: None Gait Gait: Yes Gait Pattern: Impaired Gait Pattern: Decreased stride length;Decreased stance time - right;Step-through pattern;Wide base of support;Trunk flexed;Poor foot clearance - right Gait velocity: 21min 6 min (728') = 0.61 m/s Stairs / Additional Locomotion Stairs: Yes Stairs Assistance: Supervision/Verbal cueing Stair Management Technique: One rail Left Number of Stairs: 12 Height of Stairs: 6 Ramp: Supervision/Verbal cueing Wheelchair Mobility Wheelchair Mobility: No  Trunk/Postural Assessment  Cervical Assessment C[facility]ical Assessment: [facility] to [facility]) Thoracic Assessment Thoracic Assessment: Exceptions to [facility]) Lumbar Assessment Lumbar Assessment: Exceptions to [facility](posterior [facility] tilt) Postural Control Postural Control: Within Functional Limits  Balance Standardized Balance Assessment Standardized Balance Assessment: Berg Balance Test Berg Balance Test Sit to Stand: Able to stand without using hands and stabilize independently Standing Unsupported: Able to stand safely 2 minutes Sitting with Back Unsupported but Feet  Supported on Floor or Stool: Able to sit safely and securely 2 minutes Stand to Sit: Sits safely with minimal use of hands Transfers: Able to transfer safely, minor use of hands Standing Unsupported with Eyes Closed: Able to stand 10 seconds safely Standing Ubsupported with Feet Together: Able to place feet together independently and stand 1 minute safely From Standing, Reach Forward with Outstretched Arm: Can reach confidently >25  cm (10") From Standing Position, Pick up Object from Floor: Able to pick up shoe, needs supervision From Standing Position, Turn to Look Behind Over each Shoulder: Looks behind from both sides and weight shifts well Turn 360 Degrees: Able to turn 360 degrees safely one side only in 4 seconds or less Standing Unsupported, Alternately Place Feet on Step/Stool: Able to complete >2 steps/needs minimal assist Standing Unsupported, One Foot in Front: Able to plae foot ahead of the other independently and hold 30 seconds Standing on One Leg: Tries to lift leg/unable to hold 3 seconds but remains standing independently Total Score: 47 Static Sitting Balance Static Sitting - Balance Support: Feet supported;No upper extremity supported Static Sitting - Level of Assistance: 7: Independent Dynamic Sitting Balance Dynamic Sitting - Balance Support: No upper extremity supported;Left upper extremity supported Dynamic Sitting - Level of Assistance: 7: Independent Dynamic Sitting - Balance Activities: Lateral lean/weight shifting;Forward lean/weight shifting;Reaching across midline;Reaching for objects Sitting balance - Comments: donning pants and shoes at EOB Static Standing Balance Static Standing - Balance Support: No upper extremity supported;During functional activity Static Standing - Level of Assistance: 6: Modified independent (Device/Increase time) Dynamic Standing Balance Dynamic Standing - Balance Support: No upper extremity supported;During functional activity Dynamic  Standing - Level of Assistance: 6: Modified independent (Device/Increase time) Dynamic Standing - Comments: brushing teeth, combing hair, washing face at sink Extremity Assessment      RLE Assessment RLE Assessment: Exceptions to [facility] Active Range of Motion (AROM) Comments: [facility] General Strength Comments: hip flexion 4-/5, knee extension 4+/5, ankle DF 4/5 LLE Assessment LLE Assessment: Within Functional Limits Active Range of Motion (AROM) Comments: [facility] General Strength Comments: hip flexion 4+/5, knee extension 5/5, ankle DF 5/5    Reginia Naas  [place], PT 06/09/2020, 12:21 PM

## 2020-06-09 NOTE — Progress Notes (Signed)
Occupational Therapy Discharge Summary  Patient Details  Name: Jeffery Roach MRN: [id number] Date of Birth: Mar 20, 1927  Patient has met 6 of 10 long term goals due to improved activity tolerance, improved balance, postural control, ability to compensate for deficits, functional use of  RIGHT upper extremity and improved awareness.  Patient to discharge at overall Supervision level.  Patient's care partner unavailable to attend family education with OT but reported to be able to provide the necessary physical assistance at discharge as pt recommended to require close spvsn in standing ADLs and transfers.    Reasons goals not met: 3 of 4 LTGs not met were at 'adequate for d/c' level due to pt's decreased balance skills in standing. Pt is potentially capable of completing these BADL tasks at mod I level, but recommend close spvsn for d/c due to balance deficits and R inattention for safe d/c home.   Recommendation:  Patient will benefit from ongoing skilled OT services in outpatient setting to continue to advance functional skills in the area of BADL, iADL and Reduce care partner burden and to improve functional use of RUE.  Equipment: No equipment provided. Recommend shower seat/chair for seated showering at home.   Reasons for discharge: discharge from hospital  Patient/family agrees with progress made and goals achieved: Yes  OT Discharge Precautions/Restrictions  Precautions Precautions: Fall Precaution Comments: R hemi, sacral wound, colostomy Restrictions Weight Bearing Restrictions: No Pain Pain Assessment Pain Scale: 0-10 Pain Score: 0-No pain Faces Pain Scale: No hurt ADL ADL Eating: Set up Where Assessed-Eating: Chair Grooming: Supervision/safety Where Assessed-Grooming: Standing at sink Upper Body Bathing: Supervision/safety Where Assessed-Upper Body Bathing: Shower,Other (Comment) (TTB) Lower Body Bathing: Supervision/safety Where Assessed-Lower Body Bathing:  Shower,Other (Comment) (TTB) Upper Body Dressing: Setup Where Assessed-Upper Body Dressing: Edge of bed Lower Body Dressing: Supervision/safety Where Assessed-Lower Body Dressing: Edge of bed,Other (Comment) (seated and standing, no AD) Toileting: Supervision/safety Where Assessed-Toileting: Glass blower/designer: Close supervision Toilet Transfer Method: Counselling psychologist: Energy manager: Close supervision Social research officer, government Method: Heritage manager: Transfer tub bench,Grab bars ADL Comments: Standing ADLs and transfers continue at close spvsn level at d/c due to balance deficits. Vision Baseline Vision/History: Wears glasses Wears Glasses: At all times Patient Visual Report: No change from baseline Perception  Perception: Impaired Inattention/Neglect: Does not attend to right side of body Spatial Orientation: mild R inattention (likely from prior stroke) Praxis Praxis: Intact Cognition Overall Cognitive Status: History of cognitive impairments - at baseline Arousal/Alertness: Awake/alert Orientation Level: Oriented X4 Attention: Sustained Sustained Attention: Appears intact Memory: Impaired Memory Impairment: Retrieval deficit;Decreased short term memory Decreased Short Term Memory: Verbal basic;Functional basic Awareness: Appears intact Problem Solving: Appears intact Executive Function: Sequencing Sequencing: Appears intact Safety/Judgment: Appears intact Sensation Sensation Light Touch: Impaired by gross assessment Hot/Cold: Impaired by gross assessment Proprioception: Impaired by gross assessment Stereognosis: Impaired by gross assessment Additional Comments: decreased R attention and R sensation decreased to light touch Coordination Gross Motor Movements are Fluid and Coordinated: No Fine Motor Movements are Fluid and Coordinated: No Coordination and Movement Description: decreased coordination and  RUE gross and fine motor control Motor  Motor Motor: Hemiplegia;Motor apraxia Motor - Skilled Clinical Observations: R UE hemiplegia, decreased R side awareness/apraxia R UE, significant dysarthria Motor - Discharge Observations: R UE hemiplegia, decreased R side awareness/apraxia R UE, significant dysarthria Mobility  Bed Mobility Bed Mobility: Sit to Supine;Supine to Sit Supine to Sit: Independent Sit to Supine: Independent Transfers Sit to Stand: Independent with  assistive device Stand to Sit: Independent with assistive device  Trunk/Postural Assessment  Cervical Assessment Cervical Assessment: Exceptions to [facility] (forward head) Thoracic Assessment Thoracic Assessment: Exceptions to [facility] (rounded shoulders) Lumbar Assessment Lumbar Assessment: Exceptions to [facility] Postural Control Postural Control: Deficits on evaluation  Balance Balance Balance Assessed: Yes Standardized Balance Assessment Standardized Balance Assessment: Berg Balance Test Berg Balance Test Sit to Stand: Able to stand without using hands and stabilize independently Standing Unsupported: Able to stand safely 2 minutes Sitting with Back Unsupported but Feet Supported on Floor or Stool: Able to sit safely and securely 2 minutes Stand to Sit: Sits safely with minimal use of hands Transfers: Able to transfer safely, minor use of hands Standing Unsupported with Eyes Closed: Able to stand 10 seconds safely Standing Ubsupported with Feet Together: Able to place feet together independently and stand 1 minute safely From Standing, Reach Forward with Outstretched Arm: Can reach confidently >25 cm (10") From Standing Position, Pick up Object from Floor: Able to pick up shoe, needs supervision From Standing Position, Turn to Look Behind Over each Shoulder: Looks behind from both sides and weight shifts well Turn 360 Degrees: Able to turn 360 degrees safely one side only in 4 seconds or less Standing Unsupported, Alternately  Place Feet on Step/Stool: Able to complete >2 steps/needs minimal assist Standing Unsupported, One Foot in Front: Able to plae foot ahead of the other independently and hold 30 seconds Standing on One Leg: Tries to lift leg/unable to hold 3 seconds but remains standing independently Total Score: 47 Static Sitting Balance Static Sitting - Balance Support: Feet supported;No upper extremity supported Static Sitting - Level of Assistance: 7: Independent Dynamic Sitting Balance Dynamic Sitting - Balance Support: No upper extremity supported;Left upper extremity supported Dynamic Sitting - Level of Assistance: 7: Independent Dynamic Sitting - Balance Activities: Lateral lean/weight shifting;Forward lean/weight shifting;Reaching across midline;Reaching for objects Sitting balance - Comments: donning pants and shoes at EOB Static Standing Balance Static Standing - Balance Support: No upper extremity supported;During functional activity Static Standing - Level of Assistance: 6: Modified independent (Device/Increase time) Dynamic Standing Balance Dynamic Standing - Balance Support: No upper extremity supported;During functional activity Dynamic Standing - Level of Assistance: 6: Modified independent (Device/Increase time) Dynamic Standing - Balance Activities: Lateral lean/weight shifting;Forward lean/weight shifting;Reaching for objects;Reaching across midline Dynamic Standing - Comments: brushing teeth, combing hair, washing face at sink Extremity/Trunk Assessment RUE Assessment RUE Assessment: Exceptions to [facility] Passive Range of Motion (PROM) Comments: <full range. ~100 degrees before pain; wrist <full before pain Active Range of Motion (AROM) Comments: ~90 degrees shoulder FLX, elbow WFL, trace in wrist, finger flx WFL but <full range in ext General Strength Comments: 3- strength shoulder flx/abd, 4- elbow flx/ext, grip strength poor RUE Body System: Neuro Brunstrum levels for arm and hand:  Arm;Hand Brunstrum level for arm: Stage IV Movement is deviating from synergy;Stage V Relative Independence from Synergy Brunstrum level for hand: Stage III Synergies performed voluntarily RUE AROM (degrees) Overall AROM Right Upper Extremity: Deficits RUE Strength Right Hand Gross Grasp: Impaired RUE Tone RUE Tone: Modified Ashworth Body Part - Modified Ashworth Scale: Elbow;Wrist;Fingers Elbow - Modified Ashworth Scale for Grading Hypertonia RUE: Slight increase in muscle tone, manifested by a catch and release or by minimal resistance at the end of the range of motion when the affected part(s) is moved in flexion or extension Wrist - Modified Ashworth Scale for Grading Hypertonia RUE: Slight increase in muscle tone, manifested by a catch and release or by  minimal resistance at the end of the range of motion when the affected part(s) is moved in flexion or extension Modified Ashworth Scale for Grading Hypertonia RUE: Slight increase in muscle tone, manifested by a catch and release or by minimal resistance at the end of the range of motion when the affected part(s) is moved in flexion or extension LUE Assessment LUE Assessment: Within Functional Limits   Mellissa Kohut 06/09/2020, 12:43 PM

## 2020-06-09 NOTE — Progress Notes (Signed)
Occupational Therapy Session Note  Patient Details  Name: Jeffery Roach MRN: [id number] Date of Birth: 08/17/1927  Today's Date: 06/09/2020 OT Individual Time: 0945-1044 OT Individual Time Calculation (min): 59 min    Short Term Goals: Week 1:  OT Short Term Goal 1 (Week 1): Pt will complete 3/3 toileting tasks with close spvsn using LRAD - toilet or BSC level. OT Short Term Goal 2 (Week 1): Pt will complete LB adls with close spvsn consistently. OT Short Term Goal 3 (Week 1): Pt will complete dynamic standing adl with no LOB using LRAD. OT Short Term Goal 4 (Week 1): Pt will use RUE as stabilizer in grooming task without cues for initiation.  Skilled Therapeutic Interventions/Progress Updates:    Pt received in recliner ready for therapy. Pt was already dressed and ready for the day and he did not want to undress to shower. He said he got freshened up already.  Pt did need to toilet and was able to ambulate without AD to toilet with supervision, he stood up and managed clothing with mod I to urinate in standing.  Ambulated to sink to wash hands with mod I.   Pt worked on standing balance with dynamic movement patterns to increase safety in standing with toileting/ LB dressing: - with use of hula hoop, pt held hoop in B hands and twisted side to side for dynamic trunk rotation 12x, then moved hoop down to shins and up to hips (as if pulling up pants) 12x, and then "driving" hoop rotating it side to side for gentle R shoulder ROM 12x.  Pt rested briefly and then completed 2 mores sets of each of the exercises.   Pt worked on R elbow AROM with standing bicep curls with arms in pronation.  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  Attempted a cup stacking activity with small medicine cups, but too difficult for him to accomplish.   Pt participated well. Resting in recliner with belt alarm on and all needs met.    Therapy Documentation Precautions:   Precautions Precautions: Fall Precaution Comments: R hemi, sacral wound Restrictions Weight Bearing Restrictions: No  Pain: Pain Assessment Pain Scale: 0-10 Pain Score: 0-No pain ADL: ADL Eating: Set up Where Assessed-Eating: Bed level Grooming: Supervision/safety Where Assessed-Grooming: Standing at sink Upper Body Bathing: Supervision/safety Where Assessed-Upper Body Bathing: Shower,Other (Comment) (TTB) Lower Body Bathing: Supervision/safety Where Assessed-Lower Body Bathing: Shower,Other (Comment) (seated and standing) Upper Body Dressing: Setup Where Assessed-Upper Body Dressing: Edge of bed Lower Body Dressing: Supervision/safety,Contact guard Where Assessed-Lower Body Dressing: Edge of bed,Other (Comment) (seated and standing no AD) Toileting: Not assessed (pt has been using urinal in bed) Toilet Transfer: Contact guard,Close supervision Toilet Transfer Method: Ambulating Toilet Transfer Equipment: Grab bars Walk-In Shower Transfer: Close supervision,Contact guard Walk-In Shower Transfer Method: Ambulating Walk-In Shower Equipment: Transfer tub bench,Grab bars ADL Comments: Standing ADLs and transfers requiring intermittent CGA due to poor dynamic and static standing balance   Therapy/Group: Individual Therapy  SAGUIER,JULIA 06/09/2020, 8:56 AM 

## 2020-06-09 NOTE — Plan of Care (Signed)
  Problem: RH Balance Goal: LTG Patient will maintain dynamic standing balance (PT) Description: LTG:  Patient will maintain dynamic standing balance with assistance during mobility activities (PT) Outcome: Completed/Met   Problem: Sit to Stand Goal: LTG:  Patient will perform sit to stand with assistance level (PT) Description: LTG:  Patient will perform sit to stand with assistance level (PT) Outcome: Completed/Met   Problem: RH Bed Mobility Goal: LTG Patient will perform bed mobility with assist (PT) Description: LTG: Patient will perform bed mobility with assistance, with/without cues (PT). Outcome: Completed/Met   Problem: RH Bed to Chair Transfers Goal: LTG Patient will perform bed/chair transfers w/assist (PT) Description: LTG: Patient will perform bed to chair transfers with assistance (PT). Outcome: Completed/Met   Problem: RH Car Transfers Goal: LTG Patient will perform car transfers with assist (PT) Description: LTG: Patient will perform car transfers with assistance (PT). Outcome: Completed/Met   Problem: RH Ambulation Goal: LTG Patient will ambulate in controlled environment (PT) Description: LTG: Patient will ambulate in a controlled environment, # of feet with assistance (PT). Outcome: Completed/Met Goal: LTG Patient will ambulate in home environment (PT) Description: LTG: Patient will ambulate in home environment, # of feet with assistance (PT). Outcome: Completed/Met   Problem: RH Stairs Goal: LTG Patient will ambulate up and down stairs w/assist (PT) Description: LTG: Patient will ambulate up and down # of stairs with assistance (PT) Outcome: Completed/Met  Magda Kiel, PT

## 2020-06-09 NOTE — Progress Notes (Signed)
Physical Therapy Session Note  Patient Details  Name: Jeffery Roach MRN: [id number] Date of Birth: [birth year]  Today's Date: 06/09/2020 PT Individual Time:Session1: 9381-0175; Session2:1500-1600  PT Individual Time Calculation (min): 60 min & 60 min  Short Term Goals: Week 1:  PT Short Term Goal 1 (Week 1): STG=LTG due to ELOS  Skilled Therapeutic Interventions/Progress Updates:    Session1:  Patient in supine and denies any issues.  Performed supine to sit independently.  Donned pants and shoes with S at EOB.  Patient sit to stand independent throughout session.  Ambulated to sink with no device x 12' with S.  Standing for brushing teeth, washing face and combing hair at sink with S.  Patient ambulated x 6' with S to Dayroom.  Performed Berg balance assessment as noted below, scored 47/56.  Patient seated for strength/sensation assessment.  Patient performed 6 min walk test, ambulated 222 meters (728 ft.)  Patient ambulating with wide BOS, decreased R stance time, stride length and knee flexion bilaterally.  Patient returned to room ambulating another 750' with one seated rest with S and no AD.  Left seated in recliner with call bell and needs in reach and belt alarm active.  Patient with BP 169/83, discussed elevation with activity normal, but should re-check soon.   Session2: Patient in recliner in room and denies that wife is coming today for education.  Patient transfers throughout session independently.  Ambulated to therapy gym 180' with S and increased time.  Reports knee pain this pm due to walking so much this am, but states it is okay.  Patient negotiated 8 steps with L rail and S.  Wife and daughter arrived and able to see pt negotiate steps with L rail with S and discussed level of assistance for safety always on the "down side" of patient.  Patient ambulated to ortho gym and demonstrated transfer to car simulated at [employer] with S.  Patient and family educated on fall  prevention techniques including footwear, lighting, clear pathways and items frequently used within reach. OT in to educate pt and family to use shower chair at least initially when showering for safety due to balance issues.  Daughter reported did not get info on discharge date yet.  [name] had informed her of education today and came, but stated they were not sure of the discharge date.  Informed her team had met and planned for tomorrow.  Daughter concerned needed to inform her job, but later said it was okay.  Attempted to find SW, but she had left for the day so RN informed of need to clear up d/c date with family and to remove IV from L hand due to tape coming off.  Patient ambulated to room with S and seated in recliner with alarm belt and family in the room.   Therapy Documentation Precautions:  Precautions Precautions: Fall Precaution Comments: R hemi, sacral wound Restrictions Weight Bearing Restrictions: No Pain: Pain Assessment Pain Scale: 0-10 Pain Score: 0-No pain  Balance: Standardized Balance Assessment Standardized Balance Assessment: Berg Balance Test Berg Balance Test Sit to Stand: Able to stand without using hands and stabilize independently Standing Unsupported: Able to stand safely 2 minutes Sitting with Back Unsupported but Feet Supported on Floor or Stool: Able to sit safely and securely 2 minutes Stand to Sit: Sits safely with minimal use of hands Transfers: Able to transfer safely, minor use of hands Standing Unsupported with Eyes Closed: Able to stand 10 seconds safely Standing Ubsupported  with Feet Together: Able to place feet together independently and stand 1 minute safely From Standing, Reach Forward with Outstretched Arm: Can reach confidently >25 cm (10") From Standing Position, Pick up Object from Floor: Able to pick up shoe, needs supervision From Standing Position, Turn to Look Behind Over each Shoulder: Looks behind from both sides and weight shifts  well Turn 360 Degrees: Able to turn 360 degrees safely one side only in 4 seconds or less Standing Unsupported, Alternately Place Feet on Step/Stool: Able to complete >2 steps/needs minimal assist Standing Unsupported, One Foot in Front: Able to plae foot ahead of the other independently and hold 30 seconds Standing on One Leg: Tries to lift leg/unable to hold 3 seconds but remains standing independently Total Score: 47    Therapy/Group: Individual Therapy  Jeffery Roach  [place], PT 06/09/2020, 8:28 AM

## 2020-06-09 NOTE — Plan of Care (Signed)
  Problem: Sit to Stand Goal: LTG:  Patient will perform sit to stand in prep for activites of daily living with assistance level (OT) Description: LTG:  Patient will perform sit to stand in prep for activites of daily living with assistance level (OT) Outcome: Adequate for Discharge   Problem: RH Grooming Goal: LTG Patient will perform grooming w/assist,cues/equip (OT) Description: LTG: Patient will perform grooming with assist, with/without cues using equipment (OT) Outcome: Adequate for Discharge   Problem: RH Dressing Goal: LTG Patient will perform upper body dressing (OT) Description: LTG Patient will perform upper body dressing with assist, with/without cues (OT). Outcome: Adequate for Discharge   Problem: RH Dressing Goal: LTG Patient will perform lower body dressing w/assist (OT) Description: LTG: Patient will perform lower body dressing with assist, with/without cues in positioning using equipment (OT) Outcome: Not Met (add Reason) Note: Pt requires close spvsn in standing at d/c due to balance deficits.   Problem: RH Balance Goal: LTG Patient will maintain dynamic standing with ADLs (OT) Description: LTG:  Patient will maintain dynamic standing balance with assist during activities of daily living (OT)  Outcome: Completed/Met   Problem: RH Bathing Goal: LTG Patient will bathe all body parts with assist levels (OT) Description: LTG: Patient will bathe all body parts with assist levels (OT) Outcome: Completed/Met   Problem: RH Toileting Goal: LTG Patient will perform toileting task (3/3 steps) with assistance level (OT) Description: LTG: Patient will perform toileting task (3/3 steps) with assistance level (OT)  Outcome: Completed/Met   Problem: RH Functional Use of Upper Extremity Goal: LTG Patient will use RT/LT upper extremity as a (OT) Description: LTG: Patient will use right/left upper extremity as a stabilizer/gross assist/diminished/nondominant/dominant level with  assist, with/without cues during functional activity (OT) Outcome: Completed/Met   Problem: RH Toilet Transfers Goal: LTG Patient will perform toilet transfers w/assist (OT) Description: LTG: Patient will perform toilet transfers with assist, with/without cues using equipment (OT) Outcome: Completed/Met   Problem: RH Tub/Shower Transfers Goal: LTG Patient will perform tub/shower transfers w/assist (OT) Description: LTG: Patient will perform tub/shower transfers with assist, with/without cues using equipment (OT) Outcome: Completed/Met

## 2020-06-10 DIAGNOSIS — N189 Chronic kidney disease, unspecified: Secondary | ICD-10-CM

## 2020-06-10 LAB — GLUCOSE, CAPILLARY: Glucose-Capillary: 116 mg/dL — ABNORMAL HIGH (ref 70–99)

## 2020-06-10 MED ORDER — AMIODARONE HCL 100 MG PO TABS
100.0000 mg | ORAL_TABLET | Freq: Every day | ORAL | 0 refills | Status: AC
Start: 2020-06-10 — End: ?

## 2020-06-10 MED ORDER — SIMETHICONE 80 MG PO CHEW: 80.0000 mg | CHEWABLE_TABLET | Freq: Four times a day (QID) | ORAL | 0 refills | Status: AC | PRN

## 2020-06-10 MED ORDER — CALCIUM CITRATE 950 (200 CA) MG PO TABS: 200.0000 mg | ORAL_TABLET | Freq: Every day | ORAL | Status: AC

## 2020-06-10 MED ORDER — SENNOSIDES-DOCUSATE SODIUM 8.6-50 MG PO TABS: 1.0000 | ORAL_TABLET | Freq: Every day | ORAL | Status: AC

## 2020-06-10 MED ORDER — APIXABAN 2.5 MG PO TABS: 2.5000 mg | ORAL_TABLET | Freq: Two times a day (BID) | ORAL | 0 refills | Status: AC

## 2020-06-10 MED ORDER — ACETAMINOPHEN 325 MG PO TABS: 325.0000 mg | ORAL_TABLET | ORAL | Status: AC | PRN

## 2020-06-10 NOTE — Progress Notes (Signed)
[facility] PHYSICAL MEDICINE & REHABILITATION PROGRESS NOTE  Subjective/Complaints: Pt up inbed. Excited to be going home. No complaints  ROS: Patient denies fever, rash, sore throat, blurred vision, nausea, vomiting, diarrhea, cough, shortness of breath or chest pain, joint or back pain, headache, or mood change.    Objective: Vital Signs: Blood pressure (!) 155/81, pulse 60, temperature 98 F (36.7 C), resp. rate 18, height 6\' 3"  (1.905 m), weight 103.5 kg, SpO2 97 %. No results found. No results for input(s): WBC, HGB, HCT, PLT in the last 72 hours.  No results for input(s): NA, K, CL, CO2, GLUCOSE, BUN, CREATININE, CALCIUM in the last 72 hours.   Intake/Output Summary (Last 24 hours) at 06/10/2020 1038 Last data filed at 06/10/2020 0845 Gross per 24 hour  Intake 957 ml  Output 1850 ml  Net -893 ml     Pressure Injury 05/31/20 Coccyx Mid Stage 2 -  Partial thickness loss of dermis presenting as a shallow open injury with a red, pink wound bed without slough. area of breakdown just above rectum in gluteal fold (Active)  05/31/20 0240  Location: Coccyx  Location Orientation: Mid  Staging: Stage 2 -  Partial thickness loss of dermis presenting as a shallow open injury with a red, pink wound bed without slough.  Wound Description (Comments): area of breakdown just above rectum in gluteal fold  Present on Admission: Yes    Physical Exam: BP (!) 155/81   Pulse 60   Temp 98 F (36.7 C)   Resp 18   Ht 6\' 3"  (1.905 m)   Wt 103.5 kg   SpO2 97%   BMI 28.52 kg/m   Constitutional: No distress . Vital signs reviewed. HEENT: EOMI, oral membranes moist Neck: supple Cardiovascular: RRR without murmur. No JVD    Respiratory/Chest: CTA Bilaterally without wheezes or rales. Normal effort    GI/Abdomen: BS +, non-tender, non-distended Ext: no clubbing, cyanosis, or edema Psych: pleasant and cooperative Skin: intact Neuro: Alert and oriented Right facial weakness Severe dysarthria  with aphasia Motor: RUE: Shoulder abduction 2+/5, distally 3/5--motor planning issues RLE: 4/5 prox to distal.    Assessment/Plan: 1. Functional deficits which require 3+ hours per day of interdisciplinary therapy in a comprehensive inpatient rehab setting. Physiatrist is providing close team supervision and 24 hour management of active medical problems listed below. Physiatrist and rehab team continue to assess barriers to discharge/monitor patient progress toward functional and medical goals   Care Tool:  Bathing    Body parts bathed by patient: Right arm, Left arm, Chest, Abdomen, Front perineal area, Buttocks, Right upper leg, Left upper leg, Right lower leg, Left lower leg, Face         Bathing assist Assist Level: Supervision/Verbal cueing     Upper Body Dressing/Undressing Upper body dressing   What is the patient wearing?: Pull over shirt    Upper body assist Assist Level: Set up assist    Lower Body Dressing/Undressing Lower body dressing      What is the patient wearing?: Underwear/pull up, Pants     Lower body assist Assist for lower body dressing: Supervision/Verbal cueing     Toileting Toileting    Toileting assist Assist for toileting: Supervision/Verbal cueing Assistive Device Comment: (P) urinal   Transfers Chair/bed transfer  Transfers assist     Chair/bed transfer assist level: Supervision/Verbal cueing     Locomotion Ambulation   Ambulation assist      Assist level: Supervision/Verbal cueing Assistive device: No Device Max  distance: 700   Walk 10 feet activity   Assist     Assist level: Supervision/Verbal cueing Assistive device: No Device   Walk 50 feet activity   Assist    Assist level: Contact Guard/Touching assist Assistive device: No Device    Walk 150 feet activity   Assist    Assist level: Supervision/Verbal cueing Assistive device: No Device    Walk 10 feet on uneven surface  activity   Assist      Assist level: Supervision/Verbal cueing Assistive device:  (no device)   Wheelchair     Assist Will patient use wheelchair at discharge?: No   Wheelchair activity did not occur: N/A         Wheelchair 50 feet with 2 turns activity    Assist    Wheelchair 50 feet with 2 turns activity did not occur: N/A       Wheelchair 150 feet activity     Assist  Wheelchair 150 feet activity did not occur: N/A        Medical Problem List and Plan: 1.  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 F/u [facility] in 4 weeks 2.  Antithrombotics: -DVT/anticoagulation:  Pharmaceutical: Other (comment)--now on Eliquis renal dose             -antiplatelet therapy: low dose ASA 3. Pain Management: N/A 4. Mood: LCSW to follow for evaluation and support.              -antipsychotic agents: N/a 5. Neuropsych: This patient maybe is capable of making decisions on his own behalf. 6. Skin/Wound Care: Routine pressure relief measures  7. Fluids/Electrolytes/Nutrition: strict I/Os.  8.  Chronic A fib: Continue amiodarone for NSVT/rate control (had bradycardia with BB)             Monitor HR TID and follow up with Dr. Terrence Dupont after d/c  controlled 9. T2DM with hyperglycemia:   -sugars under reasonable control  Hgb A1C reviewed 6/4: 8.4.      dapagligozin can be taken with CKD--continue.   Resume glipizide if needed.              --Monitor BS ac/hs and continue SSI for elevated BS.              --Carb modified/heart healthy restrictions added.   -reasonable 6/9---resume glipizide as outpt when CBG's increase CBG (last 3)  Recent Labs    06/09/20 1715 06/09/20 2114 06/10/20 0553  GLUCAP 154* 135* 116*    10. CKDIIIb:   Creatinine 1.92 on 6/6, continue to monitor 11. CAD s/p CABG/ICM with EF 40-45%: Monitor for  signs of overload.              Was on dapaglifozin (resume for cardiac/DM), Lasix and atorvastatin PTA.   6/5:   20mg  lasix today   6/9weights back to 103kg---continue same meds Filed Weights   06/08/20 0409 06/09/20 0309 06/10/20 0429  Weight: 103.5 kg 104 kg 103.5 kg   13. Constipation: Added 1senna S at bedtime to help soften stools.  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 HTN: Lasix as #11  .    LOS: 7 days A FACE TO FACE EVALUATION WAS PERFORMED  Meredith Staggers 06/10/2020, 10:38 AM

## 2020-06-10 NOTE — Progress Notes (Signed)
Patient ID: Jeffery Roach, male   DOB: 05-17-1927, 87 y.o.   MRN: [id number]  Spoke with wife via telephone to discuss how family education went and if wants home health versus OP. Wife prefers [facility] since has had before. Made referral and will contact wife to set up appointments. Wife to get tub seat on her own due to not covered.

## 2020-06-10 NOTE — Discharge Instructions (Addendum)
Inpatient Rehab Discharge Instructions  [place] [facility] Discharge date and time: 06/10/20   Activities/Precautions/ Functional Status: Activity: no lifting, driving, or strenuous exercise for till cleared by MD Diet: cardiac diet Wound Care: none needed   Functional status:  ___ No restrictions     ___ Walk up steps independently _X__ 24/7 supervision/assistance   ___ Walk up steps with assistance ___ Intermittent supervision/assistance  ___ Bathe/dress independently ___ Walk with walker     ___ Bathe/dress with assistance ___ Walk Independently    ___ Shower independently ___ Walk with assistance    _X__ Shower with assistance _X__ No alcohol     ___ Return to work/school ________  Special Instructions:  COMMUNITY REFERRALS UPON DISCHARGE:    Home Health:   PT, OT, SP                Agency: [facility] Phone [phone]     Medical Equipment/Items Ordered: TO GET ON OWN-TUB SEAT                                                 Agency/Supplier: NA   STROKE/TIA DISCHARGE INSTRUCTIONS SMOKING Cigarette smoking nearly doubles your risk of having a stroke & is the single most alterable risk factor  If you smoke or have smoked in the last 12 months, you are advised to quit smoking for your health. Most of the excess cardiovascular risk related to smoking disappears within a year of stopping. Ask you doctor about anti-smoking medications [place] Quit Line: 1-800-QUIT NOW Free Smoking Cessation Classes [phone]  CHOLESTEROL Know your levels; limit fat & cholesterol in your diet  Lipid Panel     Component Value Date/Time   CHOL 102 06/01/2020 0300   TRIG 49 06/01/2020 0300   HDL 38 (L) 06/01/2020 0300   CHOLHDL 2.7 06/01/2020 0300   VLDL 10 06/01/2020 0300   LDLCALC 54 06/01/2020 0300     Many patients benefit from treatment even if their cholesterol is at goal. Goal: Total Cholesterol (CHOL) less than 160 Goal:  Triglycerides (TRIG) less than 150 Goal:  HDL  greater than 40 Goal:  LDL (LDLCALC) less than 100   BLOOD PRESSURE American Stroke Association blood pressure target is less that 120/80 mm/Hg  Your discharge blood pressure is:  BP: (!) 155/81 Monitor your blood pressure Limit your salt and alcohol intake Many individuals will require more than one medication for high blood pressure  DIABETES (A1c is a blood sugar average for last 3 months) Goal HGBA1c is under 7% (HBGA1c is blood sugar average for last 3 months)  Diabetes:     Lab Results  Component Value Date   HGBA1C 8.4 (H) 06/04/2020    Your HGBA1c can be lowered with medications, healthy diet, and exercise. Check your blood sugar as directed by your physician Call your physician if you experience unexplained or low blood sugars.  PHYSICAL ACTIVITY/REHABILITATION Goal is 30 minutes at least 4 days per week  Activity: Increase activity slowly, and No driving, Therapies: see above Return to work: N/A Activity decreases your risk of heart attack and stroke and makes your heart stronger.  It helps control your weight and blood pressure; helps you relax and can improve your mood. Participate in a regular exercise program. Talk with your doctor about the best form of exercise for you (dancing, walking,  swimming, cycling).  DIET/WEIGHT Goal is to maintain a healthy weight  Your discharge diet is:  Diet Order             DIET DYS 3 Room service appropriate? Yes; Fluid consistency: Thin  Diet effective now                   liquids Your height is:  Height: 6\' 3"  (190.5 cm) Your current weight is: Weight: 228 lbs Your Body Mass Index (BMI) is:  BMI (Calculated): 28.52 Following the type of diet specifically designed for you will help prevent another stroke. Your goal weight is 200 lbs Your goal Body Mass Index (BMI) is 19-24. Healthy food habits can help reduce 3 risk factors for stroke:  High cholesterol, hypertension, and excess weight.  RESOURCES Stroke/Support Group:  Call  [phone]   STROKE EDUCATION PROVIDED/REVIEWED AND GIVEN TO PATIENT Stroke warning signs and symptoms How to activate emergency medical system (call 911). Medications prescribed at discharge. Need for follow-up after discharge. Personal risk factors for stroke. Pneumonia vaccine given: Flu vaccine given:  My questions have been answered, the writing is legible, and I understand these instructions.  I will adhere to these goals & educational materials that have been provided to me after my discharge from the hospital.          My questions have been answered and I understand these instructions. I will adhere to these goals and the provided educational materials after my discharge from the hospital.  Patient/Caregiver Signature _______________________________ Date __________  Clinician Signature _______________________________________ Date __________  Please bring this form and your medication list with you to all your follow-up doctor's appointments.

## 2020-06-10 NOTE — Progress Notes (Signed)
Inpatient Rehabilitation Care Coordinator Discharge Note  The overall goal for the admission was met for:   Discharge location: Yes-HOME WITH WIFE WHO CAN PROVIDE 24/7 SUPERVISION  Length of Stay: Yes-7 DAYS  Discharge activity level: Yes-SUPERVISION LEVEL  Home/community participation: Yes  Services provided included: MD, RD, PT, OT, SLP, RN, CM, Pharmacy, and SW  Financial Services: Medicare and Private Insurance: [employer] offered to/list presented to:YES  Follow-up services arranged: Home Health: [facility] PT,OT,SP and Patient/Family request agency HH: YES, DME: NO NEEDS   Comments (or additional information):WIFE AND DAUGHTER [place]. WANT HOME HEALTH DUE TO TRANSPORT AND GAS PRICES  Patient/Family verbalized understanding of follow-up arrangements: Yes  Individual responsible for coordination of the follow-up plan: LOUISE-WIFE [phone]  Confirmed correct DME delivered: Elease Hashimoto 06/10/2020    Zenab [place], [place]

## 2020-06-10 NOTE — Progress Notes (Signed)
INPATIENT REHABILITATION DISCHARGE NOTE   Discharge instructions by: Reesa Chew  Verbalized understanding: By patient and family  Skin care/Wound care: skin intact  Pain: no pain  IV's: no IV's  Tubes/Drains: none  Safety instructions: reviewed   Patient belongings: clothing, glasses, cell phone. Collected by family.   Discharged to: home  Discharged via: family   Notes:

## 2020-06-10 NOTE — Discharge Summary (Signed)
Physician Discharge Summary  Patient ID: Jeffery Roach MRN: [id number] DOB/AGE: January 14, 1927 87 y.o.  Admit date: 06/03/2020 Discharge date: 06/10/2020  Discharge Diagnoses:  Principal Problem:   Cerebellar stroke [facility]) Active Problems:   Diabetes mellitus type II, controlled ([facility])   Dysarthria   Chronic atrial fibrillation ([facility])   Right hemiparesis (HCC)   Dysphagia, post-stroke   Chronic kidney disease   Discharged Condition: good  Significant Diagnostic Studies: N/a   Labs:  Basic Metabolic Panel: Recent Labs  Lab 06/04/20 0610 06/07/20 0736  NA 137 138  K 4.4 3.7  CL 107 106  CO2 20* 24  GLUCOSE 123* 187*  BUN 29* 29*  CREATININE 1.92* 1.92*  CALCIUM 9.6 9.3    CBC: Recent Labs  Lab 06/04/20 0610 06/07/20 0736  WBC 9.3 9.1  NEUTROABS 4.9  --   HGB 14.5 14.2  HCT 45.3 45.2  MCV 87.6 88.8  PLT 187 222    CBG: Recent Labs  Lab 06/09/20 0558 06/09/20 1135 06/09/20 1715 06/09/20 2114 06/10/20 0553  GLUCAP 114* 158* 154* 135* 116*    Brief HPI:   Jeffery Roach is a 87 y.o. male with history of CAD, T2DM, CKD 3, CVA with mild dysarthria and right residual hemiparesis, rectal/prostate cancer, CAF on Xarelto was admitted on 05/30/2020 with slurred speech and difficulty standing.  He was found to have small acute infarct in bilateral cerebellum, right occipital cortex and possibly right pons.  Stroke was felt to be embolic in setting of chronic A. fib and DOAC was changed to renal dose Eliquis.    Hospital course was significant for A. fib with RVR with occasional NSVT.  But beta-blocker added but he developed bradycardia with this therefore this was changed to amiodarone for rate control.  He continues to have limitations received with dysarthria with mild oropharyngeal dysphagia, balance deficits, diplopia as well as worsening of right hemiplegia.  CIR was recommended due to functional decline.   Hospital Course: Jeffery Roach was admitted to rehab  06/03/2020 for inpatient therapies to consist of PT and OT at least three hours five days a week. Past admission physiatrist, therapy team and rehab RN have worked together to provide customized collaborative inpatient rehab.  He was maintained on low-dose Eliquis and is tolerating this without side effects.  Follow-up CBC shows H&H to be relatively stable.  He has blood pressures were monitored on TID basis and has been controlled without medication.   His diabetes has been monitored with ac/hs CBG checks and blood sugars have been reasonably controlled on dapagligozin and diet alone.  Family was advised to monitor blood sugars achs basis and follow-up with endocrinology for further adjustment in his hypoglycemic regimen.  Follow-up check of BMET showed electrolytes to be within normal limits and CKD is stable.  He has made good gains during his stay and supervision is recommended for safety.  He will continue to receive follow-up home health PT, OT and [facility] by [facility] after discharge.   Rehab course: During patient's stay in rehab team conference was held to discuss  patient's progress, set goals and discuss barriers to discharge. At admission, patient required min assist with mobility and contact-guard assist to supervision for ADL tasks.  He exhibited severe dysarthria with 25% speech intelligibility and mild expressive aphasia. He has had improvement in activity tolerance, balance, postural control as well as ability to compensate for deficits.  He requires close supervision for ADL tasks.  He requires supervision for transfers due  to right inattention and to ambulate 200 feet without assistive device.  Speech intelligibility has been improving and he requires min visual and verbal cues for orientation.  Family education was completed with wife and daughter.  Discharge disposition: 01-Home or Self Care  Diet: Carb modified/heart healthy.  Special Instructions: Monitor blood sugars AC at  bedtime and follow-up with endocrinology in for further input. No driving or strenuous activity till cleared by MD.   Discharge Instructions     Ambulatory referral to Physical Medicine Rehab   Complete by: As directed    Hospital follow up      Allergies as of 06/10/2020   No Known Allergies      Medication List     STOP taking these medications    amLODipine 5 MG tablet Commonly known as: NORVASC   cholecalciferol 25 MCG (1000 UNIT) tablet Commonly known as: VITAMIN D   ferrous sulfate 325 (65 FE) MG tablet   furosemide 40 MG tablet Commonly known as: LASIX   glipiZIDE 10 MG 24 hr tablet Commonly known as: GLUCOTROL XL   pantoprazole 40 MG tablet Commonly known as: PROTONIX   potassium chloride 10 MEQ tablet Commonly known as: KLOR-CON       TAKE these medications    acetaminophen 325 MG tablet Commonly known as: TYLENOL Take 1-2 tablets (325-650 mg total) by mouth every 4 (four) hours as needed for mild pain.   amiodarone 100 MG tablet Commonly known as: PACERONE Take 1 tablet (100 mg total) by mouth daily.   apixaban 2.5 MG Tabs tablet Commonly known as: ELIQUIS Take 1 tablet (2.5 mg total) by mouth 2 (two) times daily.   aspirin EC 81 MG tablet Take 81 mg by mouth daily after breakfast.   atorvastatin 10 MG tablet Commonly known as: LIPITOR Take 1 tablet (10 mg total) by mouth daily. What changed: when to take this   calcium citrate 950 (200 Ca) MG tablet Commonly known as: CALCITRATE - dosed in mg elemental calcium Take 1 tablet (200 mg of elemental calcium total) by mouth daily. Start taking on: June 11, 2020   dapagliflozin propanediol 10 MG Tabs tablet Commonly known as: FARXIGA Take 10 mg by mouth daily before breakfast.   senna-docusate 8.6-50 MG tablet Commonly known as: Senokot-S Take 1 tablet by mouth daily with supper.   simethicone 80 MG chewable tablet Commonly known as: MYLICON Chew 1 tablet (80 mg total) by mouth 4  (four) times daily as needed for flatulence.        Follow-up Information     Meredith Staggers, MD. Call in 1 day(s).   Specialty: Physical Medicine and Rehabilitation Why: for post hospital follow up Contact information: [address] [place] [address] [phone]         Jonathon Resides, MD. Call in 1 day(s).   Specialty: Family Medicine Why: for post hospital follow up Contact information: [address] [place] [address] [phone]         [place]. Call in 1 day(s).   Why: for post stroke follow up Contact information: [address]     [place] [phone] [phone]                Signed: Bary Leriche 06/10/2020, 10:59 PM

## 2020-06-14 ENCOUNTER — Telehealth: Payer: Self-pay | Admitting: *Deleted

## 2020-06-14 NOTE — Telephone Encounter (Signed)
Claiborne Billings  PT [facility] called and requested PT POC 1wk9.  Approval given.  Brandon OT [facility] also called for POC 1wk4. Approval given

## 2020-06-15 NOTE — Progress Notes (Signed)
Speech Language Pathology Discharge Summary  Patient Details  Name: Jeffery Roach MRN: [id number] Date of Birth: Apr 22, 1927   Patient has met 2 of 3 long term goals.  Patient to discharge at [facility] level.   Reasons goals not met: Patient requires Min A visual cues for orientation to place, time and situation.   Clinical Impression/Discharge Summary: Patient has made functional gains and has met 2 of 3 LTGs this admission. Currently, patient is consuming regular textures with thin liquids with minimal overt s/s of aspiration and Mod I for use of swallowing compensatory strategies. Patient continues to require overall Min A verbal and visual cues for recall of orientation information. Patient remains moderate-severely dysarthric (from previous stroke) and requires overall Max A multimodal cues for use of speech intelligibility strategies at the sentence level. However, at the word level, patient is ~50% intelligible with supervision level verbal cues. Patient also demonstrates intermittent distortions and substitutions of phonemes. Patient education is complete and patient will discharge home with family. Patient would benefit from f/u SLP services to maximize his cognitive functioning and functional communication in order to reduce caregiver burden.   Care Partner:  Caregiver Able to Provide Assistance: Yes  Type of Caregiver Assistance: Physical;Cognitive  Recommendation:  24 hour supervision/assistance;Home Health SLP  Rationale for SLP Follow Up: Maximize functional communication;Reduce caregiver burden;Maximize cognitive function and independence   Equipment: N/A   Reasons for discharge: Discharged from hospital   Patient/Family Agrees with Progress Made and Goals Achieved: Yes    [place], [place] 06/15/2020, 6:11 AM

## 2020-06-16 ENCOUNTER — Telehealth: Payer: Self-pay

## 2020-06-16 NOTE — Telephone Encounter (Signed)
Shelda Pal, SLP from [facility] called requesting HHST 1wk1, 0wk1, 1wk6. Approved orders.

## 2020-07-28 ENCOUNTER — Inpatient Hospital Stay: Payer: Medicare Other | Admitting: Physical Medicine & Rehabilitation
